# Patient Record
Sex: Female | Born: 1985 | Hispanic: No | Marital: Married | Smoking: Never smoker
Health system: Southern US, Community
[De-identification: ages and names within clinical notes are randomized; demographics above are authoritative.]

## PROBLEM LIST (undated history)

## (undated) ENCOUNTER — Inpatient Hospital Stay (HOSPITAL_COMMUNITY): Payer: Self-pay

## (undated) DIAGNOSIS — O139 Gestational [pregnancy-induced] hypertension without significant proteinuria, unspecified trimester: Secondary | ICD-10-CM

## (undated) DIAGNOSIS — I10 Essential (primary) hypertension: Secondary | ICD-10-CM

## (undated) DIAGNOSIS — G44209 Tension-type headache, unspecified, not intractable: Secondary | ICD-10-CM

## (undated) DIAGNOSIS — G43109 Migraine with aura, not intractable, without status migrainosus: Principal | ICD-10-CM

## (undated) HISTORY — DX: Gestational (pregnancy-induced) hypertension without significant proteinuria, unspecified trimester: O13.9

## (undated) HISTORY — DX: Tension-type headache, unspecified, not intractable: G44.209

## (undated) HISTORY — PX: MULTIPLE TOOTH EXTRACTIONS: SHX2053

## (undated) HISTORY — DX: Migraine with aura, not intractable, without status migrainosus: G43.109

---

## 2012-02-16 ENCOUNTER — Emergency Department (HOSPITAL_COMMUNITY)
Admission: EM | Admit: 2012-02-16 | Discharge: 2012-02-17 | Disposition: A | Discharge status: Home / Self Care | Payer: Medicaid Other | Attending: Emergency Medicine | Admitting: Emergency Medicine

## 2012-02-16 ENCOUNTER — Emergency Department (HOSPITAL_COMMUNITY): Payer: Medicaid Other

## 2012-02-16 ENCOUNTER — Encounter (HOSPITAL_COMMUNITY): Payer: Self-pay | Admitting: Emergency Medicine

## 2012-02-16 DIAGNOSIS — M545 Low back pain, unspecified: Secondary | ICD-10-CM | POA: Insufficient documentation

## 2012-02-16 DIAGNOSIS — M79609 Pain in unspecified limb: Secondary | ICD-10-CM | POA: Insufficient documentation

## 2012-02-16 DIAGNOSIS — R109 Unspecified abdominal pain: Secondary | ICD-10-CM | POA: Insufficient documentation

## 2012-02-16 DIAGNOSIS — M533 Sacrococcygeal disorders, not elsewhere classified: Secondary | ICD-10-CM | POA: Insufficient documentation

## 2012-02-16 DIAGNOSIS — K5900 Constipation, unspecified: Secondary | ICD-10-CM | POA: Insufficient documentation

## 2012-02-16 LAB — CBC
HCT: 38.6 % (ref 36.0–46.0)
MCHC: 35.5 g/dL (ref 30.0–36.0)
MCV: 82.1 fL (ref 78.0–100.0)
Platelets: 273 10*3/uL (ref 150–400)
RDW: 12.2 % (ref 11.5–15.5)
WBC: 8.2 10*3/uL (ref 4.0–10.5)

## 2012-02-16 LAB — URINALYSIS, ROUTINE W REFLEX MICROSCOPIC
Nitrite: NEGATIVE
Protein, ur: NEGATIVE mg/dL
Specific Gravity, Urine: 1.013 (ref 1.005–1.030)
Urobilinogen, UA: 0.2 mg/dL (ref 0.0–1.0)

## 2012-02-16 LAB — BASIC METABOLIC PANEL
CO2: 22 mEq/L (ref 19–32)
Calcium: 9.2 mg/dL (ref 8.4–10.5)
Creatinine, Ser: 0.66 mg/dL (ref 0.50–1.10)
GFR calc Af Amer: >90 mL/min (ref >90)
GFR calc non Af Amer: >90 mL/min (ref >90)
Sodium: 140 mEq/L (ref 135–145)

## 2012-02-16 LAB — DIFFERENTIAL
Basophils Absolute: 0 10*3/uL (ref 0.0–0.1)
Basophils Relative: 0 % (ref 0–1)
Eosinophils Relative: 9 % — ABNORMAL HIGH (ref 0–5)
Lymphocytes Relative: 44 % (ref 12–46)
Monocytes Absolute: 0.5 10*3/uL (ref 0.1–1.0)
Neutro Abs: 3.3 10*3/uL (ref 1.7–7.7)

## 2012-02-16 LAB — PREGNANCY, URINE: Preg Test, Ur: NEGATIVE

## 2012-02-16 MED ORDER — KETOROLAC TROMETHAMINE 60 MG/2ML IM SOLN
60.0000 mg | Freq: Once | INTRAMUSCULAR | Status: AC
  Administered 2012-02-16: 60 mg via INTRAMUSCULAR
  Filled 2012-02-16: qty 2

## 2012-02-16 MED ORDER — METHOCARBAMOL 500 MG PO TABS: 500.0000 mg | ORAL_TABLET | Freq: Two times a day (BID) | ORAL | Status: DC

## 2012-02-16 MED ORDER — TRAMADOL HCL 50 MG PO TABS: 50.0000 mg | ORAL_TABLET | Freq: Four times a day (QID) | ORAL | Status: DC | PRN

## 2012-02-16 MED ORDER — POLYETHYLENE GLYCOL 3350 17 GM/SCOOP PO POWD: 17.0000 g | Freq: Every day | ORAL | Status: AC

## 2012-02-16 MED ORDER — POLYETHYLENE GLYCOL 3350 17 GM/SCOOP PO POWD: 17.0000 g | Freq: Every day | ORAL | Status: DC

## 2012-02-16 MED ORDER — TRAMADOL HCL 50 MG PO TABS: 50.0000 mg | ORAL_TABLET | Freq: Four times a day (QID) | ORAL | Status: AC | PRN

## 2012-02-16 MED ORDER — IOHEXOL 300 MG/ML  SOLN
100.0000 mL | Freq: Once | INTRAMUSCULAR | Status: AC | PRN
  Administered 2012-02-16: 100 mL via INTRAVENOUS

## 2012-02-16 MED ORDER — IBUPROFEN 600 MG PO TABS: 600.0000 mg | ORAL_TABLET | Freq: Four times a day (QID) | ORAL | Status: DC | PRN

## 2012-02-16 MED ORDER — METHOCARBAMOL 500 MG PO TABS
500.0000 mg | ORAL_TABLET | Freq: Once | ORAL | Status: AC
  Administered 2012-02-16: 500 mg via ORAL
  Filled 2012-02-16: qty 1

## 2012-02-16 MED ORDER — OXYCODONE-ACETAMINOPHEN 5-325 MG PO TABS
1.0000 | ORAL_TABLET | Freq: Once | ORAL | Status: AC
  Administered 2012-02-16: 1 via ORAL
  Filled 2012-02-16: qty 1

## 2012-02-16 MED ORDER — IBUPROFEN 600 MG PO TABS: 600.0000 mg | ORAL_TABLET | Freq: Four times a day (QID) | ORAL | Status: AC | PRN

## 2012-02-16 MED ORDER — METHOCARBAMOL 500 MG PO TABS: 500.0000 mg | ORAL_TABLET | Freq: Two times a day (BID) | ORAL | Status: AC

## 2012-02-16 NOTE — ED Notes (Signed)
Called CT, waiting on lab results.

## 2012-02-16 NOTE — ED Provider Notes (Signed)
History     CSN: [id number]  Arrival date & time 02/16/12  1502   First MD Initiated Contact with Patient 02/16/12 1627      Chief Complaint  Patient presents with  . Abdominal Pain  . Back Pain    (Consider location/radiation/quality/duration/timing/severity/associated sxs/prior treatment) HPI Pt c/o lower back pain in band like pattern that radiates down the back of both lower ext to post thigh. Pain started 5 days ago and she states she has been seen once in an ED for same complaint. She is currently not taking any pain meds at home. No fever, chills, weakness, sensory loss, urinary symptoms, incontinence History reviewed. No pertinent past medical history.  History reviewed. No pertinent past surgical history.  History reviewed. No pertinent family history.  History  Substance Use Topics  . Smoking status: Never Smoker   . Smokeless tobacco: Not on file  . Alcohol Use: No    OB History    Grav Para Term Preterm Abortions TAB SAB Ect Mult Living                  Review of Systems  Constitutional: Negative for fever and chills.  Gastrointestinal: Negative for nausea, vomiting and abdominal pain.  Genitourinary: Negative for dysuria, hematuria, flank pain and difficulty urinating.  Musculoskeletal: Positive for back pain.  Skin: Negative for rash and wound.  Neurological: Negative for weakness and numbness.    Allergies  Review of patient's allergies indicates no known allergies.  Home Medications   Current Outpatient Rx  Name Route Sig Dispense Refill  . PRESCRIPTION MEDICATION Intramuscular Inject 1 Syringe into the muscle once. Injection for pain at MD's office    . IBUPROFEN 600 MG PO TABS Oral Take 1 tablet (600 mg total) by mouth every 6 (six) hours as needed for pain. 30 tablet 0  . METHOCARBAMOL 500 MG PO TABS Oral Take 1 tablet (500 mg total) by mouth 2 (two) times daily. 20 tablet 0  . POLYETHYLENE GLYCOL 3350 PO POWD Oral Take 17 g by mouth daily.  255 g 0  . TRAMADOL HCL 50 MG PO TABS Oral Take 1 tablet (50 mg total) by mouth every 6 (six) hours as needed for pain. 15 tablet 0    BP 137/96  Pulse 79  Temp(Src) 98.3 F (36.8 C) (Oral)  Resp 14  SpO2 98%  Physical Exam  Nursing note and vitals reviewed. Constitutional: She is oriented to person, place, and time. She appears well-developed and well-nourished. No distress.  HENT:  Head: Normocephalic and atraumatic.  Mouth/Throat: Oropharynx is clear and moist.  Eyes: EOM are normal. Pupils are equal, round, and reactive to light.  Neck: Normal range of motion. Neck supple.  Cardiovascular: Normal rate and regular rhythm.   Pulmonary/Chest: Effort normal and breath sounds normal. No respiratory distress. She has no wheezes. She has no rales.  Abdominal: Soft. Bowel sounds are normal. There is no tenderness. There is no rebound and no guarding.  Musculoskeletal: Normal range of motion. She exhibits no edema. Tenderness: mild tenderness ttp over lower back in lumbar and sacroilliac region. no focality. Negative straight leg raise.  Neurological: She is alert and oriented to person, place, and time.       5/5 motor, sensation intact, no saddle anesthesia  Skin: Skin is warm and dry. No rash noted. No erythema.  Psychiatric: She has a normal mood and affect. Her behavior is normal.    ED Course  Procedures (including critical care  time)  Labs Reviewed  DIFFERENTIAL - Abnormal; Notable for the following:    Neutrophils Relative 41 (*)    Eosinophils Relative 9 (*)    All other components within normal limits  BASIC METABOLIC PANEL - Abnormal; Notable for the following:    Potassium 3.2 (*)    All other components within normal limits  URINALYSIS, ROUTINE W REFLEX MICROSCOPIC  PREGNANCY, URINE  CBC  LAB REPORT - SCANNED   Ct Abdomen Pelvis W Contrast  02/16/2012  *RADIOLOGY REPORT*  Clinical Data: Lower abdominal pain  CT ABDOMEN AND PELVIS WITH CONTRAST  Technique:   Multidetector CT imaging of the abdomen and pelvis was performed following the standard protocol during bolus administration of intravenous contrast.  Contrast: OMNIPAQUE IOHEXOL 300 MG/ML  SOLN  Comparison: None.  Findings: Limited images through the lung bases demonstrate no significant appreciable abnormality. The heart size is within normal limits. No pleural or pericardial effusion.  Calcified granuloma right middle lobe.  Hypoattenuation adjacent to the falciform ligament is likely focal fat are very perfusion.  Unremarkable biliary system, spleen, pancreas, adrenal glands.  Symmetric renal enhancement.  No hydronephrosis or hydroureter.  No bowel obstruction.  No CT evidence for colitis.  Appendix within normal limits.  Thin-walled bladder.  Nonspecific appearance to the uterus and adnexa with physiologic cyst on the left.  There may be a corpus luteal cyst on the right.  No free intraperitoneal air or fluid.  Normal caliber vasculature.  No acute osseous abnormality.  IMPRESSION: No acute abnormality identified by CT.  Original Report Authenticated By: Waneta Martins, M.D.     1. Low back pain radiating to both legs   2. Constipation       MDM  Pt resting comfortably. Pain improved. Will d/c with symptomatic rx for symptomatic relief. No concerning findings for back pain.   After pt initially d/c'd, pt tells nurse she is having RLQ pain and has history of appendicitis without appendectomy. Though given the chronic nature of the pain and low likelihood of surgical issue, will get basic labs and CT abd. If neg will proceed with d/c      Loren Racer, MD 02/18/12 [phone]

## 2012-02-16 NOTE — ED Notes (Signed)
NP to bedside

## 2012-02-16 NOTE — ED Notes (Addendum)
Pt c/o lower back pain with pain in lower abd and bilateral legs; pt sts seen for same in past; pt sts trouble walking

## 2012-02-16 NOTE — ED Notes (Signed)
Pt. Reports low back pain and lower abdominal pain that radiates down legs. Rebound tenderness in lower right quadrant. Denies N/V/D, pain with urination. States this started last Thursday and went to urgent care Monday where she was treated but reports persisting pain.

## 2012-02-16 NOTE — ED Notes (Signed)
Prescriptions X4 given with discharge instructions. 

## 2012-02-16 NOTE — ED Notes (Signed)
Per visitors, pt. Has a history of appendicitis and was going to have appendix removed before she moved here, but it was never done. States she is complaining of similar symptoms. Will notify doctor.

## 2012-02-16 NOTE — ED Notes (Signed)
Pt. Ambulatory to bathroom with moderate assistance. Urine sample collected and labeled. Pt. Changed into a gown and resting comfortably. Call light within reach.

## 2012-02-16 NOTE — Discharge Instructions (Signed)
Back Pain, Adult  Low back pain is very common. About 1 in 5 people have back pain.The cause of low back pain is rarely dangerous. The pain often gets better over time.About half of people with a sudden onset of back pain feel better in just 2 weeks. About 8 in 10 people feel better by 6 weeks.   CAUSES  Some common causes of back pain include:   Strain of the muscles or ligaments supporting the spine.   Wear and tear (degeneration) of the spinal discs.   Arthritis.   Direct injury to the back.  DIAGNOSIS  Most of the time, the direct cause of low back pain is not known.However, back pain can be treated effectively even when the exact cause of the pain is unknown.Answering your caregiver's questions about your overall health and symptoms is one of the most accurate ways to make sure the cause of your pain is not dangerous. If your caregiver needs more information, he or she may order lab work or imaging tests (X-rays or MRIs).However, even if imaging tests show changes in your back, this usually does not require surgery.  HOME CARE INSTRUCTIONS  For many people, back pain returns.Since low back pain is rarely dangerous, it is often a condition that people can learn to manageon their own.    Remain active. It is stressful on the back to sit or stand in one place. Do not sit, drive, or stand in one place for more than 30 minutes at a time. Take short walks on level surfaces as soon as pain allows.Try to increase the length of time you walk each day.   Do not stay in bed.Resting more than 1 or 2 days can delay your recovery.   Do not avoid exercise or work.Your body is made to move.It is not dangerous to be active, even though your back may hurt.Your back will likely heal faster if you return to being active before your pain is gone.   Pay attention to your body when you bend and lift. Many people have less discomfortwhen lifting if they bend their knees, keep the load close to their  bodies,and avoid twisting. Often, the most comfortable positions are those that put less stress on your recovering back.   Find a comfortable position to sleep. Use a firm mattress and lie on your side with your knees slightly bent. If you lie on your back, put a pillow under your knees.   Only take over-the-counter or prescription medicines as directed by your caregiver. Over-the-counter medicines to reduce pain and inflammation are often the most helpful.Your caregiver may prescribe muscle relaxant drugs.These medicines help dull your pain so you can more quickly return to your normal activities and healthy exercise.   Put ice on the injured area.   Put ice in a plastic bag.   Place a towel between your skin and the bag.   Leave the ice on for 15 to 20 minutes, 3 to 4 times a day for the first 2 to 3 days. After that, ice and heat may be alternated to reduce pain and spasms.   Ask your caregiver about trying back exercises and gentle massage. This may be of some benefit.   Avoid feeling anxious or stressed.Stress increases muscle tension and can worsen back pain.It is important to recognize when you are anxious or stressed and learn ways to manage it.Exercise is a great option.  SEEK MEDICAL CARE IF:   You have pain that is not   relieved with rest or medicine.   You have pain that does not improve in 1 week.   You have new symptoms.   You are generally not feeling well.  SEEK IMMEDIATE MEDICAL CARE IF:    You have pain that radiates from your back into your legs.   You develop new bowel or bladder control problems.   You have unusual weakness or numbness in your arms or legs.   You develop nausea or vomiting.   You develop abdominal pain.   You feel faint.  Document Released: 09/13/2005 Document Revised: 09/02/2011 Document Reviewed: 02/01/2011  Exit[employer] Patient Information 2012 [employer]  Constipation in Adults  Constipation is having fewer than 2 bowel movements per week. Usually,  the stools are hard. As we grow older, constipation is more common. If you try to fix constipation with laxatives, the problem may get worse. This is because laxatives taken over a long period of time make the colon muscles weaker. A low-fiber diet, not taking in enough fluids, and taking some medicines may make these problems worse.  MEDICATIONS THAT MAY CAUSE CONSTIPATION   Water pills (diuretics).   Calcium channel blockers (used to control blood pressure and for the heart).   Certain pain medicines (narcotics).   Anticholinergics.   Anti-inflammatory agents.   Antacids that contain aluminum.  DISEASES THAT CONTRIBUTE TO CONSTIPATION   Diabetes.   Parkinson's disease.   Dementia.   Stroke.   Depression.   Illnesses that cause problems with salt and water metabolism.  HOME CARE INSTRUCTIONS    Constipation is usually best cared for without medicines. Increasing dietary fiber and eating more fruits and vegetables is the best way to manage constipation.   Slowly increase fiber intake to 25 to 38 grams per day. Whole grains, fruits, vegetables, and legumes are good sources of fiber. A dietitian can further help you incorporate high-fiber foods into your diet.   Drink enough water and fluids to keep your urine clear or pale yellow.   A fiber supplement may be added to your diet if you cannot get enough fiber from foods.   Increasing your activities also helps improve regularity.   Suppositories, as suggested by your caregiver, will also help. If you are using antacids, such as aluminum or calcium containing products, it will be helpful to switch to products containing magnesium if your caregiver says it is okay.   If you have been given a liquid injection (enema) today, this is only a temporary measure. It should not be relied on for treatment of longstanding (chronic) constipation.   Stronger measures, such as magnesium sulfate, should be avoided if possible. This may cause uncontrollable  diarrhea. Using magnesium sulfate may not allow you time to make it to the bathroom.  SEEK IMMEDIATE MEDICAL CARE IF:    There is bright red blood in the stool.   The constipation stays for more than 4 days.   There is belly (abdominal) or rectal pain.   You do not seem to be getting better.   You have any questions or concerns.  MAKE SURE YOU:    Understand these instructions.   Will watch your condition.   Will get help right away if you are not doing well or get worse.  Document Released: 06/11/2004 Document Revised: 09/02/2011 Document Reviewed: 08/17/2011  [employer] Patient Information 2012 [employer].

## 2012-08-18 ENCOUNTER — Emergency Department (INDEPENDENT_AMBULATORY_CARE_PROVIDER_SITE_OTHER)
Admission: EM | Admit: 2012-08-18 | Discharge: 2012-08-18 | Disposition: A | Discharge status: Home / Self Care | Payer: Self-pay | Source: Home / Self Care | Attending: Emergency Medicine | Admitting: Emergency Medicine

## 2012-08-18 ENCOUNTER — Encounter (HOSPITAL_COMMUNITY): Payer: Self-pay | Admitting: Emergency Medicine

## 2012-08-18 DIAGNOSIS — M549 Dorsalgia, unspecified: Secondary | ICD-10-CM

## 2012-08-18 DIAGNOSIS — Z3201 Encounter for pregnancy test, result positive: Secondary | ICD-10-CM

## 2012-08-18 DIAGNOSIS — Z3490 Encounter for supervision of normal pregnancy, unspecified, unspecified trimester: Secondary | ICD-10-CM

## 2012-08-18 DIAGNOSIS — R110 Nausea: Secondary | ICD-10-CM

## 2012-08-18 LAB — POCT PREGNANCY, URINE: Preg Test, Ur: POSITIVE — AB

## 2012-08-18 LAB — POCT URINALYSIS DIP (DEVICE)
Glucose, UA: NEGATIVE mg/dL
Leukocytes, UA: NEGATIVE
Nitrite: NEGATIVE
Protein, ur: NEGATIVE mg/dL
Urobilinogen, UA: 0.2 mg/dL (ref 0.0–1.0)

## 2012-08-18 MED ORDER — PRENATAL LOW IRON 27-0.8 MG PO TABS: 1.0000 | ORAL_TABLET | Freq: Every morning | ORAL | Status: DC

## 2012-08-18 NOTE — ED Notes (Signed)
Pt c/o poss preg... LMP was on 07/11/12... Sx include: lower back pain, nauseas, vomiting, headaches... Denies: diarrhea... Pt is alert w/no signs of distress.

## 2012-08-18 NOTE — ED Provider Notes (Signed)
History     CSN: [id number]  Arrival date & time 08/18/12  0915   First MD Initiated Contact with Patient 08/18/12 [phone]      Chief Complaint  Patient presents with  . Possible Pregnancy    (Consider location/radiation/quality/duration/timing/severity/associated sxs/prior treatment) HPI Comments: Patient presents urgent care complaining of ongoing back pains. They're not constant they come and go. She is also concerned as she might be pregnant her last menstrual. Was on October 15. She is somewhat nauseous in the mornings and have vomited a couple times. Denies any fevers, burning with urination, weight loss. Patient describes she has one child and had some blood pressure issues during pregnancy. She has not had high blood pressure since her delivery. Patient denies any other symptomatology such as abdominal pain, visual disturbances, weakness or paresthesias.  The history is provided by the patient.    Past Medical History  Diagnosis Date  . Hypertension     History reviewed. No pertinent past surgical history.  No family history on file.  History  Substance Use Topics  . Smoking status: Never Smoker   . Smokeless tobacco: Not on file  . Alcohol Use: No    OB History    Grav Para Term Preterm Abortions TAB SAB Ect Mult Living                  Review of Systems  Constitutional: Negative for chills, activity change and appetite change.  Respiratory: Negative for shortness of breath.   Cardiovascular: Negative for chest pain.  Gastrointestinal: Positive for nausea. Negative for vomiting, abdominal pain and diarrhea.  Genitourinary: Negative for urgency, frequency, flank pain, vaginal discharge, vaginal pain and pelvic pain.  Musculoskeletal: Positive for back pain.  Skin: Negative for rash.  Neurological: Negative for dizziness and headaches.    Allergies  Review of patient's allergies indicates no known allergies.  Home Medications   Current Outpatient Rx    Name  Route  Sig  Dispense  Refill  . PRENATAL LOW IRON 27-0.8 MG PO TABS   Oral   Take 1 tablet by mouth every morning.   30 each   0   . PRESCRIPTION MEDICATION   Intramuscular   Inject 1 Syringe into the muscle once. Injection for pain at MD's office           BP 122/70  Pulse 70  Temp 98.2 F (36.8 C) (Oral)  Resp 18  SpO2 100%  LMP 07/11/2012  Physical Exam  Nursing note and vitals reviewed. Constitutional: She appears well-developed and well-nourished.  HENT:  Head: Normocephalic.  Eyes: Conjunctivae normal are normal. No scleral icterus.  Pulmonary/Chest: Effort normal and breath sounds normal. She has no decreased breath sounds.  Abdominal: Soft. She exhibits no mass. There is no tenderness. There is no rebound and no guarding.  Genitourinary: No vaginal discharge found.  Musculoskeletal:       Back:  Neurological: She is alert.  Skin: No rash noted. No erythema.    ED Course  Procedures (including critical care time)  Labs Reviewed  POCT PREGNANCY, URINE - Abnormal; Notable for the following:    Preg Test, Ur POSITIVE (*)     All other components within normal limits  POCT URINALYSIS DIP (DEVICE)   No results found.   1. Pregnancy      Informational ( non-diagnostic) transabdominal ultrasound revealing a suspected intrauterine pregnancy- early less than 6 weeks MDM   Patient was started on prenatal vitamins and provider referral  to establish prenatal care at [facility] outpatient clinic. Anticipatory guidance about what symptoms should warrant her to [facility]. Patient understands and will call provider number to establish prenatal care.       Jimmie Molly, MD 08/18/12 1218

## 2012-08-23 ENCOUNTER — Encounter (HOSPITAL_COMMUNITY): Payer: Self-pay | Admitting: *Deleted

## 2012-08-23 ENCOUNTER — Inpatient Hospital Stay (HOSPITAL_COMMUNITY)
Admission: AD | Admit: 2012-08-23 | Discharge: 2012-08-23 | Disposition: A | Discharge status: Home / Self Care | Payer: Medicaid Other | Source: Ambulatory Visit | Attending: Family Medicine | Admitting: Family Medicine

## 2012-08-23 ENCOUNTER — Inpatient Hospital Stay (HOSPITAL_COMMUNITY): Payer: Medicaid Other

## 2012-08-23 DIAGNOSIS — O99891 Other specified diseases and conditions complicating pregnancy: Secondary | ICD-10-CM | POA: Insufficient documentation

## 2012-08-23 DIAGNOSIS — R42 Dizziness and giddiness: Secondary | ICD-10-CM | POA: Insufficient documentation

## 2012-08-23 DIAGNOSIS — Z3490 Encounter for supervision of normal pregnancy, unspecified, unspecified trimester: Secondary | ICD-10-CM

## 2012-08-23 DIAGNOSIS — R109 Unspecified abdominal pain: Secondary | ICD-10-CM | POA: Insufficient documentation

## 2012-08-23 DIAGNOSIS — O26899 Other specified pregnancy related conditions, unspecified trimester: Secondary | ICD-10-CM

## 2012-08-23 LAB — URINALYSIS, ROUTINE W REFLEX MICROSCOPIC
Leukocytes, UA: NEGATIVE
Nitrite: NEGATIVE
Specific Gravity, Urine: <1.005 — ABNORMAL LOW (ref 1.005–1.030)
pH: 6 (ref 5.0–8.0)

## 2012-08-23 LAB — CBC
HCT: 35 % — ABNORMAL LOW (ref 36.0–46.0)
Hemoglobin: 12.2 g/dL (ref 12.0–15.0)
MCH: 28.4 pg (ref 26.0–34.0)
MCHC: 34.9 g/dL (ref 30.0–36.0)
MCV: 81.6 fL (ref 78.0–100.0)

## 2012-08-23 LAB — HCG, QUANTITATIVE, PREGNANCY: hCG, Beta Chain, Quant, S: 45922 m[IU]/mL — ABNORMAL HIGH (ref <5)

## 2012-08-23 LAB — WET PREP, GENITAL: Yeast Wet Prep HPF POC: NONE SEEN

## 2012-08-23 NOTE — MAU Note (Signed)
Abdominal pain since 11/17. Dizzy "for a long time." States she is able to eat and drink. States she has been seen at [facility].

## 2012-08-23 NOTE — MAU Provider Note (Signed)
Chart reviewed and agree with management and plan.  

## 2012-08-23 NOTE — MAU Note (Signed)
Pt states she is pregnant, has had a lot of dizziness, has vomited x2 since beginning of pregnancy. Denies abnormal vaginal discharge or bleeding. Notes lower abd pain at suprapubic area.

## 2012-08-23 NOTE — MAU Provider Note (Signed)
History     CSN: [id number]  Arrival date and time: 08/23/12 4098   First Provider Initiated Contact with Patient 08/23/12 [phone]      Chief Complaint  Patient presents with  . Abdominal Pain   HPI Carrie Duncan 26 y.o. [redacted]w[redacted]d  Client comes to MAU today with abdominal pain and dizziness with this pregnancy.  Was seen at the ER last week and an informal ultrasound was done.  Is taking prenatal vitamins.    OB History    Grav Para Term Preterm Abortions TAB SAB Ect Mult Living   3 1 1  1 1    1       Past Medical History  Diagnosis Date  . Hypertension     Past Surgical History  Procedure Date  . No past surgeries     History reviewed. No pertinent family history.  History  Substance Use Topics  . Smoking status: Never Smoker   . Smokeless tobacco: Never Used  . Alcohol Use: No    Allergies: No Known Allergies  Prescriptions prior to admission  Medication Sig Dispense Refill  . acetaminophen (TYLENOL) 500 MG tablet Take 1,000 mg by mouth every 6 (six) hours as needed. For pain      . Prenatal Vit-Fe Fumarate-FA (PRENATAL MULTIVITAMIN) TABS Take 1 tablet by mouth daily.        Review of Systems  Constitutional: Negative for fever.  Gastrointestinal: Positive for nausea and abdominal pain. Negative for vomiting, diarrhea and constipation.  Genitourinary:       No vaginal discharge. No vaginal bleeding. No dysuria.  Neurological: Positive for dizziness.   Physical Exam   Blood pressure 127/75, pulse 70, temperature 97.8 F (36.6 C), temperature source Oral, resp. rate 18, height 5\' 2"  (1.575 m), weight 60.328 kg (133 lb), last menstrual period 07/11/2012, SpO2 100.00%.  Physical Exam  Nursing note and vitals reviewed. Constitutional: She is oriented to person, place, and time. She appears well-developed and well-nourished.  HENT:  Head: Normocephalic.  Eyes: EOM are normal.  Neck: Neck supple.  GI: Soft. Bowel sounds are normal. There is tenderness.  There is no rebound and no guarding.  Genitourinary:       Speculum exam: Vulva - negative Vagina - minimal creamy discharge, no odor Cervix - No contact bleeding Bimanual exam: Cervix closed Uterus  tender, 6 week size Adnexa mildly tender with palpation, no masses bilaterally GC/Chlam, wet prep done Chaperone present for exam.  Musculoskeletal: Normal range of motion.  Neurological: She is alert and oriented to person, place, and time.  Skin: Skin is warm and dry.  Psychiatric: She has a normal mood and affect.    MAU Course  Procedures Results for orders placed during the hospital encounter of 08/23/12 (from the past 24 hour(s))  URINALYSIS, ROUTINE W REFLEX MICROSCOPIC     Status: Abnormal   Collection Time   08/23/12  8:40 AM      Component Value Range   Color, Urine YELLOW  YELLOW   APPearance CLEAR  CLEAR   Specific Gravity, Urine <1.005 (*) 1.005 - 1.030   pH 6.0  5.0 - 8.0   Glucose, UA NEGATIVE  NEGATIVE mg/dL   Hgb urine dipstick NEGATIVE  NEGATIVE   Bilirubin Urine NEGATIVE  NEGATIVE   Ketones, ur NEGATIVE  NEGATIVE mg/dL   Protein, ur NEGATIVE  NEGATIVE mg/dL   Urobilinogen, UA 0.2  0.0 - 1.0 mg/dL   Nitrite NEGATIVE  NEGATIVE   Leukocytes, UA NEGATIVE  NEGATIVE  WET PREP, GENITAL     Status: Abnormal   Collection Time   08/23/12 10:08 AM      Component Value Range   Yeast Wet Prep HPF POC NONE SEEN  NONE SEEN   Trich, Wet Prep NONE SEEN  NONE SEEN   Clue Cells Wet Prep HPF POC NONE SEEN  NONE SEEN   WBC, Wet Prep HPF POC FEW (*) NONE SEEN  HCG, QUANTITATIVE, PREGNANCY     Status: Abnormal   Collection Time   08/23/12 10:20 AM      Component Value Range   hCG, Beta Chain, Sharene Butters, [place] [address] (*) <5 mIU/mL  CBC     Status: Abnormal   Collection Time   08/23/12 10:20 AM      Component Value Range   WBC 6.3  4.0 - 10.5 K/uL   RBC 4.29  3.87 - 5.11 MIL/uL   Hemoglobin 12.2  12.0 - 15.0 g/dL   HCT 60.4 (*) 54.0 - 98.1 %   MCV 81.6  78.0 - 100.0 fL   MCH  28.4  26.0 - 34.0 pg   MCHC 34.9  30.0 - 36.0 g/dL   RDW 19.1  47.8 - 29.5 %   Platelets 262  150 - 400 K/uL  ABO/RH     Status: Normal (Preliminary result)   Collection Time   08/23/12 10:20 AM      Component Value Range   ABO/RH(D) O POS     MDM Diet review with client - eating high carb diet.  Suspect she needs to eat small amounts every 2-3 hours and eat protein with each meal to decrease dizziness.  Discussed some dizziness is normal in pregnancy especially when moving quickly.  Advised to move slowly with position changes.  Assessment and Plan  abd pain in pregnancy  Plan Eat every 2-3 hours with protein (eggs, meat, cheese, yogurt, milk, beans, peanut butter or nuts) every time you eat. Change positions slowly Drink at least 8 8-oz glasses of water every day. Take Tylenol 325 mg 2 tablets by mouth every 4 hours if needed for pain. Begin prenatal care as soon as possible Continue the prenatal vitamins that you have - one every day.  Rupert Azzara 08/23/2012, 10:23 AM

## 2012-08-25 LAB — GC/CHLAMYDIA PROBE AMP
CT Probe RNA: NEGATIVE
GC Probe RNA: NEGATIVE

## 2012-09-27 NOTE — L&D Delivery Note (Addendum)
Delivery Note At 7:07 PM a viable female was delivered via Vaginal, Spontaneous Delivery (Presentation: Left Occiput Anterior).  APGAR: 3, 9; weight .   Placenta status: Intact, Spontaneous.  Cord: 3 vessels with the following complications: None.  Cord pH: Pending  Anesthesia: None  Episiotomy: None Lacerations: labial right Suture Repair: None Est. Blood Loss (mL): 250  Mom to postpartum.  Baby to nursery-stable.  Tawana Scale 04/13/2013, 7:33 PM

## 2012-09-30 ENCOUNTER — Emergency Department (HOSPITAL_COMMUNITY)
Admission: EM | Admit: 2012-09-30 | Discharge: 2012-09-30 | Disposition: A | Discharge status: Home / Self Care | Payer: Medicaid Other | Source: Home / Self Care

## 2012-09-30 ENCOUNTER — Encounter (HOSPITAL_COMMUNITY): Payer: Self-pay | Admitting: Emergency Medicine

## 2012-09-30 DIAGNOSIS — R55 Syncope and collapse: Secondary | ICD-10-CM

## 2012-09-30 DIAGNOSIS — Z3490 Encounter for supervision of normal pregnancy, unspecified, unspecified trimester: Secondary | ICD-10-CM

## 2012-09-30 DIAGNOSIS — L03011 Cellulitis of right finger: Secondary | ICD-10-CM

## 2012-09-30 DIAGNOSIS — L03019 Cellulitis of unspecified finger: Secondary | ICD-10-CM

## 2012-09-30 MED ORDER — CEPHALEXIN 500 MG PO CAPS: 500.0000 mg | ORAL_CAPSULE | Freq: Three times a day (TID) | ORAL | Status: DC

## 2012-09-30 NOTE — ED Notes (Signed)
Pt c/o right thumb pain around cuticle x 3 weeks. Pt denies injury. Thumb is slightly swollen at cuticle. Pt has not tried any meds/treatment.

## 2012-09-30 NOTE — ED Notes (Signed)
Waiting discharge papers 

## 2012-09-30 NOTE — ED Provider Notes (Signed)
History     CSN: [id number]  Arrival date & time 09/30/12  1343   None     Chief Complaint  Patient presents with  . Finger Injury    right thumb pain  x 3 wks. around cuticle     (Consider location/radiation/quality/duration/timing/severity/associated sxs/prior treatment) HPI Comments: 27 year old female who presents with soreness around the base of the right thumbnail. This began approximately 3 weeks ago. She denies any known trauma. Denies symptoms involving the other digits and denies constitutional/systemic symptoms. She is pregnant.   Past Medical History  Diagnosis Date  . Hypertension     Past Surgical History  Procedure Date  . No past surgeries     History reviewed. No pertinent family history.  History  Substance Use Topics  . Smoking status: Never Smoker   . Smokeless tobacco: Never Used  . Alcohol Use: No    OB History    Grav Para Term Preterm Abortions TAB SAB Ect Mult Living   3 1 1  1 1    1       Review of Systems  All other systems reviewed and are negative.    Allergies  Review of patient's allergies indicates no known allergies.  Home Medications   Current Outpatient Rx  Name  Route  Sig  Dispense  Refill  . PRENATAL MULTIVITAMIN CH   Oral   Take 1 tablet by mouth daily.         . ACETAMINOPHEN 500 MG PO TABS   Oral   Take 1,000 mg by mouth every 6 (six) hours as needed. For pain         . CEPHALEXIN 500 MG PO CAPS   Oral   Take 1 capsule (500 mg total) by mouth 3 (three) times daily.   21 capsule   0     BP 123/89  Pulse 71  Temp 100 F (37.8 C) (Oral)  Resp 15  SpO2 100%  LMP 07/11/2012  Physical Exam  Constitutional: She is oriented to person, place, and time. She appears well-developed and well-nourished. No distress.  Pulmonary/Chest: Effort normal.  Musculoskeletal: Normal range of motion.  Neurological: She is alert and oriented to person, place, and time. She exhibits normal muscle tone.  Skin: Skin  is warm and dry. No rash noted.       Puffiness around the base of the cuticle as well as the ulnar aspect. No subungual hematoma, no swelling or redness to the distal phalanx.  Psychiatric: She has a normal mood and affect.    ED Course  INCISION AND DRAINAGE Date/Time: 09/30/2012 5:13 PM Performed by: Phineas Real, Makailey Hodgkin Authorized by: Phineas Real, Shaden Lacher Consent: Verbal consent obtained. Risks and benefits: risks, benefits and alternatives were discussed Consent given by: patient Patient identity confirmed: verbally with patient Type: abscess Body area: upper extremity Location details: right thumb Anesthesia: local infiltration Local anesthetic: lidocaine 2% without epinephrine Anesthetic total: 0.25 ml Patient sedated: no Scalpel size: 11 Incision type: single straight Complexity: simple Drainage: purulent and bloody Drainage amount: scant Wound treatment: wound left open Comments: See note below the patient tolerated procedure well but just 3-4 minutes afterward she did feel faint.   (including critical care time)  Labs Reviewed - No data to display No results found.   1. Paronychia of right thumb   2. Vasovagal near syncope   3. Pregnancy       MDM  Soak right thumb in warm salt water 2-3 times a day. Do that  tonight. Keflex 500 mg 3 times a day for 7 days Rest tonight. Approximately 3-4 minutes after releasing the paronychia the patient stopped responding and although she continued to have her eyes open and tracking bedside activity I laid her down and she stated she was feeling dizzy. She was having a vasovagal reaction to the procedure. She remained alert and conscious and her vital signs are within normal limits and stable. She will be given some fruit juice and crackers. She has not eaten any food since this morning. She is observed for approximately 15-20 minutes and then will be allowed to go home. The patient was discharged in stable condition. Her vital signs were stable  and within normal limits. She received her instructions for care of the paronychia post I&D.         Hayden Rasmussen, NP 09/30/12 1714  Hayden Rasmussen, NP 09/30/12 [place]  Hayden Rasmussen, NP 10/16/12 [phone]

## 2012-10-03 LAB — CULTURE, ROUTINE-ABSCESS

## 2012-10-10 ENCOUNTER — Other Ambulatory Visit: Payer: Self-pay

## 2012-10-10 ENCOUNTER — Encounter (HOSPITAL_COMMUNITY): Payer: Self-pay | Admitting: Emergency Medicine

## 2012-10-10 ENCOUNTER — Emergency Department (HOSPITAL_COMMUNITY)
Admission: EM | Admit: 2012-10-10 | Discharge: 2012-10-10 | Disposition: A | Discharge status: Home / Self Care | Payer: Medicaid Other | Attending: Emergency Medicine | Admitting: Emergency Medicine

## 2012-10-10 DIAGNOSIS — O139 Gestational [pregnancy-induced] hypertension without significant proteinuria, unspecified trimester: Secondary | ICD-10-CM | POA: Insufficient documentation

## 2012-10-10 DIAGNOSIS — O9989 Other specified diseases and conditions complicating pregnancy, childbirth and the puerperium: Secondary | ICD-10-CM | POA: Insufficient documentation

## 2012-10-10 DIAGNOSIS — R55 Syncope and collapse: Secondary | ICD-10-CM | POA: Insufficient documentation

## 2012-10-10 DIAGNOSIS — R1012 Left upper quadrant pain: Secondary | ICD-10-CM | POA: Insufficient documentation

## 2012-10-10 HISTORY — DX: Gestational (pregnancy-induced) hypertension without significant proteinuria, unspecified trimester: O13.9

## 2012-10-10 LAB — COMPREHENSIVE METABOLIC PANEL
ALT: 10 U/L (ref 0–35)
AST: 21 U/L (ref 0–37)
Albumin: 3.7 g/dL (ref 3.5–5.2)
Alkaline Phosphatase: 42 U/L (ref 39–117)
Chloride: 102 mEq/L (ref 96–112)
Potassium: 3.3 mEq/L — ABNORMAL LOW (ref 3.5–5.1)
Total Bilirubin: 0.2 mg/dL — ABNORMAL LOW (ref 0.3–1.2)

## 2012-10-10 LAB — CBC WITH DIFFERENTIAL/PLATELET
Basophils Absolute: 0 10*3/uL (ref 0.0–0.1)
Basophils Relative: 0 % (ref 0–1)
Hemoglobin: 13.5 g/dL (ref 12.0–15.0)
MCHC: 36.8 g/dL — ABNORMAL HIGH (ref 30.0–36.0)
Monocytes Relative: 6 % (ref 3–12)
Neutro Abs: 5.7 10*3/uL (ref 1.7–7.7)
Neutrophils Relative %: 64 % (ref 43–77)
RDW: 13.8 % (ref 11.5–15.5)

## 2012-10-10 LAB — URINALYSIS, ROUTINE W REFLEX MICROSCOPIC
Bilirubin Urine: NEGATIVE
Glucose, UA: NEGATIVE mg/dL
Ketones, ur: NEGATIVE mg/dL
pH: 6.5 (ref 5.0–8.0)

## 2012-10-10 NOTE — ED Notes (Addendum)
Pt here via EMS with c/o witnessed syncopal episode today at dentist office. Pt reports LUQ abdominal pain 3/10. Pt states she is 3 months pregnant has gestational hypertension. Vitals per EMS BP 121/83, Pulse 74, RR 16, O2 100 on room  Air, CBG 85.

## 2012-10-10 NOTE — ED Notes (Signed)
Family at bedside. 

## 2012-10-10 NOTE — ED Provider Notes (Signed)
Medical screening examination/treatment/procedure(s) were performed by non-physician practitioner and as supervising physician I was immediately available for consultation/collaboration.   Dione Booze, MD 10/10/12 2146

## 2012-10-10 NOTE — ED Notes (Signed)
Rx x *.  Pt voiced understanding [facility] and return for worsening condition.

## 2012-10-10 NOTE — ED Provider Notes (Signed)
History     CSN: [id number]  Arrival date & time 10/10/12  1642   First MD Initiated Contact with Patient 10/10/12 1722      Chief Complaint  Patient presents with  . Loss of Consciousness    (Consider location/radiation/quality/duration/timing/severity/associated sxs/prior treatment) HPI Comments: Carrie Duncan is a 27 year old female patient with a history of gestational hypertension and G3P1010 who presents to the emergency department complaining of a syncopal episode that occurred today while she was at the dentist, where her daughter was being seen.  Medical staff came to talk to her after her daughter bit medical staffs finger and there was blood.  After hearing this information she began feeling dizzy and had a syncopal episode while sitting in a chair.  Medical staff at the dentists office witnessed the episode and assisted patient and called 911. She denies falling out of the chair.  Associated symptoms include left upper quadrant abdominal pain, which started after the syncopal episode, that the patient describes as "piercing".  She denies headaches, neck pain, chest pain, shortness of breath, nausea, vomiting, diarrhea, vaginal bleeding, burning on urination and lower extremity swelling or pain.  She had a syncopal episode three years ago and was told by her doctor that the episode was caused by her pregnancy induced hypertension. Patient states she became very hot and dizzy before passing out.  His a history of vasovagal syncope.  The history is provided by the patient, medical records and the spouse.    Past Medical History  Diagnosis Date  . Hypertension   . Gestational HTN     Past Surgical History  Procedure Date  . No past surgeries     History reviewed. No pertinent family history.  History  Substance Use Topics  . Smoking status: Never Smoker   . Smokeless tobacco: Never Used  . Alcohol Use: No    OB History    Grav Para Term Preterm Abortions TAB SAB  Ect Mult Living   3 1 1  1 1    1       Review of Systems  Constitutional: Negative for fever, diaphoresis, appetite change, fatigue and unexpected weight change.  HENT: Negative for mouth sores and neck stiffness.   Eyes: Negative for visual disturbance.  Respiratory: Negative for cough, chest tightness, shortness of breath and wheezing.   Cardiovascular: Positive for syncope. Negative for chest pain.  Gastrointestinal: Positive for abdominal pain. Negative for nausea, vomiting, diarrhea and constipation.  Genitourinary: Negative for dysuria, urgency, frequency and hematuria.  Skin: Negative for rash.  Neurological: Positive for syncope. Negative for light-headedness and headaches.  Psychiatric/Behavioral: Negative for sleep disturbance. The patient is not nervous/anxious.   All other systems reviewed and are negative.    Allergies  Review of patient's allergies indicates no known allergies.  Home Medications   Current Outpatient Rx  Name  Route  Sig  Dispense  Refill  . PRENATAL MULTIVITAMIN CH   Oral   Take 1 tablet by mouth daily.           BP 123/79  Pulse 85  Temp 98.8 F (37.1 C) (Oral)  Resp 18  SpO2 100%  LMP 07/11/2012  Physical Exam  Nursing note and vitals reviewed. Constitutional: She is oriented to person, place, and time. She appears well-developed and well-nourished. No distress.  HENT:  Head: Normocephalic and atraumatic.  Right Ear: External ear normal.  Left Ear: External ear normal.  Mouth/Throat: Oropharynx is clear and moist. No oropharyngeal  exudate.  Eyes: Conjunctivae normal are normal. Pupils are equal, round, and reactive to light. No scleral icterus.  Neck: Normal range of motion. Neck supple.  Cardiovascular: Normal rate, regular rhythm, normal heart sounds and intact distal pulses.  Exam reveals no gallop and no friction rub.   No murmur heard. Pulmonary/Chest: Effort normal and breath sounds normal. No respiratory distress. She has  no wheezes. She has no rales. She exhibits no tenderness.  Abdominal: Soft. Normal appearance and bowel sounds are normal. She exhibits no mass. There is tenderness in the left upper quadrant. There is no rigidity, no rebound, no guarding, no CVA tenderness, no tenderness at McBurney's point and negative Murphy's sign.  Musculoskeletal: Normal range of motion. She exhibits no edema and no tenderness.  Lymphadenopathy:    She has no cervical adenopathy.  Neurological: She is alert and oriented to person, place, and time. She exhibits normal muscle tone. Coordination normal.       Speech is clear and goal oriented Moves extremities without ataxia  Skin: Skin is warm and dry. No rash noted. She is not diaphoretic. No erythema.  Psychiatric: She has a normal mood and affect.    ED Course  Procedures (including critical care time)  Labs Reviewed  CBC WITH DIFFERENTIAL - Abnormal; Notable for the following:    MCHC 36.8 (*)     Eosinophils Relative 6 (*)     All other components within normal limits  COMPREHENSIVE METABOLIC PANEL - Abnormal; Notable for the following:    Potassium 3.3 (*)     Total Bilirubin 0.2 (*)     All other components within normal limits  LIPASE, BLOOD  URINALYSIS, ROUTINE W REFLEX MICROSCOPIC   No results found.   1. Vasovagal syncope       MDM  Karan Inclan presents with syncopal episode after being at the dentist office today.  Patient did not fall, she did not hit her head. She has a history of vasovagal syncope particularly with pregnancy.   Patient vitals have remained stable throughout time in the department. CMP with mild hypokalemia, lipase, urinalysis, CBC all unremarkable at this time.  Reevaluation patient is no longer tender in her abdomen at all.  Fetal heart tones found at 151.  Patient is without vaginal bleeding and there is no abdominal trauma.  Patient is not orthostatic.  Patient is not tachycardic, NAD, nontoxic, nonseptic appearing.   She's not a second syncopal episode. We'll discharge home with [facility].  I have also discussed reasons to return immediately to the ER.  Patient expresses understanding and agrees with plan.   1. Medications: Prenatal Vitamins, usual home medications 2. Treatment: rest, drink plenty of fluids,  3. Follow Up: Please followup with your primary doctor for discussion of your diagnoses and further evaluation after today's visit; if you do not have a primary care doctor use the resource guide provided to find one;  [facility]          [facility] PA-C 10/10/12 2143

## 2012-10-10 NOTE — ED Provider Notes (Signed)
ECG shows normal sinus rhythm with a rate of 76, no ectopy. Normal axis. Normal P wave. Normal QRS. Normal intervals. Normal ST and T waves. Impression: normal ECG. No prior ECG available for comparison.  Medical screening examination/treatment/procedure(s) were performed by non-physician practitioner and as supervising physician I was immediately available for consultation/collaboration.   Dione Booze, MD 10/10/12 1728

## 2012-10-12 ENCOUNTER — Ambulatory Visit (INDEPENDENT_AMBULATORY_CARE_PROVIDER_SITE_OTHER): Payer: Medicaid Other | Admitting: Family

## 2012-10-12 ENCOUNTER — Encounter: Payer: Self-pay | Admitting: Family

## 2012-10-12 ENCOUNTER — Other Ambulatory Visit (HOSPITAL_COMMUNITY)
Admission: RE | Admit: 2012-10-12 | Discharge: 2012-10-12 | Disposition: A | Discharge status: Home / Self Care | Payer: Medicaid Other | Source: Ambulatory Visit | Attending: Family | Admitting: Family

## 2012-10-12 VITALS — BP 127/84 | Temp 97.8°F | Wt 139.1 lb

## 2012-10-12 DIAGNOSIS — O0990 Supervision of high risk pregnancy, unspecified, unspecified trimester: Secondary | ICD-10-CM | POA: Insufficient documentation

## 2012-10-12 DIAGNOSIS — O09299 Supervision of pregnancy with other poor reproductive or obstetric history, unspecified trimester: Secondary | ICD-10-CM | POA: Insufficient documentation

## 2012-10-12 DIAGNOSIS — Z01419 Encounter for gynecological examination (general) (routine) without abnormal findings: Secondary | ICD-10-CM | POA: Insufficient documentation

## 2012-10-12 DIAGNOSIS — Z1151 Encounter for screening for human papillomavirus (HPV): Secondary | ICD-10-CM | POA: Insufficient documentation

## 2012-10-12 LAB — COMPREHENSIVE METABOLIC PANEL
AST: 14 U/L (ref 0–37)
Alkaline Phosphatase: 35 U/L — ABNORMAL LOW (ref 39–117)
Glucose, Bld: 70 mg/dL (ref 70–99)
Sodium: 138 mEq/L (ref 135–145)
Total Bilirubin: 0.3 mg/dL (ref 0.3–1.2)
Total Protein: 6.3 g/dL (ref 6.0–8.3)

## 2012-10-12 LAB — POCT URINALYSIS DIP (DEVICE)
Ketones, ur: NEGATIVE mg/dL
Protein, ur: NEGATIVE mg/dL
Specific Gravity, Urine: 1.015 (ref 1.005–1.030)
pH: 6 (ref 5.0–8.0)

## 2012-10-12 LAB — HIV ANTIBODY (ROUTINE TESTING W REFLEX): HIV: NONREACTIVE

## 2012-10-12 NOTE — Progress Notes (Signed)
P=89, Used Interpreter Redgie Grayer, c/o pelvic pain lower right and left quadrants, States no vaginal bleeding now, but had some dark red vaginal bleeding once around December 18-19.Discussed BMI and appropriate weight gain. Given new patient information.

## 2012-10-12 NOTE — Progress Notes (Signed)
New OB:  Pt prior deliveries in [place], 1st delivery approx 6 months (?); reports did not see baby after delivery; induced for high blood pressure; reports blood pressure normal between pregnancies; Dated by certain LMP confirmed by 6 wk [place] in [facility]U for abdominal pain.  Desires Quad screen; Will obtain PIH baseline labs.  OB panel drawn, pap smear collected.  Exam    Uterine Size: size equals dates  Pelvic Exam:    Perineum: No Hemorrhoids, Normal Perineum   Vulva: normal   Vagina:  normal mucosa, normal discharge, no palpable nodules   pH: Not done   Cervix: no bleeding following Pap, no cervical motion tenderness and no lesions   Adnexa: normal adnexa and no mass, fullness, tenderness   Bony Pelvis: Adequate  System: Breast:  No nipple retraction or dimpling, No nipple discharge or bleeding, No axillary or supraclavicular adenopathy, Normal to palpation without dominant masses   Skin: normal coloration and turgor, no rashes    Neurologic: negative   Extremities: normal strength, tone, and muscle mass   HEENT neck supple with midline trachea and thyroid without masses   Mouth/Teeth mucous membranes moist, pharynx normal without lesions   Neck supple and no masses   Cardiovascular: regular rate and rhythm, no murmurs or gallops   Respiratory:  appears well, vitals normal, no respiratory distress, acyanotic, normal RR, neck free of mass or lymphadenopathy, chest clear, no wheezing, crepitations, rhonchi, normal symmetric air entry   Abdomen: soft, non-tender; bowel sounds normal; no masses,  no organomegaly   Urinary: urethral meatus normal

## 2012-10-12 NOTE — Addendum Note (Signed)
Addended by: Franchot Mimes on: 10/12/2012 10:15 AM   Modules accepted: Orders

## 2012-10-13 LAB — OBSTETRIC PANEL
Antibody Screen: NEGATIVE
Basophils Absolute: 0 10*3/uL (ref 0.0–0.1)
Eosinophils Relative: 8 % — ABNORMAL HIGH (ref 0–5)
HCT: 35.7 % — ABNORMAL LOW (ref 36.0–46.0)
Hemoglobin: 12.5 g/dL (ref 12.0–15.0)
Lymphocytes Relative: 29 % (ref 12–46)
MCHC: 35 g/dL (ref 30.0–36.0)
MCV: 84.8 fL (ref 78.0–100.0)
Monocytes Absolute: 0.5 10*3/uL (ref 0.1–1.0)
Monocytes Relative: 8 % (ref 3–12)
RDW: 15.5 % (ref 11.5–15.5)
Rh Type: POSITIVE
Rubella: 17.8 Index — ABNORMAL HIGH (ref <0.90)
WBC: 6.7 10*3/uL (ref 4.0–10.5)

## 2012-10-13 LAB — DIFFERENTIAL

## 2012-10-13 LAB — CREATININE, URINE, RANDOM: Creatinine, Urine: 61.7 mg/dL

## 2012-10-16 LAB — COMPREHENSIVE METABOLIC PANEL WITH GFR
ALT: 9 U/L (ref 0–35)
AST: 13 U/L (ref 0–37)
Albumin: 4.1 g/dL (ref 3.5–5.2)
Alkaline Phosphatase: 36 U/L — ABNORMAL LOW (ref 39–117)
BUN: 8 mg/dL (ref 6–23)
CO2: 22 meq/L (ref 19–32)
Calcium: 9.2 mg/dL (ref 8.4–10.5)
Chloride: 106 meq/L (ref 96–112)
Creat: 0.6 mg/dL (ref 0.50–1.10)
Glucose, Bld: 74 mg/dL (ref 70–99)
Potassium: 3.9 meq/L (ref 3.5–5.3)
Sodium: 136 meq/L (ref 135–145)
Total Bilirubin: 0.3 mg/dL (ref 0.3–1.2)
Total Protein: 6.8 g/dL (ref 6.0–8.3)

## 2012-10-16 LAB — CULTURE, OB URINE

## 2012-10-16 LAB — HEMOGLOBINOPATHY EVALUATION
Hemoglobin Other: 0 %
Hgb F Quant: 0.4 % (ref 0.0–2.0)
Hgb S Quant: 39.6 % — ABNORMAL HIGH

## 2012-10-16 NOTE — Addendum Note (Signed)
Addended by: Franchot Mimes on: 10/16/2012 10:52 AM   Modules accepted: Orders

## 2012-10-17 LAB — PROTEIN, URINE, 24 HOUR: Protein, 24H Urine: 140 mg/d — ABNORMAL HIGH (ref 50–100)

## 2012-10-17 LAB — CREATININE CLEARANCE, URINE, 24 HOUR
Creatinine Clearance: 180 mL/min — ABNORMAL HIGH (ref 75–115)
Creatinine, 24H Ur: 1557 mg/d (ref 700–1800)

## 2012-10-17 NOTE — ED Provider Notes (Signed)
Medical screening examination/treatment/procedure(s) were performed by resident physician or non-physician practitioner and as supervising physician I was immediately available for consultation/collaboration.   KINDL,JAMES DOUGLAS MD.    James D Kindl, MD 10/17/12 1613 

## 2012-10-20 ENCOUNTER — Other Ambulatory Visit: Payer: Self-pay | Admitting: Family

## 2012-10-20 MED ORDER — CEPHALEXIN 500 MG PO CAPS: 500.0000 mg | ORAL_CAPSULE | Freq: Three times a day (TID) | ORAL | Status: DC

## 2012-10-20 NOTE — Progress Notes (Signed)
Note sent to clinic to notify pt regarding positive urine culture and medication at pharmacy.

## 2012-10-26 ENCOUNTER — Ambulatory Visit (INDEPENDENT_AMBULATORY_CARE_PROVIDER_SITE_OTHER): Payer: Medicaid Other | Admitting: Family

## 2012-10-26 VITALS — BP 122/75 | Temp 97.1°F | Wt 139.4 lb

## 2012-10-26 DIAGNOSIS — O0990 Supervision of high risk pregnancy, unspecified, unspecified trimester: Secondary | ICD-10-CM

## 2012-10-26 DIAGNOSIS — O09299 Supervision of pregnancy with other poor reproductive or obstetric history, unspecified trimester: Secondary | ICD-10-CM

## 2012-10-26 LAB — POCT URINALYSIS DIP (DEVICE)
Ketones, ur: NEGATIVE mg/dL
Protein, ur: NEGATIVE mg/dL
Specific Gravity, Urine: 1.015 (ref 1.005–1.030)
pH: 5.5 (ref 5.0–8.0)

## 2012-10-26 NOTE — Progress Notes (Signed)
U/S scheduled 11/16/12 at 930 am, [facility] # [phone] used(Swahili)

## 2012-10-26 NOTE — Progress Notes (Signed)
No questions or concerns; doing well; schedule anatomy US in 3 weeks. Reviewed lab results.

## 2012-10-31 LAB — CYTOLOGY - PAP: Pap: NEGATIVE

## 2012-11-01 LAB — SICKLE CELL SCREEN

## 2012-11-01 LAB — OB RESULTS CONSOLE GC/CHLAMYDIA: Gonorrhea: NEGATIVE

## 2012-11-06 DIAGNOSIS — O0990 Supervision of high risk pregnancy, unspecified, unspecified trimester: Secondary | ICD-10-CM

## 2012-11-06 DIAGNOSIS — O09299 Supervision of pregnancy with other poor reproductive or obstetric history, unspecified trimester: Secondary | ICD-10-CM

## 2012-11-09 ENCOUNTER — Encounter: Payer: Medicaid Other | Admitting: Family

## 2012-11-14 ENCOUNTER — Encounter: Payer: Self-pay | Admitting: *Deleted

## 2012-11-16 ENCOUNTER — Ambulatory Visit (HOSPITAL_COMMUNITY)
Admission: RE | Admit: 2012-11-16 | Discharge: 2012-11-16 | Disposition: A | Discharge status: Home / Self Care | Payer: Medicaid Other | Source: Ambulatory Visit | Attending: Family | Admitting: Family

## 2012-11-16 ENCOUNTER — Ambulatory Visit (INDEPENDENT_AMBULATORY_CARE_PROVIDER_SITE_OTHER): Payer: Medicaid Other | Admitting: Family

## 2012-11-16 ENCOUNTER — Other Ambulatory Visit: Payer: Self-pay | Admitting: Family

## 2012-11-16 VITALS — BP 135/83 | Temp 97.1°F | Wt 144.3 lb

## 2012-11-16 DIAGNOSIS — O09299 Supervision of pregnancy with other poor reproductive or obstetric history, unspecified trimester: Secondary | ICD-10-CM | POA: Insufficient documentation

## 2012-11-16 DIAGNOSIS — Z363 Encounter for antenatal screening for malformations: Secondary | ICD-10-CM | POA: Insufficient documentation

## 2012-11-16 DIAGNOSIS — Z1389 Encounter for screening for other disorder: Secondary | ICD-10-CM | POA: Insufficient documentation

## 2012-11-16 DIAGNOSIS — Z8751 Personal history of pre-term labor: Secondary | ICD-10-CM | POA: Insufficient documentation

## 2012-11-16 DIAGNOSIS — O358XX0 Maternal care for other (suspected) fetal abnormality and damage, not applicable or unspecified: Secondary | ICD-10-CM | POA: Insufficient documentation

## 2012-11-16 LAB — POCT URINALYSIS DIP (DEVICE)
Glucose, UA: NEGATIVE mg/dL
Hgb urine dipstick: NEGATIVE
Ketones, ur: NEGATIVE mg/dL
Specific Gravity, Urine: 1.01 (ref 1.005–1.030)

## 2012-11-16 NOTE — Progress Notes (Signed)
Pulse- 88 [place] interpreter # [id number]

## 2012-11-16 NOTE — Progress Notes (Signed)
No questions or concerns; reviewed ultrasound results with pt > reschedule in 2wks for visualization of spine.

## 2012-11-16 NOTE — Progress Notes (Signed)
U/S scheduled 11/23/12 at 9 15 am.

## 2012-11-23 ENCOUNTER — Other Ambulatory Visit: Payer: Self-pay | Admitting: Family

## 2012-11-23 ENCOUNTER — Ambulatory Visit (HOSPITAL_COMMUNITY)
Admission: RE | Admit: 2012-11-23 | Discharge: 2012-11-23 | Disposition: A | Discharge status: Home / Self Care | Payer: Medicaid Other | Source: Ambulatory Visit | Attending: Family | Admitting: Family

## 2012-11-23 DIAGNOSIS — Z3689 Encounter for other specified antenatal screening: Secondary | ICD-10-CM | POA: Insufficient documentation

## 2012-11-23 DIAGNOSIS — O09299 Supervision of pregnancy with other poor reproductive or obstetric history, unspecified trimester: Secondary | ICD-10-CM | POA: Insufficient documentation

## 2012-11-24 ENCOUNTER — Encounter: Payer: Self-pay | Admitting: Family

## 2012-11-30 ENCOUNTER — Other Ambulatory Visit: Payer: Self-pay | Admitting: Advanced Practice Midwife

## 2012-11-30 ENCOUNTER — Ambulatory Visit (INDEPENDENT_AMBULATORY_CARE_PROVIDER_SITE_OTHER): Payer: Medicaid Other | Admitting: Advanced Practice Midwife

## 2012-11-30 VITALS — BP 128/81 | Temp 97.9°F | Wt 146.1 lb

## 2012-11-30 DIAGNOSIS — D573 Sickle-cell trait: Secondary | ICD-10-CM

## 2012-11-30 DIAGNOSIS — O09299 Supervision of pregnancy with other poor reproductive or obstetric history, unspecified trimester: Secondary | ICD-10-CM

## 2012-11-30 LAB — POCT URINALYSIS DIP (DEVICE)
Bilirubin Urine: NEGATIVE
Glucose, UA: NEGATIVE mg/dL
Ketones, ur: NEGATIVE mg/dL
Leukocytes, UA: NEGATIVE
Nitrite: NEGATIVE

## 2012-11-30 NOTE — Progress Notes (Signed)
Pulse- 90  Pain-"pain in [place] when walk a lot" Interpreter # [phone]

## 2012-11-30 NOTE — Progress Notes (Signed)
C/o bilat low abd pain w/ walking. Cervix long and closed. Dx RLP. PIH precautions.

## 2012-11-30 NOTE — Patient Instructions (Signed)
Pregnancy - Second Trimester The second trimester is the period between 13 to 27 weeks of your pregnancy. It is important to follow your doctor's instructions. HOME CARE   Do not smoke.  Do not drink alcohol or use drugs.  Only take medicine as told by your doctor.  Take prenatal vitamins as told. The vitamin should contain 1 milligram of folic acid.  Exercise.  Eat healthy foods. Eat regular, well-balanced meals.  You can have sex (intercourse) if there are no other problems with the pregnancy.  Do not use hot tubs, steam rooms, or saunas.  Wear a seat belt while driving.  Avoid raw meat, uncooked cheese, and litter boxes and soil used by cats.  Visit your dentist. Shirlee [place] are okay. GET HELP RIGHT AWAY IF:   You have a temperature by mouth above 102 F (38.9 C), not controlled by medicine.  Fluid is coming from your vagina.  Blood is coming from your vagina. Light spotting is common, especially after sex (intercourse).  You have a bad smelling fluid (discharge) coming from the vagina. The fluid changes from clear to white.  You still feel sick to your stomach (nauseous).  You throw up (vomit) blood.  You lose or gain more than 2 pounds (0.9 kilograms) of weight in a week, or as suggested by your doctor.  Your face, hands, feet, or legs get puffy (swell).  You get exposed to [place] measles and have never had them.  You get exposed to fifth disease or chickenpox.  You have belly (abdominal) pain.  You have a bad headache that will not go away.  You have watery poop (diarrhea), pain when you pee (urinate), or have shortness of breath.  You start to have problems seeing (blurry or double vision).  You fall, are in a car accident, or have any kind of trauma.  There is mental or physical violence at home.  You have any concerns or worries during your pregnancy. MAKE SURE YOU:   Understand these instructions.  Will watch your condition.  Will get help  right away if you are not doing well or get worse. Document Released: 12/08/2009 Document Revised: 12/06/2011 Document Reviewed: 12/08/2009 [facility] Patient Information 2013 [place], [place].   Hypertension During Pregnancy Hypertension is also called high blood pressure. Blood pressure moves blood in your body. Sometimes, the force that moves the blood becomes too strong. When you are pregnant, this condition should be watched carefully. It can cause problems for you and your baby. HOME CARE   Make and keep all of your doctor visits.  Take medicine as told by your doctor. Tell your doctor about all medicines you take.  Eat very little salt.  Exercise regularly.  Do not drink alcohol.  Do not smoke.  Do not have drinks with caffeine.  Lie on your left side when resting. GET HELP RIGHT AWAY IF:  You have bad belly (abdominal) pain.  You have sudden puffiness (swelling) in the hands, ankles, or face.  You gain 4 pounds (1.8 kilograms) or more in 1 week.  You throw up (vomit) repeatedly.  You have bleeding from the vagina.  You do not feel the baby moving as much.  You have a headache.  You have blurred or double vision.  You have muscle twitching or spasms.  You have shortness of breath.  You have blue fingernails and lips.  You have blood in your pee (urine). MAKE SURE YOU:  Understand these instructions.  Will watch your condition.  Will get help right away if you are not doing well. Document Released: 10/16/2010 Document Revised: 12/06/2011 Document Reviewed: 04/30/2011 [facility] Patient Information 2013 [place], [place].

## 2012-12-06 ENCOUNTER — Encounter: Payer: Self-pay | Admitting: *Deleted

## 2012-12-28 ENCOUNTER — Ambulatory Visit (INDEPENDENT_AMBULATORY_CARE_PROVIDER_SITE_OTHER): Payer: Medicaid Other | Admitting: Advanced Practice Midwife

## 2012-12-28 ENCOUNTER — Other Ambulatory Visit: Payer: Self-pay | Admitting: Family Medicine

## 2012-12-28 VITALS — BP 123/82 | Temp 97.7°F | Wt 150.7 lb

## 2012-12-28 DIAGNOSIS — O09299 Supervision of pregnancy with other poor reproductive or obstetric history, unspecified trimester: Secondary | ICD-10-CM

## 2012-12-28 DIAGNOSIS — L03019 Cellulitis of unspecified finger: Secondary | ICD-10-CM

## 2012-12-28 DIAGNOSIS — R55 Syncope and collapse: Secondary | ICD-10-CM

## 2012-12-28 LAB — POCT URINALYSIS DIP (DEVICE)
Bilirubin Urine: NEGATIVE
Glucose, UA: NEGATIVE mg/dL
Ketones, ur: NEGATIVE mg/dL
Leukocytes, UA: NEGATIVE
Nitrite: NEGATIVE

## 2012-12-28 MED ORDER — PRENATAL MULTIVITAMIN CH: 1.0000 | ORAL_TABLET | Freq: Every day | ORAL | Status: DC

## 2012-12-28 NOTE — Progress Notes (Signed)
Doing well.  Good fetal movement, denies vaginal bleeding, LOF, regular contractions.  Cramping when working too much but reports it improves when resting and off her feet.

## 2012-12-28 NOTE — Progress Notes (Signed)
Patient reports lower abdominal pain when walking.

## 2013-01-18 ENCOUNTER — Ambulatory Visit (INDEPENDENT_AMBULATORY_CARE_PROVIDER_SITE_OTHER): Payer: Medicaid Other | Admitting: Family Medicine

## 2013-01-18 VITALS — BP 123/80 | Temp 97.6°F | Wt 151.9 lb

## 2013-01-18 DIAGNOSIS — O09299 Supervision of pregnancy with other poor reproductive or obstetric history, unspecified trimester: Secondary | ICD-10-CM

## 2013-01-18 DIAGNOSIS — O09293 Supervision of pregnancy with other poor reproductive or obstetric history, third trimester: Secondary | ICD-10-CM

## 2013-01-18 DIAGNOSIS — O0992 Supervision of high risk pregnancy, unspecified, second trimester: Secondary | ICD-10-CM

## 2013-01-18 LAB — POCT URINALYSIS DIP (DEVICE)
Bilirubin Urine: NEGATIVE
Glucose, UA: NEGATIVE mg/dL
Ketones, ur: NEGATIVE mg/dL
Leukocytes, UA: NEGATIVE
Protein, ur: NEGATIVE mg/dL
Specific Gravity, Urine: 1.02 (ref 1.005–1.030)

## 2013-01-18 LAB — CBC
MCH: 30.8 pg (ref 26.0–34.0)
Platelets: 271 10*3/uL (ref 150–400)
RBC: 4.28 MIL/uL (ref 3.87–5.11)

## 2013-01-18 MED ORDER — WRIST SPLINT MISC: 2.0000 | Freq: Every day | Status: DC

## 2013-01-18 MED ORDER — FAMOTIDINE 20 MG PO TABS: 20.0000 mg | ORAL_TABLET | Freq: Two times a day (BID) | ORAL | Status: DC

## 2013-01-18 NOTE — Patient Instructions (Signed)
Carpal Tunnel Syndrome Fact Sheet You are working at your desk, trying to ignore the tingling or numbness you have had for months in your hand and wrist. Suddenly, a sharp, piercing pain shoots through the wrist and up your arm. Just a passing cramp? More likely you have carpal tunnel syndrome. This is a painful progressive condition caused by compression of a key nerve in the wrist. WHAT IS CARPAL TUNNEL SYNDROME? Carpal tunnel syndrome occurs when the nerve that runs from the forearm into the hand (median nerve), becomes pressed or squeezed at the wrist. The median nerve controls sensations to the palm side of the thumb and fingers (although not the little finger). That nerve also controls impulses to some small muscles in the hand, that allow the fingers and thumb to move. The narrow, rigid passageway of ligament and bones at the base of the hand (carpal tunnel) houses the median nerve and tendons. Sometimes, thickening from irritated tendons or other swelling narrows the tunnel. The narrowing causes the median nerve to be compressed. The result may be pain, weakness, or numbness in the hand and wrist that moves (radiates) up the arm. Although painful sensations may indicate other conditions, carpal tunnel syndrome is the most common and widely known of the entrapment neuropathies. These are conditions in which the body's peripheral nerves are compressed or traumatized. SYMPTOMS   Symptoms usually start gradually, with:   Frequent burning.   Tingling.   Itching numbness in the palm of the hand and the fingers.  This is especially true in the thumb and the index and middle fingers.  Some carpal tunnel sufferers say their fingers feel useless and swollen. They may feel this way even though little or no swelling is apparent.   The symptoms often first appear in one or both hands during the night, since many people sleep with flexed wrists. A person with carpal tunnel syndrome may wake up feeling the  need to shake out the hand or wrist.   As symptoms worsen, people might feel tingling during the day.   Decreased grip strength may make it difficult to:   Form a fist.   Grasp small objects.   Perform other manual tasks.  In chronic and/or untreated cases, the muscles at the base of the thumb may waste away.  Some people are unable to tell between hot and cold by touch.  CAUSES   Carpal tunnel syndrome is often the result of a combination of factors that increase pressure on the median nerve and tendons in the carpal tunnel. It is usually not a problem with the nerve itself.   Most likely the disorder is due to a congenital predisposition. This means that the carpal tunnel is simply smaller in some people than in others.   Other factors include:   Trauma or injury to the wrist that cause swelling, such as sprain or fracture.   Overactivity of the pituitary gland.   Hypothyroidism.   Rheumatoid arthritis.   Mechanical problems in the wrist joint.   Work stress.   Repeated use of vibrating hand tools.   Fluid retention during pregnancy or menopause.   The development of a cyst or tumor in the canal.   In some cases, no cause can be identified.   There is little clinical data to prove whether repetitive and forceful movements of the hand and wrist during work or leisure activities can cause carpal tunnel syndrome. Repeated motions performed in the course of normal work or other daily  activities can result in repetitive motion disorders such as bursitis and tendonitis. Writer's cramp, a condition in which a lack of fine motor skill coordination and ache and pressure in the fingers, wrist, or forearm is brought on by repetitive activity, is not a symptom of carpal tunnel syndrome.  WHO IS AT RISK OF DEVELOPING CARPAL TUNNEL SYNDROME?  Women are three times more likely than men to develop carpal tunnel syndrome. This may be the result of the carpal tunnel being smaller in  women.   The dominant hand is usually affected first and produces the most severe pain.   Diabetes or other metabolic disorders that directly affect the body's nerves make people more susceptible to compression.   Carpal tunnel syndrome usually occurs only in adults.   The risk of developing carpal tunnel syndrome is not confined to people in a single industry or job. However, it is especially common in those performing assembly line work, such as:   Set designer.   Sewing.   Finishing.   Cleaning.   Meat packing.  Carpal tunnel syndrome is three times more common among assemblers than among data-entry personnel. A 2001 study by the [facility] found heavy computer use (up to 7 hours a day) did not increase a person's risk of developing carpal tunnel syndrome.  During 1998, an estimated three of every 10,000 workers lost time from work because of carpal tunnel syndrome. Half of these workers missed more than 10 days of work. The average lifetime cost of carpal tunnel syndrome is estimated to be about $30,000 for each injured worker. This includes medical bills and lost time from work  DIAGNOSIS   Early diagnosis and treatment are important to avoid permanent damage to the median nerve.   A physical examination of the hands, arms, shoulders, and neck can help determine if the patient's complaints are related to daily activities or to an underlying disorder. An exam can rule out other painful conditions that mimic carpal tunnel syndrome.   The wrist is examined for:   Tenderness.   Swelling.   Warmth.   Discoloration.   Each finger should be tested for sensation. The muscles at the base of the hand should be examined for strength and signs of shrinking (atrophy).   Routine laboratory tests and X-rays can reveal:   Diabetes.   Arthritis.   Fractures.   Physicians can use specific tests to try to produce the symptoms of carpal tunnel syndrome.   In the Tinel test, the  caregiver taps or presses on the median nerve in the patient's wrist. The test is positive when tingling in the fingers or a shock-like sensation occurs.   The Phalen or wrist-flexion test involves having the patient hold their forearms upright by pointing the fingers down and pressing the backs of the hands together. The presence of carpal tunnel syndrome is suggested if one or more symptoms is felt in the fingers within 1 minute.   Caregivers may also ask patients to try to make a movement that brings on symptoms.   Often it is necessary to confirm the diagnosis by use of electrodiagnostic tests.   In a nerve conduction study, electrodes are placed on the hand and wrist. Small electric shocks are applied and the speed with which nerves transmit impulses is measured.   In electromyography, a fine needle is inserted into a muscle. The electrical activity is viewed on a screen. The activity can determine the severity of damage to the median nerve.  Ultrasound imaging can show impaired movement of the median nerve.  TREATMENT  Treatments for carpal tunnel syndrome should begin as early as possible. It should be done under a caregiver's direction. Underlying causes such as diabetes or arthritis should be treated first. Initial treatment generally involves:  Resting the affected hand and wrist for at least 2 weeks.   Avoiding activities that may worsen symptoms.   Immobilizing (keeping from moving) the wrist in a splint.  These things are all done to avoid further damage from twisting or bending. If there is inflammation, applying cool packs can help reduce swelling. NON-SURGICAL  Drugs - In special circumstances, different drugs can ease the pain and swelling.   Nonsteroidal anti-inflammatory drugs, such as aspirin, and other non-prescription pain relievers, may ease symptoms.   Orally administered diuretics (water pills) can decrease swelling.   Corticosteroids such as prednisone or  lidocaine can be injected directly into the wrist or taken by mouth. This can relieve pressure on the median nerve. It may provide immediate, but temporary relief to persons with mild or intermittent symptoms. (Caution: persons with diabetes and those who may be predisposed to diabetes should note that long term use of corticosteroids can make it difficult to regulate insulin levels. Corticosterioids should not be taken without a prescription.)   Additionally, some studies show that vitamin B6 (pyridoxine) supplements may ease the symptoms of carpal tunnel syndrome.   Exercise - Stretching and strengthening exercises can be helpful in people whose symptoms have decreased. These exercises may be supervised by a physical therapist that is trained to use exercises to treat physical impairments. The exercises can also be supervised by an occupational therapist. This is someone who is trained in evaluating people with physical impairments. They will help them build skills to improve their health and well-being.   Alternative therapies - Acupuncture and chiropractic care have helped some patients. Their effectiveness remains unproved. An exception is yoga, which has been shown to reduce pain and improve grip strength among patients with carpal tunnel syndrome.  SURGERY Carpal tunnel release is a surgical procedure commonly used. It is generally recommended if symptoms last for 6 months or more. This surgery involves cutting the band of tissue around the wrist to reduce pressure on the median nerve. Surgery is done under local anesthesia. It does not require an overnight hospital stay. Many patients require surgery on both hands. The following are types of carpal tunnel release surgery:  Open release surgery. This is the traditional procedure used to correct carpal tunnel syndrome. It consists of making a cut (incision) up to 2 inches in the wrist. Then, the surgeon cuts the carpal ligament to enlarge the carpal  tunnel. The procedure is generally done under local anesthesia on an outpatient basis. The only exception is if there are unusual medical considerations.   Endoscopic surgery may allow faster recovery and less postoperative discomfort than traditional open release surgery. The surgeon makes two incisions (about 1/2" each) in the wrist and palm. The surgeon will:   Insert a camera attached to a tube.   Observe the tissue on a screen.   Cut the carpal ligament (the tissue that holds joints together).  This two-portal endoscopic surgery, generally performed under local anesthesia, is effective and minimizes scarring and scar tenderness. One-portal endoscopic surgery for carpal tunnel syndrome is also available. Although symptoms may be relieved immediately after surgery, full recovery from carpal tunnel surgery can take months. Some patients may have:  Infection.   Nerve  damage.   Stiffness.   Pain at the scar.  Occasionally, the wrist loses strength because the carpal ligament is cut. Patients should undergo physical therapy after surgery to restore wrist strength. Some patients may need to adjust job duties or even change jobs after recovery from surgery. Recurrence of carpal tunnel syndrome following treatment is rare. The majority of patients recover completely. PREVENTION  At the workplace, workers can:   Do on-the-job conditioning, perform stretching exercises.   Take frequent rest breaks.   Wear splints to keep wrists straight.   Use correct posture and wrist position.   Wearing finger-less gloves can help keep hands warm and flexible.   Design workstations, tools and tool handles, and tasks to enable the worker's wrist to maintain a natural position.   Rotate jobs among workers.   Employers can develop programs in ergonomics. This is the process of adapting workplace conditions and job demands to the capabilities of workers. However, research has not conclusively shown that  these workplace changes prevent the occurrence of carpal tunnel syndrome.  WHAT RESEARCH IS BEING DONE? The [employer] of Neu[employer] Disorders and Stroke (NINDS), a part of the [employer], is the federal government's leading supporter of research on carpal tunnel syndrome. Scientists are studying the order of events that occur with carpal tunnel syndrome. They do this to better understand, treat, and prevent this ailment.  WHAT IS PERCUTANEOUS BALLOON CARPAL TUNNEL-PLASTY? Percutaneous balloon carpal tunnel-plasty is an experimental technique that can ease carpal tunnel pain without cutting the carpal ligament. In this procedure, a 1/4 -inch cut is made at the base of the palm. The caregiver then inserts a balloon through a catheter under the carpal ligament and inflates the balloon to stretch the ligament and free the nerve. Patients in one small study of this procedure reported relief of symptoms with no complications after the surgery. Most of them were back to work within 2 weeks. This experimental technique is not yet widely available. FOR MORE INFORMATION American Academy of Orthopaedic Surgeons: www.aaos.org  Centers for Disease Control and Prevention (CDCP): FootballExhibition.com.br [employer] of Arthritis and Musculoskeletal and Skin Diseases (NIAMS): www.niams.http://www.myers.net/ American Chronic Pain Association (ACPA): www.theacpa.org National Chronic Pain Outreach Association (NCPOA): www.chronicpain.org Occupational Safety & Health Administration: ArmyDictionary.fi Document Released: 04/27/2004 Document Revised: 05/26/2011 Document Reviewed: 05/01/2008 [facility] Patient Information 2012 [place], [place].  Heartburn During Pregnancy  Heartburn is a burning sensation in the chest caused by stomach acid backing up into the esophagus. Heartburn (also known as "reflux") is common in pregnancy because a certain hormone (progesterone) changes. The progesterone hormone may relax the valve  that separates the esophagus from the stomach. This allows acid to go up into the esophagus, causing heartburn. Heartburn may also happen in pregnancy because the enlarging uterus pushes up on the stomach, which pushes more acid into the esophagus. This is especially true in the later stages of pregnancy. Heartburn problems usually go away after giving birth. CAUSES   The progesterone hormone.  Changing hormone levels.  The growing uterus that pushes stomach acid upward.  Large meals.  Certain foods and drinks.  Exercise.  Increased acid production. SYMPTOMS   Burning pain in the chest or lower throat.  Bitter taste in the mouth.  Coughing. DIAGNOSIS  Heartburn is typically diagnosed by your caregiver when taking a careful history of your concern. Your caregiver may order a blood test to check for a certain type of bacteria that is associated with heartburn. Sometimes, heartburn is  diagnosed by prescribing a heartburn medicine to see if the symptoms improve. It is rare in pregnancy to have a procedure called an endoscopy. This is when a tube with a light and a camera on the end is used to examine the esophagus and the stomach. TREATMENT   Your caregiver may tell you to use certain over-the-counter medicines (antacids, acid reducers) for mild heartburn.  Your caregiver may prescribe medicines to decrease stomach acid or to protect your stomach lining.  Your caregiver may recommend certain diet changes.  For severe cases, your caregiver may recommend that the head of the bed be elevated on blocks. (Sleeping with more pillows is not an effective treatment as it only changes the position of your head and does not improve the main problem of stomach acid refluxing into the esophagus.) HOME CARE INSTRUCTIONS   Take all medicines as directed by your caregiver.  Raise the head of your bed by putting blocks under the legs if instructed to by your caregiver.  Do not exercise right after  eating.  Avoid eating 2 or 3 hours before bed. Do not lie down right after eating.  Eat small meals throughout the day instead of 3 large meals.  Identify foods and beverages that make your symptoms worse and avoid them. Foods you may want to avoid include:  Peppers.  Chocolate.  High-fat foods, including fried foods.  Spicy foods.  Garlic and onions.  Citrus fruits, including oranges, grapefruit, lemons, and limes.  Food containing tomatoes or tomato products.  Mint.  Carbonated and caffeinated drinks.  Vinegar. SEEK IMMEDIATE MEDICAL CARE IF:   You have severe chest pain that goes down your arm or into your jaw or neck.  You feel sweaty, dizzy, or lightheaded.  You become short of breath.  You vomit blood.  You have difficulty or pain with swallowing.  You have bloody or black, tarry stools.  You have episodes of heartburn more than 3 times a week, for more than 2 weeks. MAKE SURE YOU:  Understand these instructions.  Will watch your condition.  Will get help right away if you are not doing well or get worse. Document Released: 09/10/2000 Document Revised: 12/06/2011 Document Reviewed: 03/04/2011 [facility] Patient Information 2013 [place], [place].  Pregnancy - Third Trimester The third trimester of pregnancy (the last 3 months) is a period of the most rapid growth for you and your baby. The baby approaches a length of 20 inches and a weight of 6 to 10 pounds. The baby is adding on fat and getting ready for life outside your body. While inside, babies have periods of sleeping and waking, suck their thumbs, and hiccups. You can often feel small contractions of the uterus. This is false labor. It is also called Braxton-Hicks contractions. This is like a practice for labor. The usual problems in this stage of pregnancy include more difficulty breathing, swelling of the hands and feet from water retention, and having to urinate more often because of the uterus and  baby pressing on your bladder.  PRENATAL EXAMS  Blood work may continue to be done during prenatal exams. These tests are done to check on your health and the probable health of your baby. Blood work is used to follow your blood levels (hemoglobin). Anemia (low hemoglobin) is common during pregnancy. Iron and vitamins are given to help prevent this. You may also continue to be checked for diabetes. Some of the past blood tests may be done again.  The size of the  uterus is measured during each visit. This makes sure your baby is growing properly according to your pregnancy dates.  Your blood pressure is checked every prenatal visit. This is to make sure you are not getting toxemia.  Your urine is checked every prenatal visit for infection, diabetes and protein.  Your weight is checked at each visit. This is done to make sure gains are happening at the suggested rate and that you and your baby are growing normally.  Sometimes, an ultrasound is performed to confirm the position and the proper growth and development of the baby. This is a test done that bounces harmless sound waves off the baby so your caregiver can more accurately determine due dates.  Discuss the type of pain medication and anesthesia you will have during your labor and delivery.  Discuss the possibility and anesthesia if a Cesarean Section might be necessary.  Inform your caregiver if there is any mental or physical violence at home. Sometimes, a specialized non-stress test, contraction stress test and biophysical profile are done to make sure the baby is not having a problem. Checking the amniotic fluid surrounding the baby is called an amniocentesis. The amniotic fluid is removed by sticking a needle into the belly (abdomen). This is sometimes done near the end of pregnancy if an early delivery is required. In this case, it is done to help make sure the baby's lungs are mature enough for the baby to live outside of the womb. If  the lungs are not mature and it is unsafe to deliver the baby, an injection of cortisone medication is given to the mother 1 to 2 days before the delivery. This helps the baby's lungs mature and makes it safer to deliver the baby. CHANGES OCCURING IN THE THIRD TRIMESTER OF PREGNANCY Your body goes through many changes during pregnancy. They vary from person to person. Talk to your caregiver about changes you notice and are concerned about.  During the last trimester, you have probably had an increase in your appetite. It is normal to have cravings for certain foods. This varies from person to person and pregnancy to pregnancy.  You may begin to get stretch marks on your hips, abdomen, and breasts. These are normal changes in the body during pregnancy. There are no exercises or medications to take which prevent this change.  Constipation may be treated with a stool softener or adding bulk to your diet. Drinking lots of fluids, fiber in vegetables, fruits, and whole grains are helpful.  Exercising is also helpful. If you have been very active up until your pregnancy, most of these activities can be continued during your pregnancy. If you have been less active, it is helpful to start an exercise program such as walking. Consult your caregiver before starting exercise programs.  Avoid all smoking, alcohol, un-prescribed drugs, herbs and "street drugs" during your pregnancy. These chemicals affect the formation and growth of the baby. Avoid chemicals throughout the pregnancy to ensure the delivery of a healthy infant.  Backache, varicose veins and hemorrhoids may develop or get worse.  You will tire more easily in the third trimester, which is normal.  The baby's movements may be stronger and more often.  You may become short of breath easily.  Your belly button may stick out.  A yellow discharge may leak from your breasts called colostrum.  You may have a bloody mucus discharge. This usually  occurs a few days to a week before labor begins. HOME CARE INSTRUCTIONS  Keep your caregiver's appointments. Follow your caregiver's instructions regarding medication use, exercise, and diet.  During pregnancy, you are providing food for you and your baby. Continue to eat regular, well-balanced meals. Choose foods such as meat, fish, milk and other low fat dairy products, vegetables, fruits, and whole-grain breads and cereals. Your caregiver will tell you of the ideal weight gain.  A physical sexual relationship may be continued throughout pregnancy if there are no other problems such as early (premature) leaking of amniotic fluid from the membranes, vaginal bleeding, or belly (abdominal) pain.  Exercise regularly if there are no restrictions. Check with your caregiver if you are unsure of the safety of your exercises. Greater weight gain will occur in the last 2 trimesters of pregnancy. Exercising helps:  Control your weight.  Get you in shape for labor and delivery.  You lose weight after you deliver.  Rest a lot with legs elevated, or as needed for leg cramps or low back pain.  Wear a good support or jogging bra for breast tenderness during pregnancy. This may help if worn during sleep. Pads or tissues may be used in the bra if you are leaking colostrum.  Do not use hot tubs, steam rooms, or saunas.  Wear your seat belt when driving. This protects you and your baby if you are in an accident.  Avoid raw meat, cat litter boxes and soil used by cats. These carry germs that can cause birth defects in the baby.  It is easier to loose urine during pregnancy. Tightening up and strengthening the pelvic muscles will help with this problem. You can practice stopping your urination while you are going to the bathroom. These are the same muscles you need to strengthen. It is also the muscles you would use if you were trying to stop from passing gas. You can practice tightening these muscles up 10  times a set and repeating this about 3 times per day. Once you know what muscles to tighten up, do not perform these exercises during urination. It is more likely to cause an infection by backing up the urine.  Ask for help if you have financial, counseling or nutritional needs during pregnancy. Your caregiver will be able to offer counseling for these needs as well as refer you for other special needs.  Make a list of emergency phone numbers and have them available.  Plan on getting help from family or friends when you go home from the hospital.  Make a trial run to the hospital.  Take prenatal classes with the father to understand, practice and ask questions about the labor and delivery.  Prepare the baby's room/nursery.  Do not travel out of the city unless it is absolutely necessary and with the advice of your caregiver.  Wear only low or no heal shoes to have better balance and prevent falling. MEDICATIONS AND DRUG USE IN PREGNANCY  Take prenatal vitamins as directed. The vitamin should contain 1 milligram of folic acid. Keep all vitamins out of reach of children. Only a couple vitamins or tablets containing iron may be fatal to a baby or young child when ingested.  Avoid use of all medications, including herbs, over-the-counter medications, not prescribed or suggested by your caregiver. Only take over-the-counter or prescription medicines for pain, discomfort, or fever as directed by your caregiver. Do not use aspirin, ibuprofen (Motrin, Advil, Nuprin) or naproxen (Aleve) unless OK'd by your caregiver.  Let your caregiver also know about herbs you may be using.  Alcohol is related to a number of birth defects. This includes fetal alcohol syndrome. All alcohol, in any form, should be avoided completely. Smoking will cause low birth rate and premature babies.  Street/illegal drugs are very harmful to the baby. They are absolutely forbidden. A baby born to an addicted mother will be  addicted at birth. The baby will go through the same withdrawal an adult does. SEEK MEDICAL CARE IF: You have any concerns or worries during your pregnancy. It is better to call with your questions if you feel they cannot wait, rather than worry about them. DECISIONS ABOUT CIRCUMCISION You may or may not know the sex of your baby. If you know your baby is a boy, it may be time to think about circumcision. Circumcision is the removal of the foreskin of the penis. This is the skin that covers the sensitive end of the penis. There is no proven medical need for this. Often this decision is made on what is popular at the time or based upon religious beliefs and social issues. You can discuss these issues with your caregiver or pediatrician. SEEK IMMEDIATE MEDICAL CARE IF:   An unexplained oral temperature above 102 F (38.9 C) develops, or as your caregiver suggests.  You have leaking of fluid from the vagina (birth canal). If leaking membranes are suspected, take your temperature and tell your caregiver of this when you call.  There is vaginal spotting, bleeding or passing clots. Tell your caregiver of the amount and how many pads are used.  You develop a bad smelling vaginal discharge with a change in the color from clear to white.  You develop vomiting that lasts more than 24 hours.  You develop chills or fever.  You develop shortness of breath.  You develop burning on urination.  You loose more than 2 pounds of weight or gain more than 2 pounds of weight or as suggested by your caregiver.  You notice sudden swelling of your face, hands, and feet or legs.  You develop belly (abdominal) pain. Round ligament discomfort is a common non-cancerous (benign) cause of abdominal pain in pregnancy. Your caregiver still must evaluate you.  You develop a severe headache that does not go away.  You develop visual problems, blurred or double vision.  If you have not felt your baby move for more  than 1 hour. If you think the baby is not moving as much as usual, eat something with sugar in it and lie down on your left side for an hour. The baby should move at least 4 to 5 times per hour. Call right away if your baby moves less than that.  You fall, are in a car accident or any kind of trauma.  There is mental or physical violence at home. Document Released: 09/07/2001 Document Revised: 12/06/2011 Document Reviewed: 03/12/2009 [facility] Patient Information 2013 [place], [place].

## 2013-01-18 NOTE — Progress Notes (Signed)
Heartburn - start pepcid. Wrist pain, esp at night - wrist splints. 1 hour GTT today.

## 2013-01-18 NOTE — Progress Notes (Signed)
Pulse- 83 Pain- in hands, stomach

## 2013-01-19 LAB — RPR

## 2013-01-19 LAB — HIV ANTIBODY (ROUTINE TESTING W REFLEX): HIV: NONREACTIVE

## 2013-01-19 LAB — GLUCOSE TOLERANCE, 1 HOUR (50G) W/O FASTING: Glucose, 1 Hour GTT: 136 mg/dL (ref 70–140)

## 2013-01-20 ENCOUNTER — Encounter: Payer: Self-pay | Admitting: Family Medicine

## 2013-01-24 ENCOUNTER — Telehealth: Payer: Self-pay | Admitting: *Deleted

## 2013-01-24 ENCOUNTER — Encounter: Payer: Self-pay | Admitting: General Practice

## 2013-01-24 NOTE — Telephone Encounter (Signed)
Called patient at listed number with [employer]- wrong number. Will send patient letter

## 2013-01-24 NOTE — Telephone Encounter (Signed)
Message copied by Kathee Delton on Wed Jan 24, 2013  4:50 PM ------      Message from: FERRY, [place]      Created: Sat Jan 20, 2013  9:25 AM       Needs 3 hour GTT. Thanks. ------

## 2013-01-30 ENCOUNTER — Inpatient Hospital Stay (HOSPITAL_COMMUNITY)
Admission: AD | Admit: 2013-01-30 | Discharge: 2013-01-30 | Disposition: A | Discharge status: Home / Self Care | Payer: Medicaid Other | Source: Ambulatory Visit | Attending: Obstetrics & Gynecology | Admitting: Obstetrics & Gynecology

## 2013-01-30 DIAGNOSIS — O26899 Other specified pregnancy related conditions, unspecified trimester: Secondary | ICD-10-CM

## 2013-01-30 DIAGNOSIS — M545 Low back pain, unspecified: Secondary | ICD-10-CM | POA: Insufficient documentation

## 2013-01-30 DIAGNOSIS — O99891 Other specified diseases and conditions complicating pregnancy: Secondary | ICD-10-CM | POA: Insufficient documentation

## 2013-01-30 DIAGNOSIS — M549 Dorsalgia, unspecified: Secondary | ICD-10-CM

## 2013-01-30 LAB — URINALYSIS, ROUTINE W REFLEX MICROSCOPIC
Bilirubin Urine: NEGATIVE
Ketones, ur: NEGATIVE mg/dL
Leukocytes, UA: NEGATIVE
Nitrite: NEGATIVE
Specific Gravity, Urine: <1.005 — ABNORMAL LOW (ref 1.005–1.030)
Urobilinogen, UA: 0.2 mg/dL (ref 0.0–1.0)

## 2013-01-30 MED ORDER — TRAMADOL HCL 50 MG PO TABS: 100.0000 mg | ORAL_TABLET | Freq: Four times a day (QID) | ORAL | Status: DC | PRN

## 2013-01-30 MED ORDER — OXYCODONE-ACETAMINOPHEN 5-325 MG PO TABS
2.0000 | ORAL_TABLET | Freq: Once | ORAL | Status: AC
  Administered 2013-01-30: 2 via ORAL
  Filled 2013-01-30: qty 2

## 2013-01-30 NOTE — MAU Provider Note (Signed)
  History     CSN: [id number]  Arrival date and time: 01/30/13 1925   First Provider Initiated Contact with Patient 01/30/13 2046      Chief Complaint  Patient presents with  . Hip Pain  . Back Pain   HPI Carrie Duncan is a 27yo G3P1101 at 29.0wks who presents for eval of onset left upper buttock/lower back pain that started today. Denies injury, heavy lift, falling, or anything that may have triggered the pain. Denies leak or bldg. Reports +FM. No dysuria.  She is a pt of the [facility] for possible HTN, but is normotensive at this visit.  OB History   Grav Para Term Preterm Abortions TAB SAB Ect Mult Living   3 2 1 1  0 0    1      Past Medical History  Diagnosis Date  . Gestational HTN   . Pregnancy induced hypertension     Past Surgical History  Procedure Laterality Date  . No past surgeries      Family History  Problem Relation Age of Onset  . Hypertension Father     History  Substance Use Topics  . Smoking status: Never Smoker   . Smokeless tobacco: Never Used  . Alcohol Use: No    Allergies: No Known Allergies  Prescriptions prior to admission  Medication Sig Dispense Refill  . famotidine (PEPCID) 20 MG tablet Take 1 tablet (20 mg total) by mouth 2 (two) times daily.  60 tablet  6  . Prenatal Vit-Fe Fumarate-FA (PRENATAL MULTIVITAMIN) TABS Take 1 tablet by mouth daily.  30 tablet  5  . Elastic Bandages & Supports (WRIST SPLINT) MISC 2 each by Does not apply route at bedtime.  2 each  0    ROS Physical Exam   Blood pressure 137/92, pulse 101, temperature 97.8 F (36.6 C), temperature source Oral, resp. rate 20, height 5\' 1"  (1.549 m), weight 153 lb 8 oz (69.627 kg), last menstrual period 07/11/2012, SpO2 100.00%. Repeat BP 112/69  Physical Exam  Constitutional: She is oriented to Duncan, place, and time. She appears well-developed.  HENT:  Head: Normocephalic.  Neck: Normal range of motion.  Cardiovascular: Normal rate.   Respiratory: Effort normal.  GI:  Soft.  EFM 145, +accels, no decels No ctx on toco  Genitourinary: Vagina normal.  Cx C/L/high  Musculoskeletal:  Moves slowly due to discomfort, but has full range of motion  Neurological: She is alert and oriented to Duncan, place, and time.  Skin: Skin is warm and dry.  Psychiatric: She has a normal mood and affect. Her behavior is normal. Thought content normal.   Urinalysis    Component Value Date/Time   COLORURINE YELLOW 01/30/2013 1929   APPEARANCEUR CLEAR 01/30/2013 1929   LABSPEC <1.005* 01/30/2013 1929   PHURINE 6.0 01/30/2013 1929   GLUCOSEU NEGATIVE 01/30/2013 1929   HGBUR NEGATIVE 01/30/2013 1929   BILIRUBINUR NEGATIVE 01/30/2013 1929   KETONESUR NEGATIVE 01/30/2013 1929   PROTEINUR NEGATIVE 01/30/2013 1929   UROBILINOGEN 0.2 01/30/2013 1929   NITRITE NEGATIVE 01/30/2013 1929   LEUKOCYTESUR NEGATIVE 01/30/2013 1929      MAU Course  Procedures  MDM Given Percocet x 2 for pain- min relief after 40 mins  Assessment and Plan  IUP at 27.0wks Back pain- appears to be musculoskeletal  D/C home Comfort tips rev'd including heating pad Rx Tramadol F/U at next visit as scheduled or sooner prn    Cam Hai 01/30/2013, 8:54 PM

## 2013-01-30 NOTE — MAU Note (Signed)
Patient is in with c/o left hip to lower back sharp pain that started this evening. She denies vaginal bleeding or lof. She reports good fetal movement.

## 2013-02-01 ENCOUNTER — Ambulatory Visit (INDEPENDENT_AMBULATORY_CARE_PROVIDER_SITE_OTHER): Payer: Medicaid Other | Admitting: Family Medicine

## 2013-02-01 VITALS — BP 119/74 | Temp 97.0°F | Wt 154.6 lb

## 2013-02-01 DIAGNOSIS — O0992 Supervision of high risk pregnancy, unspecified, second trimester: Secondary | ICD-10-CM

## 2013-02-01 DIAGNOSIS — O09299 Supervision of pregnancy with other poor reproductive or obstetric history, unspecified trimester: Secondary | ICD-10-CM | POA: Insufficient documentation

## 2013-02-01 DIAGNOSIS — O09293 Supervision of pregnancy with other poor reproductive or obstetric history, third trimester: Secondary | ICD-10-CM

## 2013-02-01 LAB — POCT URINALYSIS DIP (DEVICE)
Bilirubin Urine: NEGATIVE
Leukocytes, UA: NEGATIVE
Nitrite: NEGATIVE
Protein, ur: NEGATIVE mg/dL
pH: 7 (ref 5.0–8.0)

## 2013-02-01 NOTE — Assessment & Plan Note (Signed)
BP ok today

## 2013-02-01 NOTE — Progress Notes (Signed)
P=83, Used [employer] [phone], c/o lower back pain at times, Discussed need for 3hr GTT with patient

## 2013-02-01 NOTE — Patient Instructions (Signed)
Pregnancy - Third Trimester The third trimester of pregnancy (the last 3 months) is a period of the most rapid growth for you and your baby. The baby approaches a length of 20 inches and a weight of 6 to 10 pounds. The baby is adding on fat and getting ready for life outside your body. While inside, babies have periods of sleeping and waking, suck their thumbs, and hiccups. You can often feel small contractions of the uterus. This is false labor. It is also called Braxton-Hicks contractions. This is like a practice for labor. The usual problems in this stage of pregnancy include more difficulty breathing, swelling of the hands and feet from water retention, and having to urinate more often because of the uterus and baby pressing on your bladder.  PRENATAL EXAMS  Blood work may continue to be done during prenatal exams. These tests are done to check on your health and the probable health of your baby. Blood work is used to follow your blood levels (hemoglobin). Anemia (low hemoglobin) is common during pregnancy. Iron and vitamins are given to help prevent this. You may also continue to be checked for diabetes. Some of the past blood tests may be done again.  The size of the uterus is measured during each visit. This makes sure your baby is growing properly according to your pregnancy dates.  Your blood pressure is checked every prenatal visit. This is to make sure you are not getting toxemia.  Your urine is checked every prenatal visit for infection, diabetes and protein.  Your weight is checked at each visit. This is done to make sure gains are happening at the suggested rate and that you and your baby are growing normally.  Sometimes, an ultrasound is performed to confirm the position and the proper growth and development of the baby. This is a test done that bounces harmless sound waves off the baby so your caregiver can more accurately determine due dates.  Discuss the type of pain medication  and anesthesia you will have during your labor and delivery.  Discuss the possibility and anesthesia if a Cesarean Section might be necessary.  Inform your caregiver if there is any mental or physical violence at home. Sometimes, a specialized non-stress test, contraction stress test and biophysical profile are done to make sure the baby is not having a problem. Checking the amniotic fluid surrounding the baby is called an amniocentesis. The amniotic fluid is removed by sticking a needle into the belly (abdomen). This is sometimes done near the end of pregnancy if an early delivery is required. In this case, it is done to help make sure the baby's lungs are mature enough for the baby to live outside of the womb. If the lungs are not mature and it is unsafe to deliver the baby, an injection of cortisone medication is given to the mother 1 to 2 days before the delivery. This helps the baby's lungs mature and makes it safer to deliver the baby. CHANGES OCCURING IN THE THIRD TRIMESTER OF PREGNANCY Your body goes through many changes during pregnancy. They vary from person to person. Talk to your caregiver about changes you notice and are concerned about.  During the last trimester, you have probably had an increase in your appetite. It is normal to have cravings for certain foods. This varies from person to person and pregnancy to pregnancy.  You may begin to get stretch marks on your hips, abdomen, and breasts. These are normal changes in the body   during pregnancy. There are no exercises or medications to take which prevent this change.  Constipation may be treated with a stool softener or adding bulk to your diet. Drinking lots of fluids, fiber in vegetables, fruits, and whole grains are helpful.  Exercising is also helpful. If you have been very active up until your pregnancy, most of these activities can be continued during your pregnancy. If you have been less active, it is helpful to start an  exercise program such as walking. Consult your caregiver before starting exercise programs.  Avoid all smoking, alcohol, un-prescribed drugs, herbs and "street drugs" during your pregnancy. These chemicals affect the formation and growth of the baby. Avoid chemicals throughout the pregnancy to ensure the delivery of a healthy infant.  Backache, varicose veins and hemorrhoids may develop or get worse.  You will tire more easily in the third trimester, which is normal.  The baby's movements may be stronger and more often.  You may become short of breath easily.  Your belly button may stick out.  A yellow discharge may leak from your breasts called colostrum.  You may have a bloody mucus discharge. This usually occurs a few days to a week before labor begins. HOME CARE INSTRUCTIONS   Keep your caregiver's appointments. Follow your caregiver's instructions regarding medication use, exercise, and diet.  During pregnancy, you are providing food for you and your baby. Continue to eat regular, well-balanced meals. Choose foods such as meat, fish, milk and other low fat dairy products, vegetables, fruits, and whole-grain breads and cereals. Your caregiver will tell you of the ideal weight gain.  A physical sexual relationship may be continued throughout pregnancy if there are no other problems such as early (premature) leaking of amniotic fluid from the membranes, vaginal bleeding, or belly (abdominal) pain.  Exercise regularly if there are no restrictions. Check with your caregiver if you are unsure of the safety of your exercises. Greater weight gain will occur in the last 2 trimesters of pregnancy. Exercising helps:  Control your weight.  Get you in shape for labor and delivery.  You lose weight after you deliver.  Rest a lot with legs elevated, or as needed for leg cramps or low back pain.  Wear a good support or jogging bra for breast tenderness during pregnancy. This may help if worn  during sleep. Pads or tissues may be used in the bra if you are leaking colostrum.  Do not use hot tubs, steam rooms, or saunas.  Wear your seat belt when driving. This protects you and your baby if you are in an accident.  Avoid raw meat, cat litter boxes and soil used by cats. These carry germs that can cause birth defects in the baby.  It is easier to loose urine during pregnancy. Tightening up and strengthening the pelvic muscles will help with this problem. You can practice stopping your urination while you are going to the bathroom. These are the same muscles you need to strengthen. It is also the muscles you would use if you were trying to stop from passing gas. You can practice tightening these muscles up 10 times a set and repeating this about 3 times per day. Once you know what muscles to tighten up, do not perform these exercises during urination. It is more likely to cause an infection by backing up the urine.  Ask for help if you have financial, counseling or nutritional needs during pregnancy. Your caregiver will be able to offer counseling for these   needs as well as refer you for other special needs.  Make a list of emergency phone numbers and have them available.  Plan on getting help from family or friends when you go home from the hospital.  Make a trial run to the hospital.  Take prenatal classes with the father to understand, practice and ask questions about the labor and delivery.  Prepare the baby's room/nursery.  Do not travel out of the city unless it is absolutely necessary and with the advice of your caregiver.  Wear only low or no heal shoes to have better balance and prevent falling. MEDICATIONS AND DRUG USE IN PREGNANCY  Take prenatal vitamins as directed. The vitamin should contain 1 milligram of folic acid. Keep all vitamins out of reach of children. Only a couple vitamins or tablets containing iron may be fatal to a baby or young child when  ingested.  Avoid use of all medications, including herbs, over-the-counter medications, not prescribed or suggested by your caregiver. Only take over-the-counter or prescription medicines for pain, discomfort, or fever as directed by your caregiver. Do not use aspirin, ibuprofen (Motrin, Advil, Nuprin) or naproxen (Aleve) unless OK'd by your caregiver.  Let your caregiver also know about herbs you may be using.  Alcohol is related to a number of birth defects. This includes fetal alcohol syndrome. All alcohol, in any form, should be avoided completely. Smoking will cause low birth rate and premature babies.  Street/illegal drugs are very harmful to the baby. They are absolutely forbidden. A baby born to an addicted mother will be addicted at birth. The baby will go through the same withdrawal an adult does. SEEK MEDICAL CARE IF: You have any concerns or worries during your pregnancy. It is better to call with your questions if you feel they cannot wait, rather than worry about them. DECISIONS ABOUT CIRCUMCISION You may or may not know the sex of your baby. If you know your baby is a boy, it may be time to think about circumcision. Circumcision is the removal of the foreskin of the penis. This is the skin that covers the sensitive end of the penis. There is no proven medical need for this. Often this decision is made on what is popular at the time or based upon religious beliefs and social issues. You can discuss these issues with your caregiver or pediatrician. SEEK IMMEDIATE MEDICAL CARE IF:   An unexplained oral temperature above 102 F (38.9 C) develops, or as your caregiver suggests.  You have leaking of fluid from the vagina (birth canal). If leaking membranes are suspected, take your temperature and tell your caregiver of this when you call.  There is vaginal spotting, bleeding or passing clots. Tell your caregiver of the amount and how many pads are used.  You develop a bad smelling  vaginal discharge with a change in the color from clear to white.  You develop vomiting that lasts more than 24 hours.  You develop chills or fever.  You develop shortness of breath.  You develop burning on urination.  You loose more than 2 pounds of weight or gain more than 2 pounds of weight or as suggested by your caregiver.  You notice sudden swelling of your face, hands, and feet or legs.  You develop belly (abdominal) pain. Round ligament discomfort is a common non-cancerous (benign) cause of abdominal pain in pregnancy. Your caregiver still must evaluate you.  You develop a severe headache that does not go away.  You develop visual   problems, blurred or double vision.  If you have not felt your baby move for more than 1 hour. If you think the baby is not moving as much as usual, eat something with sugar in it and lie down on your left side for an hour. The baby should move at least 4 to 5 times per hour. Call right away if your baby moves less than that.  You fall, are in a car accident or any kind of trauma.  There is mental or physical violence at home. Document Released: 09/07/2001 Document Revised: 12/06/2011 Document Reviewed: 03/12/2009 [employer] Patient Information 2013 [employer].  Breastfeeding Deciding to breastfeed is one of the best choices you can make for you and your baby. The information that follows gives a brief overview of the benefits of breastfeeding as well as common topics surrounding breastfeeding. BENEFITS OF BREASTFEEDING For the baby  The first milk (colostrum) helps the baby's digestive system function better.   There are antibodies in the mother's milk that help the baby fight off infections.   The baby has a lower incidence of asthma, allergies, and sudden infant death syndrome (SIDS).   The nutrients in breast milk are better for the baby than infant formulas, and breast milk helps the baby's brain grow better.   Babies who  breastfeed have less gas, colic, and constipation.  For the mother  Breastfeeding helps develop a very special bond between the mother and her baby.   Breastfeeding is convenient, always available at the correct temperature, and costs nothing.   Breastfeeding burns calories in the mother and helps her lose weight that was gained during pregnancy.   Breastfeeding makes the uterus contract back down to normal size faster and slows bleeding following delivery.   Breastfeeding mothers have a lower risk of developing breast cancer.  BREASTFEEDING FREQUENCY  A healthy, full-term baby may breastfeed as often as every hour or space his or her feedings to every 3 hours.   Watch your baby for signs of hunger. Nurse your baby if he or she shows signs of hunger. How often you nurse will vary from baby to baby.   Nurse as often as the baby requests, or when you feel the need to reduce the fullness of your breasts.   Awaken the baby if it has been 3 4 hours since the last feeding.   Frequent feeding will help the mother make more milk and will help prevent problems, such as sore nipples and engorgement of the breasts.  BABY'S POSITION AT THE BREAST  Whether lying down or sitting, be sure that the baby's tummy is facing your tummy.   Support the breast with 4 fingers underneath the breast and the thumb above. Make sure your fingers are well away from the nipple and baby's mouth.   Stroke the baby's lips gently with your finger or nipple.   When the baby's mouth is open wide enough, place all of your nipple and as much of the areola as possible into your baby's mouth.   Pull the baby in close so the tip of the nose and the baby's cheeks touch the breast during the feeding.  FEEDINGS AND SUCTION  The length of each feeding varies from baby to baby and from feeding to feeding.   The baby must suck about 2 3 minutes for your milk to get to him or her. This is called a "let down."  For this reason, allow the baby to feed on each breast as   long as he or she wants. Your baby will end the feeding when he or she has received the right balance of nutrients.   To break the suction, put your finger into the corner of the baby's mouth and slide it between his or her gums before removing your breast from his or her mouth. This will help prevent sore nipples.  HOW TO TELL WHETHER YOUR BABY IS GETTING ENOUGH BREAST MILK. Wondering whether or not your baby is getting enough milk is a common concern among mothers. You can be assured that your baby is getting enough milk if:   Your baby is actively sucking and you hear swallowing.   Your baby seems relaxed and satisfied after a feeding.   Your baby nurses at least 8 12 times in a 24 hour time period. Nurse your baby until he or she unlatches or falls asleep at the first breast (at least 10 20 minutes), then offer the second side.   Your baby is wetting 5 6 disposable diapers (6 8 cloth diapers) in a 24 hour period by 5 6 days of age.   Your baby is having at least 3 4 stools every 24 hours for the first 6 weeks. The stool should be soft and yellow.   Your baby should gain 4 7 ounces per week after he or she is 4 days old.   Your breasts feel softer after nursing.  REDUCING BREAST ENGORGEMENT  In the first week after your baby is born, you may experience signs of breast engorgement. When breasts are engorged, they feel heavy, warm, full, and may be tender to the touch. You can reduce engorgement if you:   Nurse frequently, every 2 3 hours. Mothers who breastfeed early and often have fewer problems with engorgement.   Place light ice packs on your breasts for 10 20 minutes between feedings. This reduces swelling. Wrap the ice packs in a lightweight towel to protect your skin. Bags of frozen vegetables work well for this purpose.   Take a warm shower or apply warm, moist heat to your breast for 5 10 minutes just before  each feeding. This increases circulation and helps the milk flow.   Gently massage your breast before and during the feeding. Using your finger tips, massage from the chest wall towards your nipple in a circular motion.   Make sure that the baby empties at least one breast at every feeding before switching sides.   Use a breast pump to empty the breasts if your baby is sleepy or not nursing well. You may also want to pump if you are returning to work oryou feel you are getting engorged.   Avoid bottle feeds, pacifiers, or supplemental feedings of water or juice in place of breastfeeding. Breast milk is all the food your baby needs. It is not necessary for your baby to have water or formula. In fact, to help your breasts make more milk, it is best not to give your baby supplemental feedings during the early weeks.   Be sure the baby is latched on and positioned properly while breastfeeding.   Wear a supportive bra, avoiding underwire styles.   Eat a balanced diet with enough fluids.   Rest often, relax, and take your prenatal vitamins to prevent fatigue, stress, and anemia.  If you follow these suggestions, your engorgement should improve in 24 48 hours. If you are still experiencing difficulty, call your lactation consultant or caregiver.  CARING FOR YOURSELF Take care of your   breasts  Bathe or shower daily.   Avoid using soap on your nipples.   Start feedings on your left breast at one feeding and on your right breast at the next feeding.   You will notice an increase in your milk supply 2 5 days after delivery. You may feel some discomfort from engorgement, which makes your breasts very firm and often tender. Engorgement "peaks" out within 24 48 hours. In the meantime, apply warm moist towels to your breasts for 5 10 minutes before feeding. Gentle massage and expression of some milk before feeding will soften your breasts, making it easier for your baby to latch on.    Wear a well-fitting nursing bra, and air dry your nipples for a 3 4minutes after each feeding.   Only use cotton bra pads.   Only use pure lanolin on your nipples after nursing. You do not need to wash it off before feeding the baby again. Another option is to express a few drops of breast milk and gently massage it into your nipples.  Take care of yourself  Eat well-balanced meals and nutritious snacks.   Drinking milk, fruit juice, and water to satisfy your thirst (about 8 glasses a day).   Get plenty of rest.  Avoid foods that you notice affect the baby in a bad way.  SEEK MEDICAL CARE IF:   You have difficulty with breastfeeding and need help.   You have a hard, red, sore area on your breast that is accompanied by a fever.   Your baby is too sleepy to eat well or is having trouble sleeping.   Your baby is wetting less than 6 diapers a day, by 5 days of age.   Your baby's skin or white part of his or her eyes is more yellow than it was in the hospital.   You feel depressed.  Document Released: 09/13/2005 Document Revised: 03/14/2012 Document Reviewed: 12/12/2011 [employer] Patient Information 2013 [employer].  

## 2013-02-01 NOTE — Progress Notes (Signed)
Doing well today--Has h/o SGA with HTN last pregnancy.  BP is good for now.

## 2013-02-02 NOTE — MAU Provider Note (Signed)
Attestation of Attending Supervision of Advanced Practitioner (PA/CNM/NP): Evaluation and management procedures were performed by the Advanced Practitioner under my supervision and collaboration.  I have reviewed the Advanced Practitioner's note and chart, and I agree with the management and plan.  Othell Diluzio, MD, FACOG Attending Obstetrician & Gynecologist Faculty Practice, [facility]  

## 2013-02-05 ENCOUNTER — Other Ambulatory Visit: Payer: Medicaid Other

## 2013-02-05 DIAGNOSIS — R7309 Other abnormal glucose: Secondary | ICD-10-CM

## 2013-02-06 ENCOUNTER — Encounter: Payer: Self-pay | Admitting: Obstetrics and Gynecology

## 2013-02-15 ENCOUNTER — Encounter: Payer: Medicaid Other | Admitting: Obstetrics & Gynecology

## 2013-02-22 ENCOUNTER — Ambulatory Visit (INDEPENDENT_AMBULATORY_CARE_PROVIDER_SITE_OTHER): Payer: Medicaid Other | Admitting: Obstetrics & Gynecology

## 2013-02-22 VITALS — BP 126/82 | Temp 97.6°F | Wt 154.5 lb

## 2013-02-22 DIAGNOSIS — D573 Sickle-cell trait: Secondary | ICD-10-CM

## 2013-02-22 DIAGNOSIS — O09299 Supervision of pregnancy with other poor reproductive or obstetric history, unspecified trimester: Secondary | ICD-10-CM

## 2013-02-22 DIAGNOSIS — O0990 Supervision of high risk pregnancy, unspecified, unspecified trimester: Secondary | ICD-10-CM

## 2013-02-22 LAB — POCT URINALYSIS DIP (DEVICE)
Ketones, ur: NEGATIVE mg/dL
Protein, ur: NEGATIVE mg/dL
Specific Gravity, Urine: 1.02 (ref 1.005–1.030)
pH: 6 (ref 5.0–8.0)

## 2013-02-22 NOTE — Progress Notes (Signed)
No complaint normal FH  [place] scheduled

## 2013-02-22 NOTE — Patient Instructions (Signed)

## 2013-02-22 NOTE — Progress Notes (Signed)
Pulse- 101  Edema- legs   Pain-"when baby moves" [place] interpreter # [phone]

## 2013-02-22 NOTE — Progress Notes (Signed)
U/S scheduled 02/28/13 at 245 pm. °

## 2013-02-28 ENCOUNTER — Ambulatory Visit (HOSPITAL_COMMUNITY)
Admission: RE | Admit: 2013-02-28 | Discharge: 2013-02-28 | Disposition: A | Discharge status: Home / Self Care | Payer: Medicaid Other | Source: Ambulatory Visit | Attending: Obstetrics & Gynecology | Admitting: Obstetrics & Gynecology

## 2013-02-28 DIAGNOSIS — Z8751 Personal history of pre-term labor: Secondary | ICD-10-CM | POA: Insufficient documentation

## 2013-02-28 DIAGNOSIS — Z3689 Encounter for other specified antenatal screening: Secondary | ICD-10-CM | POA: Insufficient documentation

## 2013-02-28 DIAGNOSIS — O09299 Supervision of pregnancy with other poor reproductive or obstetric history, unspecified trimester: Secondary | ICD-10-CM | POA: Insufficient documentation

## 2013-02-28 DIAGNOSIS — O0990 Supervision of high risk pregnancy, unspecified, unspecified trimester: Secondary | ICD-10-CM

## 2013-03-08 ENCOUNTER — Ambulatory Visit (INDEPENDENT_AMBULATORY_CARE_PROVIDER_SITE_OTHER): Payer: Medicaid Other | Admitting: Family

## 2013-03-08 VITALS — BP 129/89 | Temp 97.9°F | Wt 157.8 lb

## 2013-03-08 DIAGNOSIS — Z3493 Encounter for supervision of normal pregnancy, unspecified, third trimester: Secondary | ICD-10-CM

## 2013-03-08 DIAGNOSIS — O09299 Supervision of pregnancy with other poor reproductive or obstetric history, unspecified trimester: Secondary | ICD-10-CM

## 2013-03-08 LAB — POCT URINALYSIS DIP (DEVICE)
Protein, ur: NEGATIVE mg/dL
Specific Gravity, Urine: 1.02 (ref 1.005–1.030)
Urobilinogen, UA: 0.2 mg/dL (ref 0.0–1.0)
pH: 6 (ref 5.0–8.0)

## 2013-03-08 NOTE — Progress Notes (Signed)
Reviewed ultrasound results EFW 82nd percentile at 33 wks.  No questions or concerns.

## 2013-03-08 NOTE — Progress Notes (Signed)
Pulse- 93 Interpreter [phone]

## 2013-03-12 LAB — OB RESULTS CONSOLE GBS: GBS: POSITIVE

## 2013-03-22 ENCOUNTER — Encounter: Payer: Self-pay | Admitting: Obstetrics and Gynecology

## 2013-03-22 ENCOUNTER — Ambulatory Visit (INDEPENDENT_AMBULATORY_CARE_PROVIDER_SITE_OTHER): Payer: Medicaid Other | Admitting: Obstetrics and Gynecology

## 2013-03-22 VITALS — BP 134/90 | Wt 160.1 lb

## 2013-03-22 DIAGNOSIS — D573 Sickle-cell trait: Secondary | ICD-10-CM

## 2013-03-22 DIAGNOSIS — O09293 Supervision of pregnancy with other poor reproductive or obstetric history, third trimester: Secondary | ICD-10-CM

## 2013-03-22 DIAGNOSIS — O0993 Supervision of high risk pregnancy, unspecified, third trimester: Secondary | ICD-10-CM

## 2013-03-22 DIAGNOSIS — O09299 Supervision of pregnancy with other poor reproductive or obstetric history, unspecified trimester: Secondary | ICD-10-CM

## 2013-03-22 LAB — POCT URINALYSIS DIP (DEVICE)
Hgb urine dipstick: NEGATIVE
Nitrite: NEGATIVE
Urobilinogen, UA: 0.2 mg/dL (ref 0.0–1.0)
pH: 6.5 (ref 5.0–8.0)

## 2013-03-22 NOTE — Progress Notes (Signed)
Patient doing well without complaints. FM/PTL precautions reviewed. Cultures today

## 2013-03-22 NOTE — Progress Notes (Signed)
Pulse: 85

## 2013-03-23 LAB — GC/CHLAMYDIA PROBE AMP: CT Probe RNA: NEGATIVE

## 2013-03-26 ENCOUNTER — Encounter: Payer: Self-pay | Admitting: Obstetrics and Gynecology

## 2013-03-29 ENCOUNTER — Ambulatory Visit (INDEPENDENT_AMBULATORY_CARE_PROVIDER_SITE_OTHER): Payer: Medicaid Other | Admitting: Advanced Practice Midwife

## 2013-03-29 VITALS — BP 132/93 | Temp 98.6°F | Wt 158.8 lb

## 2013-03-29 DIAGNOSIS — O10019 Pre-existing essential hypertension complicating pregnancy, unspecified trimester: Secondary | ICD-10-CM

## 2013-03-29 DIAGNOSIS — O10913 Unspecified pre-existing hypertension complicating pregnancy, third trimester: Secondary | ICD-10-CM

## 2013-03-29 DIAGNOSIS — O09299 Supervision of pregnancy with other poor reproductive or obstetric history, unspecified trimester: Secondary | ICD-10-CM | POA: Insufficient documentation

## 2013-03-29 LAB — POCT URINALYSIS DIP (DEVICE)
Bilirubin Urine: NEGATIVE
Hgb urine dipstick: NEGATIVE
Ketones, ur: NEGATIVE mg/dL
pH: 6.5 (ref 5.0–8.0)

## 2013-03-29 NOTE — Progress Notes (Signed)
[place] interpreter [phone] Pulse- 87

## 2013-03-29 NOTE — Progress Notes (Signed)
Reviewed pos GBS. No PIH Sx.

## 2013-03-29 NOTE — Progress Notes (Signed)
Per consult w/ Dr. Macon Large start antenatal testing.

## 2013-03-29 NOTE — Patient Instructions (Signed)
Hypertension During Pregnancy Hypertension is also called high blood pressure. Blood pressure moves blood in your body. Sometimes, the force that moves the blood becomes too strong. When you are pregnant, this condition should be watched carefully. It can cause problems for you and your baby. HOME CARE   Make and keep all of your doctor visits.  Take medicine as told by your doctor. Tell your doctor about all medicines you take.  Eat very little salt.  Exercise regularly.  Do not drink alcohol.  Do not smoke.  Do not have drinks with caffeine.  Lie on your left side when resting. GET HELP RIGHT AWAY IF:  You have bad belly (abdominal) pain.  You have sudden puffiness (swelling) in the hands, ankles, or face.  You gain 4 pounds (1.8 kilograms) or more in 1 week.  You throw up (vomit) repeatedly.  You have bleeding from the vagina.  You do not feel the baby moving as much.  You have a headache.  You have blurred or double vision.  You have muscle twitching or spasms.  You have shortness of breath.  You have blue fingernails and lips.  You have blood in your pee (urine). MAKE SURE YOU:  Understand these instructions.  Will watch your condition.  Will get help right away if you are not doing well. Document Released: 10/16/2010 Document Revised: 12/06/2011 Document Reviewed: 04/30/2011 [facility] Patient Information 2014 [place], [place].  Fetal Movement Counts Patient Name: __________________________________________________ Patient Due Date: ____________________ Performing a fetal movement count is highly recommended in high-risk pregnancies, but it is good for every pregnant woman to do. Your caregiver may ask you to start counting fetal movements at 28 weeks of the pregnancy. Fetal movements often increase:  After eating a full meal.  After physical activity.  After eating or drinking something sweet or cold.  At rest. Pay attention to when you feel the  baby is most active. This will help you notice a pattern of your baby's sleep and wake cycles and what factors contribute to an increase in fetal movement. It is important to perform a fetal movement count at the same time each day when your baby is normally most active.  HOW TO COUNT FETAL MOVEMENTS 1. Find a quiet and comfortable area to sit or lie down on your left side. Lying on your left side provides the best blood and oxygen circulation to your baby. 2. Write down the day and time on a sheet of paper or in a journal. 3. Start counting kicks, flutters, swishes, rolls, or jabs in a 2 hour period. You should feel at least 10 movements within 2 hours. 4. If you do not feel 10 movements in 2 hours, wait 2 3 hours and count again. Look for a change in the pattern or not enough counts in 2 hours. SEEK MEDICAL CARE IF:  You feel less than 10 counts in 2 hours, tried twice.  There is no movement in over an hour.  The pattern is changing or taking longer each day to reach 10 counts in 2 hours.  You feel the baby is not moving as he or she usually does. Date: ____________ Movements: ____________ Start time: ____________ Doreatha Martin time: ____________  Date: ____________ Movements: ____________ Start time: ____________ Doreatha Martin time: ____________ Date: ____________ Movements: ____________ Start time: ____________ Doreatha Martin time: ____________ Date: ____________ Movements: ____________ Start time: ____________ Doreatha Martin time: ____________ Date: ____________ Movements: ____________ Start time: ____________ Doreatha Martin time: ____________ Date: ____________ Movements: ____________ Start time: ____________ Doreatha Martin  time: ____________ Date: ____________ Movements: ____________ Start time: ____________ Doreatha Martin time: ____________ Date: ____________ Movements: ____________ Start time: ____________ Doreatha Martin time: ____________  Date: ____________ Movements: ____________ Start time: ____________ Doreatha Martin time: ____________ Date:  ____________ Movements: ____________ Start time: ____________ Doreatha Martin time: ____________ Date: ____________ Movements: ____________ Start time: ____________ Doreatha Martin time: ____________ Date: ____________ Movements: ____________ Start time: ____________ Doreatha Martin time: ____________ Date: ____________ Movements: ____________ Start time: ____________ Doreatha Martin time: ____________ Date: ____________ Movements: ____________ Start time: ____________ Doreatha Martin time: ____________ Date: ____________ Movements: ____________ Start time: ____________ Doreatha Martin time: ____________  Date: ____________ Movements: ____________ Start time: ____________ Doreatha Martin time: ____________ Date: ____________ Movements: ____________ Start time: ____________ Doreatha Martin time: ____________ Date: ____________ Movements: ____________ Start time: ____________ Doreatha Martin time: ____________ Date: ____________ Movements: ____________ Start time: ____________ Doreatha Martin time: ____________ Date: ____________ Movements: ____________ Start time: ____________ Doreatha Martin time: ____________ Date: ____________ Movements: ____________ Start time: ____________ Doreatha Martin time: ____________ Date: ____________ Movements: ____________ Start time: ____________ Doreatha Martin time: ____________  Date: ____________ Movements: ____________ Start time: ____________ Doreatha Martin time: ____________ Date: ____________ Movements: ____________ Start time: ____________ Doreatha Martin time: ____________ Date: ____________ Movements: ____________ Start time: ____________ Doreatha Martin time: ____________ Date: ____________ Movements: ____________ Start time: ____________ Doreatha Martin time: ____________ Date: ____________ Movements: ____________ Start time: ____________ Doreatha Martin time: ____________ Date: ____________ Movements: ____________ Start time: ____________ Doreatha Martin time: ____________ Date: ____________ Movements: ____________ Start time: ____________ Doreatha Martin time: ____________  Date: ____________ Movements: ____________ Start  time: ____________ Doreatha Martin time: ____________ Date: ____________ Movements: ____________ Start time: ____________ Doreatha Martin time: ____________ Date: ____________ Movements: ____________ Start time: ____________ Doreatha Martin time: ____________ Date: ____________ Movements: ____________ Start time: ____________ Doreatha Martin time: ____________ Date: ____________ Movements: ____________ Start time: ____________ Doreatha Martin time: ____________ Date: ____________ Movements: ____________ Start time: ____________ Doreatha Martin time: ____________ Date: ____________ Movements: ____________ Start time: ____________ Doreatha Martin time: ____________  Date: ____________ Movements: ____________ Start time: ____________ Doreatha Martin time: ____________ Date: ____________ Movements: ____________ Start time: ____________ Doreatha Martin time: ____________ Date: ____________ Movements: ____________ Start time: ____________ Doreatha Martin time: ____________ Date: ____________ Movements: ____________ Start time: ____________ Doreatha Martin time: ____________ Date: ____________ Movements: ____________ Start time: ____________ Doreatha Martin time: ____________ Date: ____________ Movements: ____________ Start time: ____________ Doreatha Martin time: ____________ Date: ____________ Movements: ____________ Start time: ____________ Doreatha Martin time: ____________  Date: ____________ Movements: ____________ Start time: ____________ Doreatha Martin time: ____________ Date: ____________ Movements: ____________ Start time: ____________ Doreatha Martin time: ____________ Date: ____________ Movements: ____________ Start time: ____________ Doreatha Martin time: ____________ Date: ____________ Movements: ____________ Start time: ____________ Doreatha Martin time: ____________ Date: ____________ Movements: ____________ Start time: ____________ Doreatha Martin time: ____________ Date: ____________ Movements: ____________ Start time: ____________ Doreatha Martin time: ____________ Date: ____________ Movements: ____________ Start time: ____________ Doreatha Martin time: ____________    Date: ____________ Movements: ____________ Start time: ____________ Doreatha Martin time: ____________ Date: ____________ Movements: ____________ Start time: ____________ Doreatha Martin time: ____________ Date: ____________ Movements: ____________ Start time: ____________ Doreatha Martin time: ____________ Date: ____________ Movements: ____________ Start time: ____________ Doreatha Martin time: ____________ Date: ____________ Movements: ____________ Start time: ____________ Doreatha Martin time: ____________ Date: ____________ Movements: ____________ Start time: ____________ Doreatha Martin time: ____________ Document Released: 10/13/2006 Document Revised: 08/30/2012 Document Reviewed: 07/10/2012 [employer] Patient Information 2014 [place].

## 2013-04-05 ENCOUNTER — Encounter: Payer: Self-pay | Admitting: Obstetrics & Gynecology

## 2013-04-05 ENCOUNTER — Ambulatory Visit (INDEPENDENT_AMBULATORY_CARE_PROVIDER_SITE_OTHER): Payer: Medicaid Other | Admitting: Obstetrics & Gynecology

## 2013-04-05 VITALS — BP 136/91 | Wt 159.4 lb

## 2013-04-05 DIAGNOSIS — O10913 Unspecified pre-existing hypertension complicating pregnancy, third trimester: Secondary | ICD-10-CM

## 2013-04-05 DIAGNOSIS — O10019 Pre-existing essential hypertension complicating pregnancy, unspecified trimester: Secondary | ICD-10-CM

## 2013-04-05 LAB — POCT URINALYSIS DIP (DEVICE)
Ketones, ur: NEGATIVE mg/dL
Protein, ur: NEGATIVE mg/dL
Specific Gravity, Urine: 1.015 (ref 1.005–1.030)

## 2013-04-05 NOTE — Progress Notes (Signed)
No complaints, BP stable. Start 2/week NST today

## 2013-04-05 NOTE — Patient Instructions (Signed)

## 2013-04-05 NOTE — Progress Notes (Signed)
Ob US for growth/AFI scheduled on 7/15.

## 2013-04-05 NOTE — Progress Notes (Signed)
P=71,  

## 2013-04-10 ENCOUNTER — Ambulatory Visit (INDEPENDENT_AMBULATORY_CARE_PROVIDER_SITE_OTHER): Payer: Medicaid Other | Admitting: *Deleted

## 2013-04-10 ENCOUNTER — Ambulatory Visit (HOSPITAL_COMMUNITY)
Admission: RE | Admit: 2013-04-10 | Discharge: 2013-04-10 | Disposition: A | Discharge status: Home / Self Care | Payer: Medicaid Other | Source: Ambulatory Visit | Attending: Advanced Practice Midwife | Admitting: Advanced Practice Midwife

## 2013-04-10 VITALS — BP 132/88

## 2013-04-10 DIAGNOSIS — O10019 Pre-existing essential hypertension complicating pregnancy, unspecified trimester: Secondary | ICD-10-CM | POA: Insufficient documentation

## 2013-04-10 DIAGNOSIS — O10013 Pre-existing essential hypertension complicating pregnancy, third trimester: Secondary | ICD-10-CM

## 2013-04-10 DIAGNOSIS — Z8751 Personal history of pre-term labor: Secondary | ICD-10-CM | POA: Insufficient documentation

## 2013-04-10 DIAGNOSIS — O09293 Supervision of pregnancy with other poor reproductive or obstetric history, third trimester: Secondary | ICD-10-CM

## 2013-04-10 DIAGNOSIS — O0992 Supervision of high risk pregnancy, unspecified, second trimester: Secondary | ICD-10-CM

## 2013-04-10 DIAGNOSIS — O09299 Supervision of pregnancy with other poor reproductive or obstetric history, unspecified trimester: Secondary | ICD-10-CM | POA: Insufficient documentation

## 2013-04-10 DIAGNOSIS — O10913 Unspecified pre-existing hypertension complicating pregnancy, third trimester: Secondary | ICD-10-CM

## 2013-04-10 NOTE — Progress Notes (Signed)
NST reviewed and reactive.  

## 2013-04-10 NOTE — Progress Notes (Signed)
P = 72      Pt denies H/A, blurred vision and dizziness.  Pt had [place] for growth today;  EFW = 68%,  AFI = 12.44 cm.  Language Resources interpreter present for visit.  Labor sx reviewed- pt denied pain with current UC's.

## 2013-04-11 ENCOUNTER — Encounter (HOSPITAL_COMMUNITY): Payer: Self-pay

## 2013-04-11 ENCOUNTER — Encounter (HOSPITAL_COMMUNITY): Payer: Self-pay | Admitting: *Deleted

## 2013-04-11 ENCOUNTER — Inpatient Hospital Stay (HOSPITAL_COMMUNITY)
Admission: AD | Admit: 2013-04-11 | Discharge: 2013-04-11 | Disposition: A | Discharge status: Home / Self Care | Payer: Medicaid Other | Source: Ambulatory Visit | Attending: Family Medicine | Admitting: Family Medicine

## 2013-04-11 ENCOUNTER — Telehealth (HOSPITAL_COMMUNITY): Payer: Self-pay | Admitting: *Deleted

## 2013-04-11 DIAGNOSIS — O471 False labor at or after 37 completed weeks of gestation: Secondary | ICD-10-CM

## 2013-04-11 DIAGNOSIS — O133 Gestational [pregnancy-induced] hypertension without significant proteinuria, third trimester: Secondary | ICD-10-CM

## 2013-04-11 DIAGNOSIS — O0992 Supervision of high risk pregnancy, unspecified, second trimester: Secondary | ICD-10-CM

## 2013-04-11 DIAGNOSIS — O09293 Supervision of pregnancy with other poor reproductive or obstetric history, third trimester: Secondary | ICD-10-CM

## 2013-04-11 LAB — COMPREHENSIVE METABOLIC PANEL
ALT: 14 U/L (ref 0–35)
Alkaline Phosphatase: 179 U/L — ABNORMAL HIGH (ref 39–117)
CO2: 21 mEq/L (ref 19–32)
Calcium: 9.1 mg/dL (ref 8.4–10.5)
Chloride: 103 mEq/L (ref 96–112)
GFR calc Af Amer: >90 mL/min (ref >90)
GFR calc non Af Amer: >90 mL/min (ref >90)
Glucose, Bld: 88 mg/dL (ref 70–99)
Potassium: 3.9 mEq/L (ref 3.5–5.1)
Sodium: 136 mEq/L (ref 135–145)
Total Bilirubin: 0.3 mg/dL (ref 0.3–1.2)

## 2013-04-11 LAB — CBC WITH DIFFERENTIAL/PLATELET
Basophils Absolute: 0 10*3/uL (ref 0.0–0.1)
Basophils Relative: 0 % (ref 0–1)
Lymphocytes Relative: 32 % (ref 12–46)
MCHC: 36 g/dL (ref 30.0–36.0)
Neutro Abs: 4.1 10*3/uL (ref 1.7–7.7)
Neutrophils Relative %: 51 % (ref 43–77)
RDW: 13 % (ref 11.5–15.5)
WBC: 8.1 10*3/uL (ref 4.0–10.5)

## 2013-04-11 MED ORDER — ZOLPIDEM TARTRATE ER 6.25 MG PO TBCR: 6.2500 mg | EXTENDED_RELEASE_TABLET | Freq: Every evening | ORAL | Status: DC | PRN

## 2013-04-11 NOTE — MAU Note (Signed)
Dr Waynetta Sandy verified pt strip. Pt okay for d/c.

## 2013-04-11 NOTE — MAU Provider Note (Signed)
History     CSN: [id number]  Arrival date and time: 04/11/13 0043   None     Chief Complaint  Patient presents with  . Labor Eval   HPI 27 y.o. [id number] [redacted]w[redacted]d presents for labor eval. Contractions started around 5 hours ago at 2100, started as q15 min but now more like q5. No gush of fluid, no bleeding, no vaginal discharge. Denies headache, but does say her vision has changed and looks like "fog over entire vision", felt dizzy earlier today but is not right now even when standing up. Denies RUQ pain.  Prenatal course: Care at [facility] - Normal quad - Normal anatomy - Some increased BPs in last few visits   OB History   Grav Para Term Preterm Abortions TAB SAB Ect Mult Living   3 2 1 1  0 0    1    Previous pregnancies complicated by high blood pressure and premature IOL at [redacted]w[redacted]d for HTN   Past Medical History  Diagnosis Date  . Gestational HTN   . Pregnancy induced hypertension   . Chronic hypertension in pregnancy 03/29/2013    Start antenatal testing at 37 weeks.      Past Surgical History  Procedure Laterality Date  . No past surgeries      Family History  Problem Relation Age of Onset  . Hypertension Father     History  Substance Use Topics  . Smoking status: Never Smoker   . Smokeless tobacco: Never Used  . Alcohol Use: No    Allergies: No Known Allergies  Prescriptions prior to admission  Medication Sig Dispense Refill  . Elastic Bandages & Supports (WRIST SPLINT) MISC 2 each by Does not apply route at bedtime.  2 each  0  . famotidine (PEPCID) 20 MG tablet Take 1 tablet (20 mg total) by mouth 2 (two) times daily.  60 tablet  6  . Prenatal Vit-Fe Fumarate-FA (PRENATAL MULTIVITAMIN) TABS Take 1 tablet by mouth daily.  30 tablet  5  . traMADol (ULTRAM) 50 MG tablet Take 2 tablets (100 mg total) by mouth every 6 (six) hours as needed for pain.  40 tablet  0    ROS negative except as above Physical Exam   Blood pressure 138/95, pulse 71, temperature 97.6  F (36.4 C), temperature source Oral, resp. rate 18, height 5' (1.524 m), weight 73.029 kg (161 lb), last menstrual period 07/11/2012, SpO2 100.00%.  Physical Exam General appearance: alert, cooperative and no distress Head: Normocephalic, without obvious abnormality, atraumatic Lungs: clear to auscultation bilaterally Heart: regular rate and rhythm, S1, S2 normal, no murmur, click, rub or gallop Abdomen: gravid, some tenderness to palpation Extremities: extremities normal, atraumatic, no cyanosis or edema Pulses: 2+ and symmetric Skin: warm and dry Reflexes: 1+ patellar bilaterally, 1+ achilles bilaterally, 1+ biceps bilaterally  Dilation: 1 Effacement (%): 50 Cervical Position: Posterior Station: Ballotable Exam by:: D Simpson RN  FHT: 150bpm, mod var, accels present, no decels Toco: irregular ctx  MAU Course  Procedures Results for orders placed during the hospital encounter of 04/11/13 (from the past 24 hour(s))  COMPREHENSIVE METABOLIC PANEL     Status: Abnormal   Collection Time    04/11/13  1:55 AM      Result Value Range   Sodium 136  135 - 145 mEq/L   Potassium 3.9  3.5 - 5.1 mEq/L   Chloride 103  96 - 112 mEq/L   CO2 21  19 - 32 mEq/L   Glucose, Bld  88  70 - 99 mg/dL   BUN 4 (*) 6 - 23 mg/dL   Creatinine, Ser 1.61  0.50 - 1.10 mg/dL   Calcium 9.1  8.4 - 09.6 mg/dL   Total Protein 6.1  6.0 - 8.3 g/dL   Albumin 2.9 (*) 3.5 - 5.2 g/dL   AST 17  0 - 37 U/L   ALT 14  0 - 35 U/L   Alkaline Phosphatase 179 (*) 39 - 117 U/L   Total Bilirubin 0.3  0.3 - 1.2 mg/dL   GFR calc non Af Amer >90  >90 mL/min   GFR calc Af Amer >90  >90 mL/min  CBC WITH DIFFERENTIAL     Status: Abnormal   Collection Time    04/11/13  1:55 AM      Result Value Range   WBC 8.1  4.0 - 10.5 K/uL   RBC 4.44  3.87 - 5.11 MIL/uL   Hemoglobin 13.4  12.0 - 15.0 g/dL   HCT 04.5  40.9 - 81.1 %   MCV 83.8  78.0 - 100.0 fL   MCH 30.2  26.0 - 34.0 pg   MCHC 36.0  30.0 - 36.0 g/dL   RDW 91.4  78.2  - 95.6 %   Platelets 266  150 - 400 K/uL   Neutrophils Relative % 51  43 - 77 %   Neutro Abs 4.1  1.7 - 7.7 K/uL   Lymphocytes Relative 32  12 - 46 %   Lymphs Abs 2.6  0.7 - 4.0 K/uL   Monocytes Relative 9  3 - 12 %   Monocytes Absolute 0.7  0.1 - 1.0 K/uL   Eosinophils Relative 7 (*) 0 - 5 %   Eosinophils Absolute 0.6  0.0 - 0.7 K/uL   Basophils Relative 0  0 - 1 %   Basophils Absolute 0.0  0.0 - 0.1 K/uL  PROTEIN / CREATININE RATIO, URINE     Status: None   Collection Time    04/11/13  2:20 AM      Result Value Range   Creatinine, Urine 40.20     Total Protein, Urine 4.4     PROTEIN CREATININE RATIO 0.11  0.00 - 0.15    MDM   Assessment and Plan   # Gestational Hypertension - PIH labs normal, pending Urine Pr:Cr - FHT reassuring - Scheduled for IOL on 7/17 at 1930 - Ambien rx given for sleep  Stable for discharge  Tawni Carnes 04/11/2013, 1:52 AM   I have participated in the care of this patient and I agree with the above. Dr Shawnie Pons consulted re plan of care: given nl preeclampsia labs, stable BPs, and reactive tracing pt is to be scheduled for IOL in 1-2 days and to return sooner with s/s preeclampsia. Cam Hai 8:48 AM 04/11/2013

## 2013-04-11 NOTE — MAU Note (Signed)
Dr Waynetta Sandy notified of pt strip before d/c. Watch pt for 5-10 minutes and d/c pt home

## 2013-04-11 NOTE — Telephone Encounter (Signed)
Preadmission screen Interpreter number [phone]

## 2013-04-11 NOTE — MAU Note (Signed)
Contractions, denies bleeding or ROM 

## 2013-04-12 ENCOUNTER — Inpatient Hospital Stay (HOSPITAL_COMMUNITY)
Admission: RE | Admit: 2013-04-12 | Discharge: 2013-04-15 | DRG: 775 | Disposition: A | Discharge status: Home / Self Care | Payer: Medicaid Other | Source: Ambulatory Visit | Attending: Obstetrics & Gynecology | Admitting: Obstetrics & Gynecology

## 2013-04-12 ENCOUNTER — Ambulatory Visit (INDEPENDENT_AMBULATORY_CARE_PROVIDER_SITE_OTHER): Payer: Medicaid Other | Admitting: Obstetrics & Gynecology

## 2013-04-12 ENCOUNTER — Encounter (HOSPITAL_COMMUNITY): Payer: Self-pay

## 2013-04-12 VITALS — BP 123/76 | HR 93 | Temp 98.8°F | Resp 18 | Ht 60.0 in | Wt 159.0 lb

## 2013-04-12 VITALS — BP 140/89 | Wt 159.1 lb

## 2013-04-12 DIAGNOSIS — O09293 Supervision of pregnancy with other poor reproductive or obstetric history, third trimester: Secondary | ICD-10-CM

## 2013-04-12 DIAGNOSIS — O99892 Other specified diseases and conditions complicating childbirth: Secondary | ICD-10-CM | POA: Diagnosis present

## 2013-04-12 DIAGNOSIS — O9903 Anemia complicating the puerperium: Secondary | ICD-10-CM | POA: Diagnosis not present

## 2013-04-12 DIAGNOSIS — O139 Gestational [pregnancy-induced] hypertension without significant proteinuria, unspecified trimester: Principal | ICD-10-CM | POA: Diagnosis present

## 2013-04-12 DIAGNOSIS — Z22330 Carrier of Group B streptococcus: Secondary | ICD-10-CM

## 2013-04-12 DIAGNOSIS — D649 Anemia, unspecified: Secondary | ICD-10-CM | POA: Diagnosis not present

## 2013-04-12 DIAGNOSIS — O10019 Pre-existing essential hypertension complicating pregnancy, unspecified trimester: Secondary | ICD-10-CM

## 2013-04-12 DIAGNOSIS — O10013 Pre-existing essential hypertension complicating pregnancy, third trimester: Secondary | ICD-10-CM

## 2013-04-12 LAB — POCT URINALYSIS DIP (DEVICE)
Bilirubin Urine: NEGATIVE
Glucose, UA: NEGATIVE mg/dL
Ketones, ur: NEGATIVE mg/dL
Nitrite: NEGATIVE

## 2013-04-12 LAB — CBC
Hemoglobin: 13.3 g/dL (ref 12.0–15.0)
MCHC: 35.3 g/dL (ref 30.0–36.0)
RBC: 4.46 MIL/uL (ref 3.87–5.11)
WBC: 8.5 10*3/uL (ref 4.0–10.5)

## 2013-04-12 MED ORDER — ONDANSETRON HCL 4 MG/2ML IJ SOLN: 4.0000 mg | Freq: Four times a day (QID) | INTRAMUSCULAR | Status: DC | PRN

## 2013-04-12 MED ORDER — ACETAMINOPHEN 325 MG PO TABS: 650.0000 mg | ORAL_TABLET | ORAL | Status: DC | PRN

## 2013-04-12 MED ORDER — OXYTOCIN BOLUS FROM INFUSION: 500.0000 mL | INTRAVENOUS | Status: DC

## 2013-04-12 MED ORDER — PENICILLIN G POTASSIUM 5000000 UNITS IJ SOLR
2.5000 10*6.[IU] | INTRAVENOUS | Status: DC
  Administered 2013-04-13 (×5): 2.5 10*6.[IU] via INTRAVENOUS
  Filled 2013-04-12 (×8): qty 2.5

## 2013-04-12 MED ORDER — OXYCODONE-ACETAMINOPHEN 5-325 MG PO TABS: 1.0000 | ORAL_TABLET | ORAL | Status: DC | PRN

## 2013-04-12 MED ORDER — LIDOCAINE HCL (PF) 1 % IJ SOLN
30.0000 mL | INTRAMUSCULAR | Status: DC | PRN
  Filled 2013-04-12: qty 30

## 2013-04-12 MED ORDER — OXYTOCIN 40 UNITS IN LACTATED RINGERS INFUSION - SIMPLE MED
1.0000 m[IU]/min | INTRAVENOUS | Status: DC
  Administered 2013-04-13: 2 m[IU]/min via INTRAVENOUS
  Filled 2013-04-12: qty 1000

## 2013-04-12 MED ORDER — IBUPROFEN 600 MG PO TABS
600.0000 mg | ORAL_TABLET | Freq: Four times a day (QID) | ORAL | Status: DC | PRN
  Administered 2013-04-13: 600 mg via ORAL
  Filled 2013-04-12: qty 1

## 2013-04-12 MED ORDER — FENTANYL CITRATE 0.05 MG/ML IJ SOLN
100.0000 ug | INTRAMUSCULAR | Status: DC | PRN
  Administered 2013-04-12 – 2013-04-13 (×3): 100 ug via INTRAVENOUS
  Filled 2013-04-12 (×3): qty 2

## 2013-04-12 MED ORDER — TERBUTALINE SULFATE 1 MG/ML IJ SOLN: 0.2500 mg | Freq: Once | INTRAMUSCULAR | Status: AC | PRN

## 2013-04-12 MED ORDER — OXYTOCIN 40 UNITS IN LACTATED RINGERS INFUSION - SIMPLE MED: 62.5000 mL/h | INTRAVENOUS | Status: DC

## 2013-04-12 MED ORDER — LACTATED RINGERS IV SOLN
INTRAVENOUS | Status: DC
  Administered 2013-04-12 – 2013-04-13 (×2): via INTRAVENOUS

## 2013-04-12 MED ORDER — LACTATED RINGERS IV SOLN
500.0000 mL | INTRAVENOUS | Status: DC | PRN
  Administered 2013-04-13: 500 mL via INTRAVENOUS

## 2013-04-12 MED ORDER — CITRIC ACID-SODIUM CITRATE 334-500 MG/5ML PO SOLN
30.0000 mL | ORAL | Status: DC | PRN
  Filled 2013-04-12: qty 15

## 2013-04-12 MED ORDER — PENICILLIN G POTASSIUM 5000000 UNITS IJ SOLR
5.0000 10*6.[IU] | Freq: Once | INTRAVENOUS | Status: AC
  Administered 2013-04-12: 5 10*6.[IU] via INTRAVENOUS
  Filled 2013-04-12: qty 5

## 2013-04-12 NOTE — Progress Notes (Signed)
Bleeding greatly diminished now.  Foley had been pulled back as much as possible when bleeding first noticed.  Placenta located on the right lateral posterior wall.  If bleeding picks up again, will deflate balloon by 30 cc, or remove it if necessary. FHR remains stable with + accels, no decels.

## 2013-04-12 NOTE — Progress Notes (Signed)
Pulse: 75 Baby is moving less than usual. Having some spotting.

## 2013-04-12 NOTE — Patient Instructions (Signed)
Labor Induction  Most women go into labor on their own between 37 and 42 weeks of the pregnancy. When this does not happen or when there is a medical need, medicine or other methods may be used to induce labor. Labor induction causes a pregnant woman's uterus to contract. It also causes the cervix to soften (ripen), open (dilate), and thin out (efface). Usually, labor is not induced before 39 weeks of the pregnancy unless there is a problem with the baby or mother. Whether your labor will be induced depends on a number of factors, including the following:  The medical condition of you and the baby.  How many weeks along you are.  The status of baby's lung maturity.  The condition of the cervix.  The position of the baby. REASONS FOR LABOR INDUCTION  The health of the baby or mother is at risk.  The pregnancy is overdue by 1 week or more.  The water breaks but labor does not start on its own.  The mother has a health condition or serious illness such as high blood pressure, infection, placental abruption, or diabetes.  The amniotic fluid amounts are low around the baby.  The baby is distressed. REASONS TO NOT INDUCE LABOR Labor induction may not be a good idea if:  It is shown that your baby does not tolerate labor.  An induction is just more convenient.  You want the baby to be born on a certain date, like a holiday.  You have had previous surgeries on your uterus, such as a myomectomy or the removal of fibroids.  Your placenta lies very low in the uterus and blocks the opening of the cervix (placenta previa).  Your baby is not in a head down position.  The umbilical cord drops down into the birth canal in front of the baby. This could cut off the baby's blood and oxygen supply.  You have had a previous cesarean delivery.  There areunusual circumstances, such as the baby being extremely premature. RISKS AND COMPLICATIONS Problems may occur in the process of induction  and plans may need to be modified as a situation unfolds. Some of the risks of induction include:  Change in fetal heart rate, such as too high, too low, or erratic.  Risk of fetal distress.  Risk of infection to mother and baby.  Increased chance of having a cesarean delivery.  The rare, but increased chance that the placenta will separate from the uterus (abruption).  Uterine rupture (very rare). When induction is needed for medical reasons, the benefits of induction may outweigh the risks. BEFORE THE PROCEDURE Your caregiver will check your cervix and the baby's position. This will help your caregiver decide if you are far enough along for an induction to work. PROCEDURE Several methods of labor induction may be used, such as:   Taking prostaglandin medicine to dilate and ripen the cervix. The medicine will also start contractions. It can be taken by mouth or by inserting a suppository into the vagina.  A thin tube (catheter) with a balloon on the end may be inserted into your vagina to dilate the cervix. Once inserted, the balloon expands with water, which causes the cervix to open.  Striping the membranes. Your caregiver inserts a finger between the cervix and membranes, which causes the cervix to be stretched and may cause the uterus to contract. This is often done during an office visit. You will be sent home to wait for the contractions to begin. You will   then come in for an induction.  Breaking the water. Your caregiver will make a hole in the amniotic sac using a small instrument. Once the amniotic sac breaks, contractions should begin. This may still take hours to see an effect.  Taking medicine to trigger or strengthen contractions. This medicine is given intravenously through a tube in your arm. All of the methods of induction, besides stripping the membranes, will be done in the hospital. Induction is done in the hospital so that you and the baby can be carefully  monitored. AFTER THE PROCEDURE Some inductions can take up to 2 or 3 days. Depending on the cervix, it usually takes less time. It takes longer when you are induced early in the pregnancy or if this is your first pregnancy. If a mother is still pregnant and the induction has been going on for 2 to 3 days, either the mother will be sent home or a cesarean delivery will be needed. Document Released: 02/02/2007 Document Revised: 12/06/2011 Document Reviewed: 07/19/2011 [employer] Patient Information 2014 [employer].  

## 2013-04-12 NOTE — Progress Notes (Signed)
IOL scheduled today 1930 per Dr. Shawnie Pons and K. Shaw NST reactive

## 2013-04-12 NOTE — H&P (Signed)
Carrie Duncan is a 26 y.o. female G50P1101 with IUP at [redacted]w[redacted]d presenting for IOL for GHTN. [facility] at [facility] since 12 wks  Prenatal History/Complications: Hx of IOL for "HTN" @ 26 weeks, baby died  Past Medical History: Past Medical History  Diagnosis Date  . Gestational HTN   . Pregnancy induced hypertension   . Chronic hypertension in pregnancy 03/29/2013    Start antenatal testing at 37 weeks.      Past Surgical History: Past Surgical History  Procedure Laterality Date  . No past surgeries      Obstetrical History: OB History   Grav Para Term Preterm Abortions TAB SAB Ect Mult Living   3 2 1 1  0 0    1       Social History: History   Social History  . Marital Status: Married    Spouse Name: N/A    Number of Children: N/A  . Years of Education: N/A   Social History Main Topics  . Smoking status: Never Smoker   . Smokeless tobacco: Never Used  . Alcohol Use: No  . Drug Use: No  . Sexually Active: Yes    Birth Control/ Protection: None   Other Topics Concern  . None   Social History Narrative  . None    Family History: Family History  Problem Relation Age of Onset  . Hypertension Father   . Alcohol abuse Neg Hx   . Arthritis Neg Hx   . Asthma Neg Hx   . Birth defects Neg Hx   . Cancer Neg Hx   . COPD Neg Hx   . Depression Neg Hx   . Diabetes Neg Hx   . Drug abuse Neg Hx   . Early death Neg Hx   . Hearing loss Neg Hx   . Heart disease Neg Hx   . Hyperlipidemia Neg Hx   . Kidney disease Neg Hx   . Learning disabilities Neg Hx   . Mental illness Neg Hx   . Mental retardation Neg Hx   . Miscarriages / Stillbirths Neg Hx   . Stroke Neg Hx   . Vision loss Neg Hx     Allergies: No Known Allergies  Prescriptions prior to admission  Medication Sig Dispense Refill  . Prenatal Vit-Fe Fumarate-FA (PRENATAL MULTIVITAMIN) TABS Take 1 tablet by mouth daily.  30 tablet  5  . [DISCONTINUED] Elastic Bandages & Supports (WRIST SPLINT) MISC 2 each by Does not  apply route at bedtime.  2 each  0  . [DISCONTINUED] famotidine (PEPCID) 20 MG tablet Take 1 tablet (20 mg total) by mouth 2 (two) times daily.  60 tablet  6  . [DISCONTINUED] traMADol (ULTRAM) 50 MG tablet Take 2 tablets (100 mg total) by mouth every 6 (six) hours as needed for pain.  40 tablet  0  . [DISCONTINUED] zolpidem (AMBIEN CR) 6.25 MG CR tablet Take 1 tablet (6.25 mg total) by mouth at bedtime as needed for sleep.  5 tablet  0     Review of Systems   Constitutional: Negative for fever and chills Eyes: Negative for visual disturbances Respiratory: Negative for shortness of breath, dyspnea Cardiovascular: Negative for chest pain or palpitations  Gastrointestinal: Negative for vomiting, diarrhea and constipation Genitourinary: Negative for dysuria and urgency Musculoskeletal: Negative for back pain, joint pain, myalgias  Neurological: Negative for dizziness and headaches .       Blood pressure 142/91, pulse 71, temperature 99.2 F (37.3 C), temperature source Oral, resp. rate  18, height 5' (1.524 m), weight 72.122 kg (159 lb), last menstrual period 07/11/2012. General appearance: alert, cooperative and no distress Lungs: clear to auscultation bilaterally Heart: regular rate and rhythm Abdomen: soft, non-tender; bowel sounds normal Extremities: Homans sign is negative, no sign of DVT DTR's 2+ Presentation: cephalic Fetal monitoringBaseline: 150 bpm, avg LTV, + accels, no decels Uterine activity:  Irregular, q 4-7 minutes  Dilation: 1 Effacement (%): 40 Station: -2 Exam by:: Drenda Freeze   Prenatal labs: ABO, Rh: O/POS/-- (01/16 0920) Antibody: NEG (01/16 0920) Rubella:  immune RPR: NON REAC (04/24 1034)  HBsAg: NEGATIVE (01/16 0920)  HIV: NON REACTIVE (04/24 1034)  GBS: Positive (06/16 0000)  1 hr Glucola 136-normal 3 hour Genetic screening  Normal quad screen Anatomy US normal  Results for orders placed during the hospital encounter of 04/12/13 (from the past 24  hour(s))  CBC     Status: None   Collection Time    04/12/13  8:00 PM      Result Value Range   WBC 8.5  4.0 - 10.5 K/uL   RBC 4.46  3.87 - 5.11 MIL/uL   Hemoglobin 13.3  12.0 - 15.0 g/dL   HCT 29.5  28.4 - 13.2 %   MCV 84.5  78.0 - 100.0 fL   MCH 29.8  26.0 - 34.0 pg   MCHC 35.3  30.0 - 36.0 g/dL   RDW 44.0  10.2 - 72.5 %   Platelets 284  150 - 400 K/uL   Pr/cr ratio:  0.11 on 04/11/13  Assessment: Carrie Duncan is a 26 y.o. G3P1101 with an IUP at [redacted]w[redacted]d presenting for IOL for GHTN  Plan: Cook Foley inserted and inflated with 80cc H20 in uterine balloon only.  Will start Pitocin when balloon falls out GBS prophylaxis   CRESENZO-DISHMAN,[place] Kyler 04/12/2013, 9:28 PM

## 2013-04-12 NOTE — Progress Notes (Signed)
Pt was seen @ [facility] yesterday for r/o labor.  Pt felt good FM during NST but states this is less than normal.  Pt advised of normal pattern of FM during late 3rd trimester.  Pt is scheduled for IOL today @ 1930.   Interpreter present during visit.

## 2013-04-12 NOTE — MAU Provider Note (Signed)
Chart reviewed and agree with management and plan.  

## 2013-04-12 NOTE — Progress Notes (Addendum)
Pt began bleeding after foley bulb insertion into uterus for induction.  Drenda Freeze CNM called to assess the situation.  No new orders given.  Continue monitoring pt and fetal heart tones.  New pad and pt clean to able to assess the amount of blood that is new.  Will continue to monitor pt's bleeding.    I agree with above assessment

## 2013-04-13 ENCOUNTER — Encounter (HOSPITAL_COMMUNITY): Payer: Self-pay

## 2013-04-13 DIAGNOSIS — O139 Gestational [pregnancy-induced] hypertension without significant proteinuria, unspecified trimester: Secondary | ICD-10-CM

## 2013-04-13 DIAGNOSIS — D649 Anemia, unspecified: Secondary | ICD-10-CM

## 2013-04-13 MED ORDER — ZOLPIDEM TARTRATE 5 MG PO TABS: 5.0000 mg | ORAL_TABLET | Freq: Every evening | ORAL | Status: DC | PRN

## 2013-04-13 MED ORDER — ONDANSETRON HCL 4 MG PO TABS: 4.0000 mg | ORAL_TABLET | ORAL | Status: DC | PRN

## 2013-04-13 MED ORDER — LANOLIN HYDROUS EX OINT: TOPICAL_OINTMENT | CUTANEOUS | Status: DC | PRN

## 2013-04-13 MED ORDER — LABETALOL HCL 5 MG/ML IV SOLN
10.0000 mg | INTRAVENOUS | Status: DC | PRN
  Filled 2013-04-13: qty 4

## 2013-04-13 MED ORDER — MISOPROSTOL 200 MCG PO TABS
800.0000 ug | ORAL_TABLET | Freq: Once | ORAL | Status: AC
  Administered 2013-04-13: 800 ug via RECTAL
  Filled 2013-04-13: qty 4

## 2013-04-13 MED ORDER — SIMETHICONE 80 MG PO CHEW: 80.0000 mg | CHEWABLE_TABLET | ORAL | Status: DC | PRN

## 2013-04-13 MED ORDER — SENNOSIDES-DOCUSATE SODIUM 8.6-50 MG PO TABS
2.0000 | ORAL_TABLET | Freq: Every day | ORAL | Status: DC
  Administered 2013-04-14: 2 via ORAL

## 2013-04-13 MED ORDER — ONDANSETRON HCL 4 MG/2ML IJ SOLN: 4.0000 mg | INTRAMUSCULAR | Status: DC | PRN

## 2013-04-13 MED ORDER — BENZOCAINE-MENTHOL 20-0.5 % EX AERO: 1.0000 "application " | INHALATION_SPRAY | CUTANEOUS | Status: DC | PRN

## 2013-04-13 MED ORDER — PRENATAL MULTIVITAMIN CH
1.0000 | ORAL_TABLET | Freq: Every day | ORAL | Status: DC
  Administered 2013-04-14 – 2013-04-15 (×2): 1 via ORAL
  Filled 2013-04-13 (×2): qty 1

## 2013-04-13 MED ORDER — DIBUCAINE 1 % RE OINT: 1.0000 "application " | TOPICAL_OINTMENT | RECTAL | Status: DC | PRN

## 2013-04-13 MED ORDER — WITCH HAZEL-GLYCERIN EX PADS: 1.0000 "application " | MEDICATED_PAD | CUTANEOUS | Status: DC | PRN

## 2013-04-13 MED ORDER — OXYCODONE-ACETAMINOPHEN 5-325 MG PO TABS
1.0000 | ORAL_TABLET | ORAL | Status: DC | PRN
  Administered 2013-04-13: 2 via ORAL
  Administered 2013-04-14: 1 via ORAL
  Administered 2013-04-14: 2 via ORAL
  Filled 2013-04-13: qty 1
  Filled 2013-04-13 (×2): qty 2

## 2013-04-13 MED ORDER — IBUPROFEN 600 MG PO TABS
600.0000 mg | ORAL_TABLET | Freq: Four times a day (QID) | ORAL | Status: DC
  Administered 2013-04-14 – 2013-04-15 (×6): 600 mg via ORAL
  Filled 2013-04-13 (×6): qty 1

## 2013-04-13 MED ORDER — TETANUS-DIPHTH-ACELL PERTUSSIS 5-2.5-18.5 LF-MCG/0.5 IM SUSP: 0.5000 mL | Freq: Once | INTRAMUSCULAR | Status: DC

## 2013-04-13 MED ORDER — DIPHENHYDRAMINE HCL 25 MG PO CAPS: 25.0000 mg | ORAL_CAPSULE | Freq: Four times a day (QID) | ORAL | Status: DC | PRN

## 2013-04-13 NOTE — Progress Notes (Signed)
   Carrie Duncan is a 27 y.o. G3P1101 at [redacted]w[redacted]d  admitted for induction of labor due to [facility]. Foley Out  Subjective: Pt laboring on hands and knees. Moderate pain control with fentanyl. Defer to pt preference.  Objective: BP 149/97  Pulse 68  Temp(Src) 99.2 F (37.3 C) (Oral)  Resp 18  Ht 5' (1.524 m)  Wt 72.122 kg (159 lb)  BMI 31.05 kg/m2  LMP 07/11/2012    FHT:  140s, mod var, +accels 15x15, no decels UC:   irregular, every 2-5 minutes SVE:   Dilation: 5 Effacement (%): 40 Station: -2 Exam by:: fran, cnm  Labs: Lab Results  Component Value Date   WBC 8.5 04/12/2013   HGB 13.3 04/12/2013   HCT 37.7 04/12/2013   MCV 84.5 04/12/2013   PLT 284 04/12/2013    Assessment / Plan: Foley out, s/p foley bulb Will start pitocin  Labor: Progressing normally Fetal Wellbeing:  Category I, reactive and reassuring Pain Control:  Fentanyl PRN Anticipated MOD:  NSVD  Dontea Corlew, RYAN 04/13/2013, 7:44 AM

## 2013-04-13 NOTE — Progress Notes (Signed)
   Carrie Duncan is a 27 y.o. G3P1101 at [redacted]w[redacted]d  admitted for induction of labor due to [facility].  Subjective: Resting comfortably with IV fentanyl  Objective: BP 149/97  Pulse 68  Temp(Src) 99.2 F (37.3 C) (Oral)  Resp 18  Ht 5' (1.524 m)  Wt 72.122 kg (159 lb)  BMI 31.05 kg/m2  LMP 07/11/2012    FHT:  FHR: 130 bpm, variability: moderate,  accelerations:  Present,  decelerations:  Absent UC:   irregular, every 2-5 minutes SVE:   Dilation: 1 Effacement (%): 40 Station: -2 Exam by:: AL Rinehart RN Foley still in, palpable through cx per RN Labs: Lab Results  Component Value Date   WBC 8.5 04/12/2013   HGB 13.3 04/12/2013   HCT 37.7 04/12/2013   MCV 84.5 04/12/2013   PLT 284 04/12/2013    Assessment / Plan: IOL for GHTN, ripening phase  Will start pitocin when foley falls out  Labor: Progressing normally Fetal Wellbeing:  Category I Pain Control:  Fentanyl Anticipated MOD:  NSVD  CRESENZO-DISHMAN,[name] 04/13/2013, 4:22 AM

## 2013-04-13 NOTE — Progress Notes (Signed)
   Carrie Duncan is a 27 y.o. G3P1101 at [redacted]w[redacted]d  admitted for induction of labor due to [facility]. Foley Out  Subjective: Pt laboring on hands and knees.no longer using pain meds. Defer to pt preference.  Objective: BP 142/94  Pulse 87  Temp(Src) 98.6 F (37 C) (Oral)  Resp 20  Ht 5' (1.524 m)  Wt 72.122 kg (159 lb)  BMI 31.05 kg/m2  LMP 07/11/2012    FHT:  135, mod var, +accels 15x15, occassional early and variable decels UC:   regular, every 2-5 minutes SVE:   Dilation: 7 Effacement (%): 50 Station: -3 Exam by:: Champayne Kocian  Labs: Lab Results  Component Value Date   WBC 8.5 04/12/2013   HGB 13.3 04/12/2013   HCT 37.7 04/12/2013   MCV 84.5 04/12/2013   PLT 284 04/12/2013    Assessment / Plan: Foley out, s/p foley bulb. Pit @ 14U  Labor: Progressing normally, if no change will perform amniotomy Fetal Wellbeing:  Category II, reactive and reassuring Pain Control:  Fentanyl PRN Anticipated MOD:  NSVD ID:  GBS+ rec'd PCN x2 adequate continue as Rx  Aleja Yearwood, RYAN 04/13/2013, 2:54 PM

## 2013-04-13 NOTE — Progress Notes (Signed)
   Carrie Duncan is a 27 y.o. G3P1101 at [redacted]w[redacted]d  admitted for induction of labor due to [facility]. Foley Out  Subjective: Pt laboring in bed. SROM clear. Defer pain control to pt preference.  Objective: BP 145/90  Pulse 85  Temp(Src) 98.5 F (36.9 C) (Oral)  Resp 18  Ht 5' (1.524 m)  Wt 72.122 kg (159 lb)  BMI 31.05 kg/m2  LMP 07/11/2012    FHT:  120, mod var, +accels 15x15, occassional early decels UC:   regular, every 2-5 minutes SVE:   Dilation: 9 Effacement (%): 100 Station: 0 Exam by:: Whittney Steenson  Labs: Lab Results  Component Value Date   WBC 8.5 04/12/2013   HGB 13.3 04/12/2013   HCT 37.7 04/12/2013   MCV 84.5 04/12/2013   PLT 284 04/12/2013    Assessment / Plan: Foley out, s/p foley bulb. Pit @ 14U  Labor: Progressing normally , making change,s/p SROM ~1730 Fetal Well being:  Category II, reactive and reassuring Pain Control:  Fentanyl PRN Anticipated MOD:  NSVD ID:  GBS+ rec'd PCN x2 adequate continue as Rx  Carrie Duncan, RYAN 04/13/2013, 5:42 PM

## 2013-04-13 NOTE — Progress Notes (Signed)
Patient ID: Carrie Duncan, female   DOB: 05-20-1986, 27 y.o.   MRN: [id number]  I was called for patient continuing to bleed, passing clots, feeling dizzy  Subjective: Patient feels lightheaded, lower abdominal pain, bleeding.  Objective: Filed Vitals:   04/13/13 2200  BP: 136/90  Pulse: 134  Temp: 99.1 F (37.3 C)  Resp: 18   General appearance: cooperative, fatigued and mild distress HEENT: NCAT, diaphoretic Lungs: clear to auscultation bilaterally Heart: Tachycardic, S1, S2 normal Abdomen: tender to palpation over uterus, uterus feels firm Extremities: no edema, redness or tenderness in the calves or thighs Skin: warm, diaphoretic  A/P: 27 y.o. [id number] delivered SVD at [redacted]w[redacted]d around 1900 tonight  Blood pressures have been running in 140s/90s and tachycardic in 110s-130s since delivery Continuing to bleed, give 800mg  cytotec rectally and reassess  Tawni Carnes MD 04/13/13 22:30

## 2013-04-13 NOTE — Consult Note (Signed)
Neonatology Note:  Attendance at Code Apgar:  Our team responded to a Code Apgar call to room # 165 following NSVD, due to infant with apnea. The requesting physician was Dr. Eure. The mother is a G3P2L1 O pos, GBS positive with gestational HTN, being induced. ROM occurred 1 1/2 hours PTD and the fluid was clear. The mother had received Pen G for > 4 hours prior to delivery and was afebrile during labor. She had gotten a dose of Fentanyl about 1 hour prior to delivery. There was a prolonged FHR bradycardia for the 10-12 minutes prior to delivery. At delivery, the baby floppy and apneic. The OB nursing staff in attendance gave vigorous stimulation and a Code Apgar was called. Our team arrived at 1 minute of life, at which time the baby was being given PPV by Dr. Eure. HR was > 100 and the baby was starting to have movement and grimace. PPV was stopped and vigorous stimulation was given, as well as bulb suctioning. The baby started crying at about 1.5 minutes and had normal tone by 2 minutes. He had O2 saturations of 96% in room air at 3-4 minutes of life. Ap 3/9. I spoke with the parents in the DR, then transferred the baby to the Pediatrician's care.  Laith Antonelli C. Marria Mathison, MD  

## 2013-04-14 LAB — CBC
Hemoglobin: 7.8 g/dL — ABNORMAL LOW (ref 12.0–15.0)
MCH: 30 pg (ref 26.0–34.0)
MCV: 84.2 fL (ref 78.0–100.0)
Platelets: 206 10*3/uL (ref 150–400)
RBC: 2.6 MIL/uL — ABNORMAL LOW (ref 3.87–5.11)

## 2013-04-14 MED ORDER — LACTATED RINGERS IV BOLUS (SEPSIS)
500.0000 mL | Freq: Once | INTRAVENOUS | Status: AC
  Administered 2013-04-14: 500 mL via INTRAVENOUS

## 2013-04-14 NOTE — Progress Notes (Signed)
Patient ID: Carrie Duncan, female   DOB: April 17, 1986, 27 y.o.   MRN: [id number]  Subjective: Patient feeling a little better. Still has some dizziness. Passed large clot recently while going to the bathroom.  Objective: Filed Vitals:   04/14/13 0111  BP: 138/90  Pulse: 109  Temp: 98.8 F (37.1 C)  Resp: 18   General appearance: cooperative, fatigued and no distress  HEENT: NCAT Lungs: clear to auscultation bilaterally  Heart: Tachycardic, S1, S2 normal  Abdomen: tender to palpation over uterus, uterus feels firm  Extremities: no edema, redness or tenderness in the calves or thighs  Skin: warm, dry  A/P: Large clot likely the source of continued bleeding. Uterus firm. Will give LR bolus now Will order CBC for the morning  Asked nurse to call in an hour if bleeding and passing clots continues  Tawni Carnes MD 04/14/2013 01:30

## 2013-04-14 NOTE — Progress Notes (Signed)
Post Partum Day #1 Subjective: no complaints, up ad lib and tolerating PO; denies dizziness or s/s anemia; had some increased bldg during the night that improved with cytotec; during the first two hours PP had some elevated BPs with DBP in the 90s  Objective: Blood pressure 128/70, pulse 96, temperature 98.4 F (36.9 C), temperature source Axillary, resp. rate 20, height 5' (1.524 m), weight 72.122 kg (159 lb), last menstrual period 07/11/2012, unknown if currently breastfeeding.  Physical Exam:  General: alert, cooperative and no distress Lungs: nl effort Heart: RRR Lochia: appropriate Uterine Fundus: firm DVT Evaluation: No evidence of DVT seen on physical exam.   Recent Labs  04/12/13 2000 04/14/13 0625  HGB 13.3 7.8*  HCT 37.7 21.9*    Assessment/Plan:  PP anemia- without symptoms currently Plan for discharge tomorrow Follow BPs    LOS: 2 days   Cam Hai 04/14/2013, 7:33 AM

## 2013-04-15 MED ORDER — IBUPROFEN 600 MG PO TABS: 600.0000 mg | ORAL_TABLET | Freq: Four times a day (QID) | ORAL | Status: DC | PRN

## 2013-04-15 MED ORDER — DOCUSATE SODIUM 100 MG PO CAPS: 100.0000 mg | ORAL_CAPSULE | Freq: Two times a day (BID) | ORAL | Status: DC | PRN

## 2013-04-15 MED ORDER — FERROUS SULFATE 325 (65 FE) MG PO TABS: 325.0000 mg | ORAL_TABLET | Freq: Two times a day (BID) | ORAL | Status: DC

## 2013-04-15 NOTE — Discharge Summary (Signed)
Obstetric Discharge Summary Reason for Admission: IUP at [redacted]w[redacted]d presenting for IOL for [name] Prenatal Procedures: NST Intrapartum Procedures: spontaneous vaginal delivery Postpartum Procedures: none Complications-Operative and Postpartum: none Hemoglobin  Date Value Range Status  04/14/2013 7.8* 12.0 - 15.0 g/dL Final     REPEATED TO VERIFY     DELTA CHECK NOTED     HCT  Date Value Range Status  04/14/2013 21.9* 36.0 - 46.0 % Final   Normal vaginal delivery. No progression to preeclampsia.  BP stable after delivery. Breastfeeeding, desires Depo Provera for contraception.  Physical Exam:  General: alert and no distress Lochia: appropriate Uterine Fundus: firm DVT Evaluation: No evidence of DVT seen on physical exam. Negative Homan's sign.  Discharge Diagnoses: Term Pregnancy-delivered  Discharge Information: Date: 04/15/2013 Activity: pelvic rest x 6 weeks Diet: routine Medications: Ibuprofen, Colace and Iron Condition: stable Instructions: refer to discharge instructions Discharge to: home Follow-up Information   Follow up with [facility] In 6 weeks. (You will be called with an appointment date and time)    Contact information:   [address] [place] [address] [phone]       Newborn Data: Live born female  Birth Weight: 6 lb 9.1 oz (2980 g) APGAR: 3, 9  Home with mother.  Carrie Duncan A 04/15/2013, 9:33 AM

## 2013-04-16 NOTE — Progress Notes (Signed)
Post discharge chart review completed.  

## 2013-04-16 NOTE — H&P (Signed)
Attestation of Attending Supervision of Advanced Practitioner (CNM/NP): Evaluation and management procedures were performed by the Advanced Practitioner under my supervision and collaboration. I have reviewed the Advanced Practitioner's note and chart, and I agree with the management and plan.  Carrie Carmon H. 3:38 PM   

## 2013-04-17 ENCOUNTER — Encounter: Payer: Self-pay | Admitting: Obstetrics & Gynecology

## 2013-05-05 ENCOUNTER — Emergency Department (HOSPITAL_COMMUNITY)
Admission: EM | Admit: 2013-05-05 | Discharge: 2013-05-05 | Disposition: A | Discharge status: Home / Self Care | Payer: Medicaid Other | Source: Home / Self Care | Attending: Family Medicine | Admitting: Family Medicine

## 2013-05-05 ENCOUNTER — Encounter (HOSPITAL_COMMUNITY): Payer: Self-pay | Admitting: *Deleted

## 2013-05-05 DIAGNOSIS — R5383 Other fatigue: Secondary | ICD-10-CM

## 2013-05-05 DIAGNOSIS — R5381 Other malaise: Secondary | ICD-10-CM

## 2013-05-05 DIAGNOSIS — K219 Gastro-esophageal reflux disease without esophagitis: Secondary | ICD-10-CM

## 2013-05-05 DIAGNOSIS — O139 Gestational [pregnancy-induced] hypertension without significant proteinuria, unspecified trimester: Secondary | ICD-10-CM

## 2013-05-05 MED ORDER — FAMOTIDINE 20 MG PO TABS: 20.0000 mg | ORAL_TABLET | Freq: Two times a day (BID) | ORAL | Status: DC

## 2013-05-05 NOTE — ED Notes (Signed)
Pt  Reports   Symptoms  Of  Epigastric  Pain  Radiating to  Back     Since  Last       Pm           3  Weeks  Post  Partem   Breast  Feeding       Denys  Any  Vaginal bleeding            Also   Reports  dizzyness  As  Well

## 2013-05-05 NOTE — ED Provider Notes (Signed)
CSN: [id number]     Arrival date & time 05/05/13  1305 History     First MD Initiated Contact with Patient 05/05/13 1333     Chief Complaint  Patient presents with  . Abdominal Pain   (Consider location/radiation/quality/duration/timing/severity/associated sxs/prior Treatment) Patient is a 27 y.o. female presenting with abdominal pain.  Abdominal Pain Associated symptoms include abdominal pain. Pertinent negatives include no chest pain, no headaches and no shortness of breath.    Epigastric pain: started last night around 10pm. Difficult to sleep because of pain. Radiation to back. Denies n/v/d/c, fevers. Tolerating PO. Breast feeding. BM last night. Pt is 3 wks postpartum SVD. Took famotidine last night w/ relief. No longer takin iron or motrin prescribed at time of delivery.   Past Medical History  Diagnosis Date  . Gestational HTN   . Pregnancy induced hypertension   . Chronic hypertension in pregnancy 03/29/2013    Start antenatal testing at 37 weeks.     Past Surgical History  Procedure Laterality Date  . No past surgeries     Family History  Problem Relation Age of Onset  . Hypertension Father   . Alcohol abuse Neg Hx   . Arthritis Neg Hx   . Asthma Neg Hx   . Birth defects Neg Hx   . Cancer Neg Hx   . COPD Neg Hx   . Depression Neg Hx   . Diabetes Neg Hx   . Drug abuse Neg Hx   . Early death Neg Hx   . Hearing loss Neg Hx   . Heart disease Neg Hx   . Hyperlipidemia Neg Hx   . Kidney disease Neg Hx   . Learning disabilities Neg Hx   . Mental illness Neg Hx   . Mental retardation Neg Hx   . Miscarriages / Stillbirths Neg Hx   . Stroke Neg Hx   . Vision loss Neg Hx    History  Substance Use Topics  . Smoking status: Never Smoker   . Smokeless tobacco: Never Used  . Alcohol Use: No   OB History   Grav Para Term Preterm Abortions TAB SAB Ect Mult Living   3 3 2 1  0 0    2     Review of Systems  Constitutional: Negative for fever and activity change.   Respiratory: Negative for shortness of breath.   Cardiovascular: Negative for chest pain.  Gastrointestinal: Positive for abdominal pain.  Neurological: Negative for headaches.    Allergies  Review of patient's allergies indicates no known allergies.  Home Medications   Current Outpatient Rx  Name  Route  Sig  Dispense  Refill  . famotidine (PEPCID) 20 MG tablet   Oral   Take 20 mg by mouth 2 (two) times daily.         Marland Kitchen docusate sodium (COLACE) 100 MG capsule   Oral   Take 1 capsule (100 mg total) by mouth 2 (two) times daily as needed for constipation.   30 capsule   2   . Prenatal Vit-Fe Fumarate-FA (PRENATAL MULTIVITAMIN) TABS   Oral   Take 1 tablet by mouth daily.   30 tablet   5    BP 129/85  Pulse 90  Temp(Src) 99.5 F (37.5 C) (Oral)  Resp 20  SpO2 100% Physical Exam  Constitutional: She is oriented to person, place, and time. She appears well-developed and well-nourished. No distress.  Tired appearing  HENT:  Head: Normocephalic and atraumatic.  Eyes: EOM are  normal. Pupils are equal, round, and reactive to light.  Neck: Normal range of motion. Neck supple.  Cardiovascular: Normal rate, normal heart sounds and intact distal pulses.   Pulmonary/Chest: Effort normal and breath sounds normal. No respiratory distress.  Abdominal: Soft. She exhibits no distension. There is tenderness (mild epigastric pain on palpation).  Musculoskeletal: Normal range of motion. She exhibits no edema.  Neurological: She is alert and oriented to person, place, and time.  Skin: Skin is warm and dry. No rash noted. She is not diaphoretic. No erythema.  Psychiatric: She has a normal mood and affect. Her behavior is normal. Thought content normal.    ED Course   Procedures (including critical care time)  Labs Reviewed - No data to display No results found. No diagnosis found.  MDM  26yo AAF 3 wks postpartum w/ epigastric pain consistent w/ GERD. Only taking famotidine  PRN. BP appropriate at this time. Families concerns about BP addressed.  - take Famotidine as prescribed for at least 2 wks (20mg  BID) - food journal to track triggers - [facility]. Pt to check BP at home - precautions given and all questions answered  Shelly Flatten, MD Family Medicine PGY-3 05/05/2013, 1:50 PM  Reviewed previous labs, vs and notes from pts chart  Ozella Rocks, MD 05/05/13 1354

## 2013-05-06 NOTE — ED Provider Notes (Signed)
Medical screening examination/treatment/procedure(s) were performed by 3rd year family medicine resident and as supervising physician I was immediately available for consultation/collaboration.   [facility]; MD  Sharin Grave, MD 05/06/13 1006

## 2013-05-10 ENCOUNTER — Ambulatory Visit (INDEPENDENT_AMBULATORY_CARE_PROVIDER_SITE_OTHER): Payer: Medicaid Other | Admitting: Obstetrics & Gynecology

## 2013-05-10 DIAGNOSIS — Z30013 Encounter for initial prescription of injectable contraceptive: Secondary | ICD-10-CM

## 2013-05-10 MED ORDER — BUTALBITAL-APAP-CAFFEINE 50-500-40 MG PO TABS: 1.0000 | ORAL_TABLET | ORAL | Status: DC | PRN

## 2013-05-10 MED ORDER — MEDROXYPROGESTERONE ACETATE 150 MG/ML IM SUSP: 150.0000 mg | INTRAMUSCULAR | Status: DC

## 2013-05-10 NOTE — Patient Instructions (Signed)

## 2013-05-10 NOTE — Progress Notes (Unsigned)
Patient ID: Carrie Duncan, female   DOB: [birth year], 27 y.o.   MRN: [id number] Subjective:     Carrie Duncan is a 27 y.o. female who presents for a postpartum visit. She is 4 weeks postpartum following a spontaneous vaginal delivery. I have fully reviewed the prenatal and intrapartum course. The delivery was at 39  gestational weeks. Outcome: spontaneous vaginal delivery. Anesthesia: epidural. Postpartum course has been good. Baby's course has been good. Baby is feeding by breast. Bleeding no bleeding. Bowel function is normal. Bladder function is normal. Patient is not sexually active. Contraception method is Depo-Provera injections. Postpartum depression screening: negative.  The following portions of the patient's history were reviewed and updated as appropriate: allergies, current medications, past family history, past medical history, past social history, past surgical history and problem list.  Review of Systems Pertinent items are noted in HPI.  Frequent headache Objective:    BP 128/86  Pulse 68  Temp(Src) 98.6 F (37 C) (Oral)  Ht 5' 0.25" (1.53 m)  Wt 144 lb (65.318 kg)  BMI 27.9 kg/m2  Breastfeeding? Yes  General:  alert, cooperative and no distress   Breasts:     Lungs:    Heart:     Abdomen: soft, non-tender; bowel sounds normal; no masses,  no organomegaly   Vulva:  not evaluated  Vagina: not evaluated     Corpus: not examined  Adnexa:  not evaluated  Rectal Exam: Not performed.        Assessment:     normal postpartum exam. Pap smear not done at today's visit.   Plan:    1. Contraception: Depo-Provera injections 2. Esgic plus for TT headache 3. Follow up in: 8 weeks or as needed.   Adam Phenix, MD 05/10/2013

## 2014-02-26 IMAGING — CT CT ABD-PELV W/ CM
2 of 4 series · 17 of 46 positions shown, 19 images · IV contrast (water/omni  & 100ml omni 300)
Comparison: None.

CLINICAL DATA: Lower abdominal pain

CT ABDOMEN AND PELVIS WITH CONTRAST
TECHNIQUE: Multidetector CT imaging of the abdomen and pelvis was
performed following the standard protocol during bolus
administration of intravenous contrast.
Contrast: 100mL OMNIPAQUE IOHEXOL 300 MG/ML  SOLN

[Series 2: routine abdomen · axial · 0.75mm/px · z∈[-459,-64]mm · 14 of 87 slices shown, 16 images]
[im 4/87  soft-tissue]
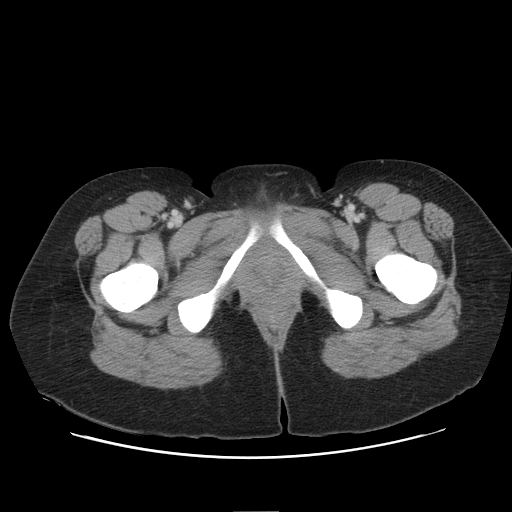
[im 4/87  bone]
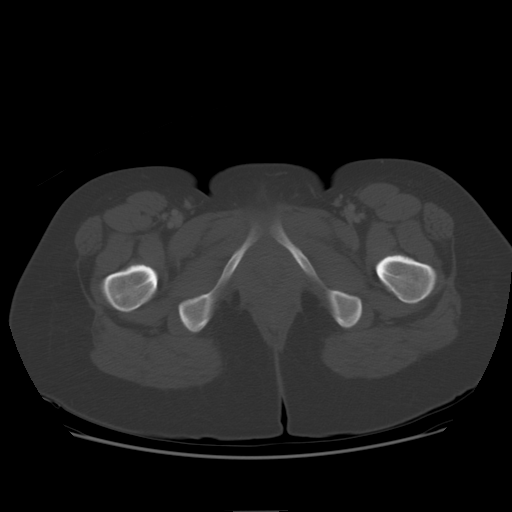
[im 11/87  soft-tissue]
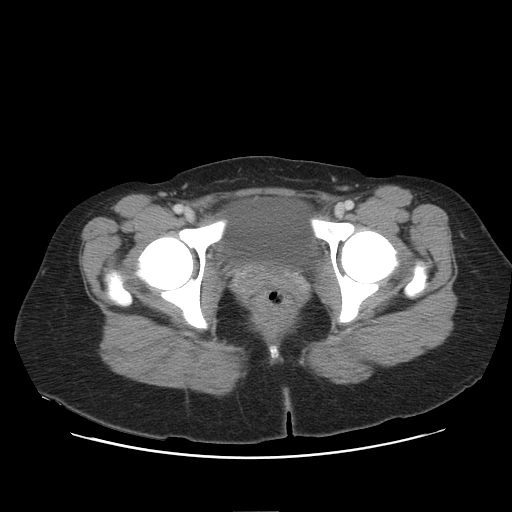
[im 18/87  soft-tissue]
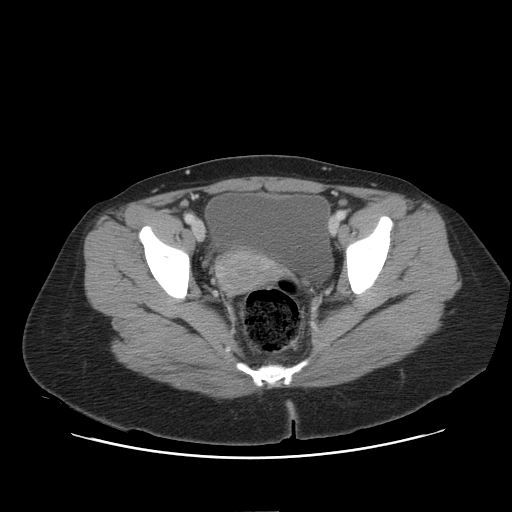
[im 22/87  soft-tissue]
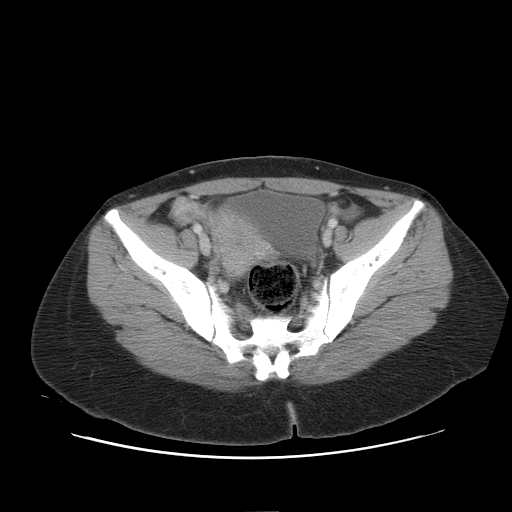
[im 29/87  soft-tissue]
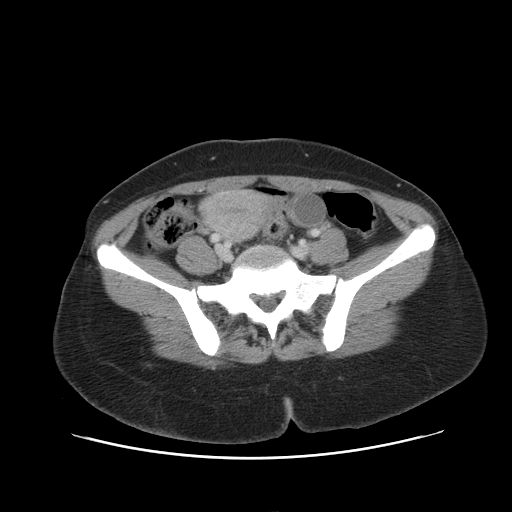
[im 36/87  soft-tissue]
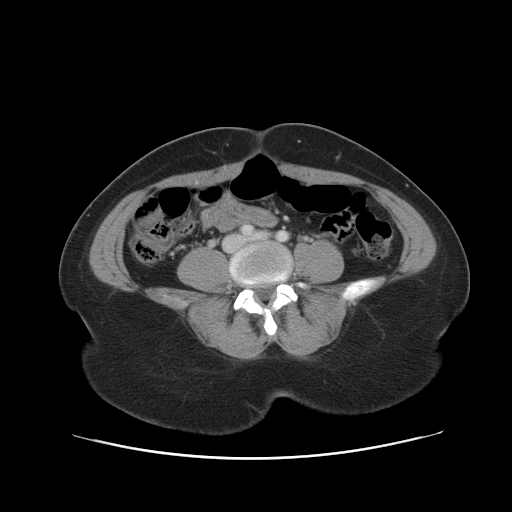
[im 40/87  soft-tissue]
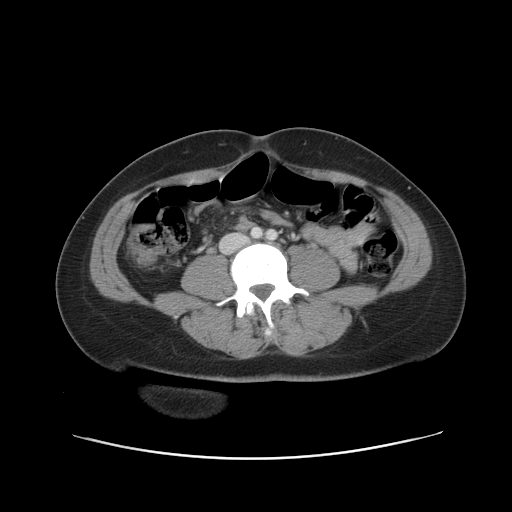
[im 47/87  soft-tissue]
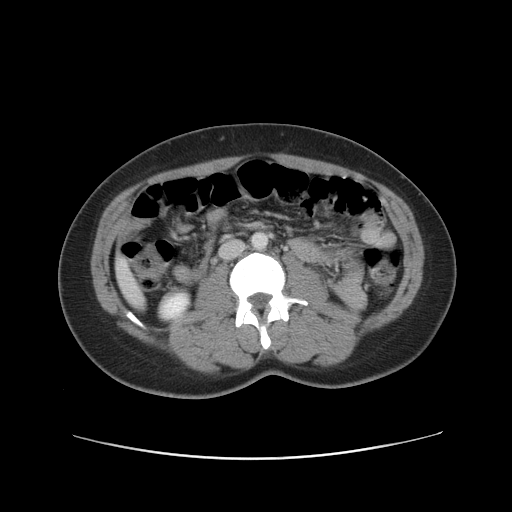
[im 51/87  soft-tissue]
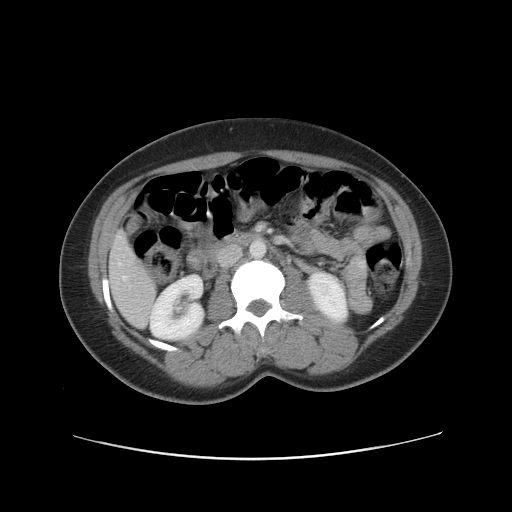
[im 51/87  bone]
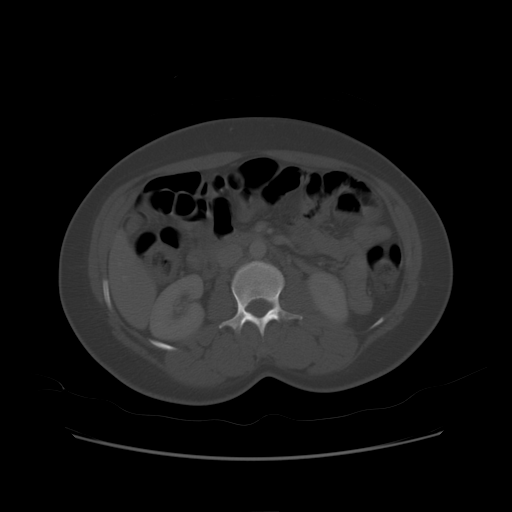
[im 58/87  soft-tissue]
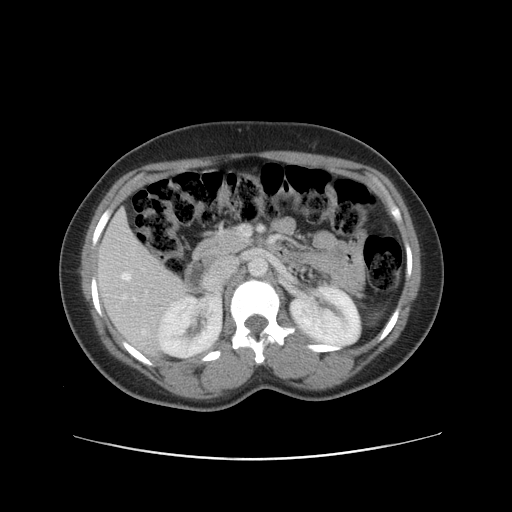
[im 65/87  soft-tissue]
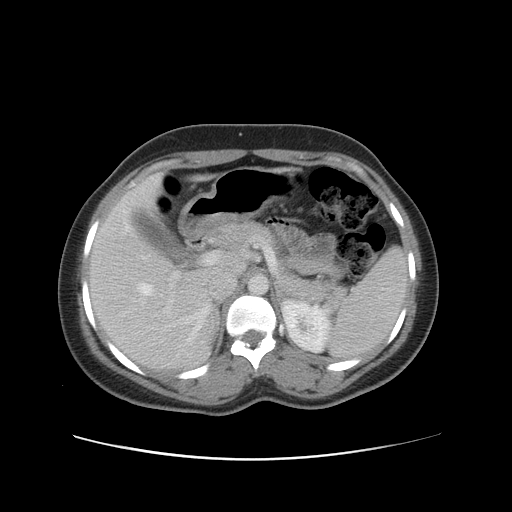
[im 69/87  soft-tissue]
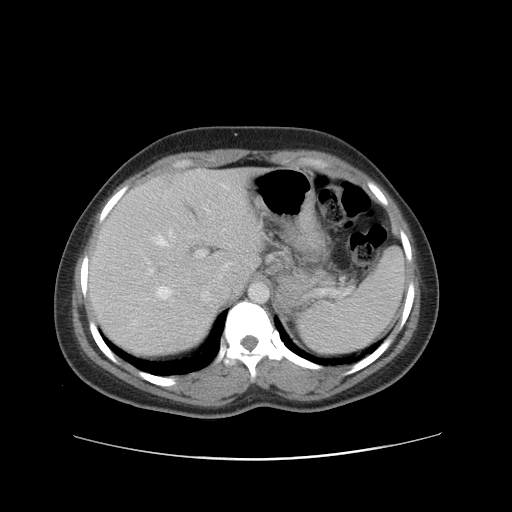
[im 76/87  soft-tissue]
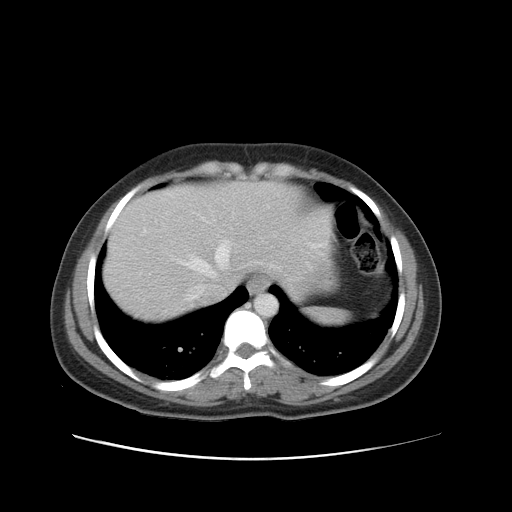
[im 83/87  soft-tissue]
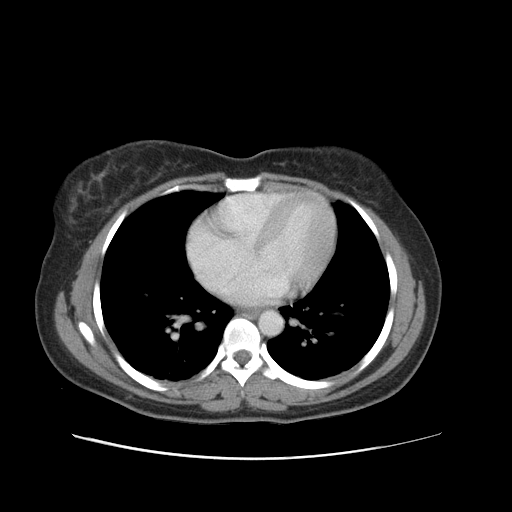

[Series 401: cor · coronal · 0.90mm/px · 3 of 83 slices shown]
[im 28/83  soft-tissue]
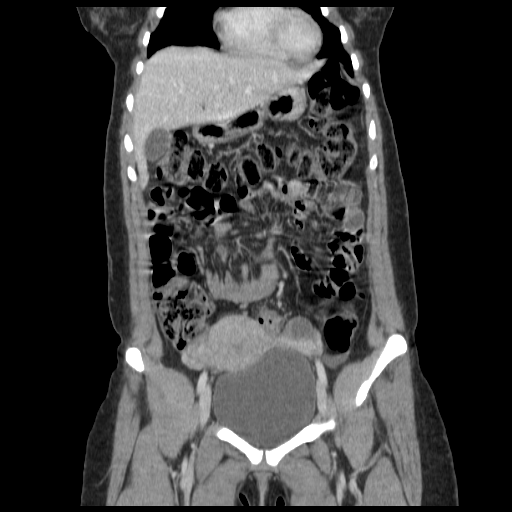
[im 37/83  soft-tissue]
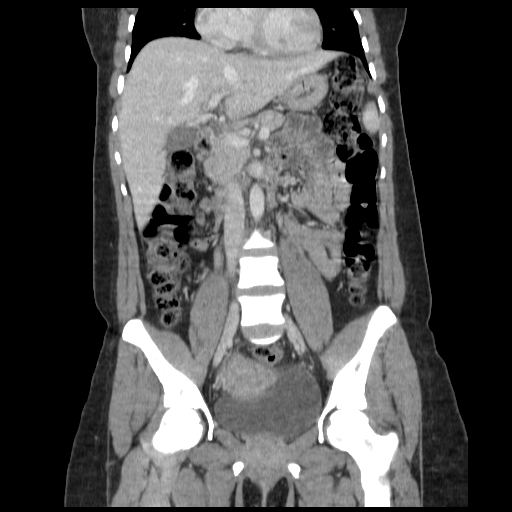
[im 46/83  soft-tissue]
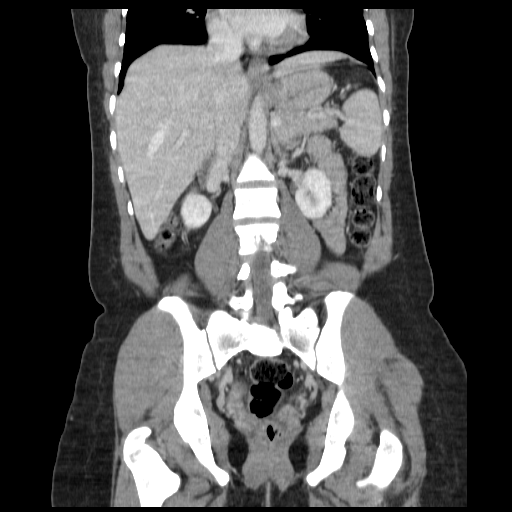

[17 of 46 positions shown; findings below may reference images not displayed]

FINDINGS: Limited images through the lung bases demonstrate no
significant appreciable abnormality. The heart size is within
normal limits. No pleural or pericardial effusion.  Calcified
granuloma right middle lobe.

Hypoattenuation adjacent to the falciform ligament is likely focal
fat are very perfusion.  Unremarkable biliary system, spleen,
pancreas, adrenal glands.

Symmetric renal enhancement.  No hydronephrosis or hydroureter.

No bowel obstruction.  No CT evidence for colitis.  Appendix within
normal limits.

Thin-walled bladder.  Nonspecific appearance to the uterus and
adnexa with physiologic cyst on the left.  There may be a corpus
luteal cyst on the right.  No free intraperitoneal air or fluid.

Normal caliber vasculature.  No acute osseous abnormality.
IMPRESSION: No acute abnormality identified by CT.

## 2014-05-02 ENCOUNTER — Encounter (HOSPITAL_COMMUNITY): Payer: Self-pay | Admitting: Emergency Medicine

## 2014-05-02 ENCOUNTER — Emergency Department (HOSPITAL_COMMUNITY): Payer: Self-pay

## 2014-05-02 ENCOUNTER — Emergency Department (HOSPITAL_COMMUNITY)
Admission: EM | Admit: 2014-05-02 | Discharge: 2014-05-03 | Disposition: A | Discharge status: Home / Self Care | Payer: Self-pay | Attending: Emergency Medicine | Admitting: Emergency Medicine

## 2014-05-02 DIAGNOSIS — W230XXA Caught, crushed, jammed, or pinched between moving objects, initial encounter: Secondary | ICD-10-CM | POA: Insufficient documentation

## 2014-05-02 DIAGNOSIS — Y929 Unspecified place or not applicable: Secondary | ICD-10-CM | POA: Insufficient documentation

## 2014-05-02 DIAGNOSIS — S6991XA Unspecified injury of right wrist, hand and finger(s), initial encounter: Secondary | ICD-10-CM

## 2014-05-02 DIAGNOSIS — Y9389 Activity, other specified: Secondary | ICD-10-CM | POA: Insufficient documentation

## 2014-05-02 DIAGNOSIS — R55 Syncope and collapse: Secondary | ICD-10-CM | POA: Insufficient documentation

## 2014-05-02 DIAGNOSIS — S6980XA Other specified injuries of unspecified wrist, hand and finger(s), initial encounter: Secondary | ICD-10-CM | POA: Insufficient documentation

## 2014-05-02 DIAGNOSIS — S6990XA Unspecified injury of unspecified wrist, hand and finger(s), initial encounter: Secondary | ICD-10-CM | POA: Insufficient documentation

## 2014-05-02 DIAGNOSIS — Z8679 Personal history of other diseases of the circulatory system: Secondary | ICD-10-CM | POA: Insufficient documentation

## 2014-05-02 MED ORDER — TETANUS-DIPHTH-ACELL PERTUSSIS 5-2.5-18.5 LF-MCG/0.5 IM SUSP: 0.5000 mL | Freq: Once | INTRAMUSCULAR | Status: DC

## 2014-05-02 NOTE — ED Provider Notes (Signed)
CSN: [id number]     Arrival date & time 05/02/14  2136 History   First MD Initiated Contact with Patient 05/02/14 2140     Chief Complaint  Patient presents with  . Loss of Consciousness  . Hand Injury     (Consider location/radiation/quality/duration/timing/severity/associated sxs/prior Treatment) HPI Comments: Patient presents to the emergency department with chief complaint of right thumb pain and syncopal episode. Reportedly, patient was helping her daughter get out of the car, when the car door closed on her right thumb. She reports moderate to severe pain in the right thumb, as well as associated bleeding. Patient reports that after seeing the bleeding, she became dizzy, and passed out. Currently, she only complains of pain in the right thumb. She denies any other symptoms at this time. She has not tried taking anything to alleviate her symptoms. The pain is aggravated with movement and palpation.  The history is provided by the patient. The history is limited by a language barrier. A language interpreter was used.    Past Medical History  Diagnosis Date  . Gestational HTN   . Pregnancy induced hypertension   . Chronic hypertension in pregnancy 03/29/2013    Start antenatal testing at 37 weeks.     Past Surgical History  Procedure Laterality Date  . No past surgeries     Family History  Problem Relation Age of Onset  . Hypertension Father   . Alcohol abuse Neg Hx   . Arthritis Neg Hx   . Asthma Neg Hx   . Birth defects Neg Hx   . Cancer Neg Hx   . COPD Neg Hx   . Depression Neg Hx   . Diabetes Neg Hx   . Drug abuse Neg Hx   . Early death Neg Hx   . Hearing loss Neg Hx   . Heart disease Neg Hx   . Hyperlipidemia Neg Hx   . Kidney disease Neg Hx   . Learning disabilities Neg Hx   . Mental illness Neg Hx   . Mental retardation Neg Hx   . Miscarriages / Stillbirths Neg Hx   . Stroke Neg Hx   . Vision loss Neg Hx    History  Substance Use Topics  . Smoking status:  Never Smoker   . Smokeless tobacco: Never Used  . Alcohol Use: No   OB History   Grav Para Term Preterm Abortions TAB SAB Ect Mult Living   3 3 2 1  0 0    2     Review of Systems  All other systems reviewed and are negative.     Allergies  Review of patient's allergies indicates no known allergies.  Home Medications   Prior to Admission medications   Not on File   BP 128/89  Pulse 73  Temp(Src) 98.8 F (37.1 C) (Oral)  Resp 14  SpO2 99%  LMP 04/10/2014  Breastfeeding? No Physical Exam  Nursing note and vitals reviewed. Constitutional: She is oriented to person, place, and time. She appears well-developed and well-nourished.  HENT:  Head: Normocephalic and atraumatic.  Eyes: Conjunctivae and EOM are normal. Pupils are equal, round, and reactive to light.  Neck: Normal range of motion. Neck supple.  Cardiovascular: Normal rate and regular rhythm.  Exam reveals no gallop and no friction rub.   No murmur heard. Pulmonary/Chest: Effort normal and breath sounds normal. No respiratory distress. She has no wheezes. She has no rales. She exhibits no tenderness.  Abdominal: Soft. Bowel sounds are  normal. She exhibits no distension and no mass. There is no tenderness. There is no rebound and no guarding.  Musculoskeletal: Normal range of motion. She exhibits no edema and no tenderness.  Right thumb tender to palpation, range of motion and strength limited secondary to pain, no bony abnormality or deformity  Neurological: She is alert and oriented to person, place, and time.  Skin: Skin is warm and dry.  And dried blood around the nail bed of the right thumb, no active bleeding  Psychiatric: She has a normal mood and affect. Her behavior is normal. Judgment and thought content normal.    ED Course  Procedures (including critical care time) Labs Review Labs Reviewed - No data to display  Imaging Review Dg Finger Thumb Right  05/02/2014   CLINICAL DATA:  Closed in door   EXAM: RIGHT THUMB 2+V  COMPARISON:  None.  FINDINGS: Normal alignment no fracture. Debris is present overlying the soft tissues.  IMPRESSION: Negative   Electronically Signed   By: Marlan Palauharles  Clark M.D.   On: 05/02/2014 22:54     EKG Interpretation   Date/Time:  Thursday May 02 2014 23:01:06 EDT Ventricular Rate:  74 PR Interval:  75 QRS Duration: 86 QT Interval:  412 QTC Calculation: 457 R Axis:   40 Text Interpretation:  Sinus rhythm Short PR interval without upsloping  Otherwise nml EKG Confirmed by DOCHERTY  MD, MEGAN ([id number]) on 05/02/2014  11:06:54 PM      MDM   Final diagnoses:  Thumb injury, right, initial encounter  Near syncope    Patient with thumb injury, after slamming her thumb in the car door. She then experienced a near syncopal episode. I suspect that this was vasovagal. Vitals, imaging, and EKG are unremarkable. Patient is stable and ready for discharge. She is ambulatory. She is not dizzy.   Roxy Horsemanobert Rhyan Wolters, PA-C 05/03/14 [phone]

## 2014-05-02 NOTE — ED Notes (Signed)
Patient reports shutting right thumb in door, felt dizzy and had a near syncope episode. Patient reports throbbing pain 8/10 to same. Patient reports numbness and tingling to same. Patient denies hitting head.

## 2014-05-02 NOTE — ED Notes (Signed)
Per EMS pt shut her right thumb in the car door  Pt went home and later had a syncopal episode that lasted one to two minutes per family  Pt is alert and oriented x 4 upon arrival  No acute distress noted  NSR on monitor  Saline lock started with a #22 in the left hand by EMS prior to arrival

## 2014-05-03 MED ORDER — HYDROCODONE-ACETAMINOPHEN 5-325 MG PO TABS: 1.0000 | ORAL_TABLET | Freq: Four times a day (QID) | ORAL | Status: DC | PRN

## 2014-05-03 MED ORDER — HYDROCODONE-ACETAMINOPHEN 5-325 MG PO TABS
2.0000 | ORAL_TABLET | Freq: Once | ORAL | Status: AC
  Administered 2014-05-03: 2 via ORAL
  Filled 2014-05-03: qty 2

## 2014-05-03 NOTE — ED Provider Notes (Signed)
Medical screening examination/treatment/procedure(s) were performed by non-physician practitioner and as supervising physician I was immediately available for consultation/collaboration.   EKG Interpretation   Date/Time:  Thursday May 02 2014 23:01:06 EDT Ventricular Rate:  74 PR Interval:  75 QRS Duration: 86 QT Interval:  412 QTC Calculation: 457 R Axis:   40 Text Interpretation:  Sinus rhythm Short PR interval without upsloping  Otherwise nml EKG Confirmed by Raevin Wierenga  MD, Bentli Llorente ([id number]) on 05/02/2014  11:06:54 PM        Toy CookeyMegan Mearle Drew, MD 05/03/14 2353

## 2014-05-03 NOTE — Discharge Instructions (Signed)
Blunt Trauma You have been evaluated for injuries. You have been examined and your caregiver has not found injuries serious enough to require hospitalization. It is common to have multiple bruises and sore muscles following an accident. These tend to feel worse for the first 24 hours. You will feel more stiffness and soreness over the next several hours and worse when you wake up the first morning after your accident. After this point, you should begin to improve with each passing day. The amount of improvement depends on the amount of damage done in the accident. Following your accident, if some part of your body does not work as it should, or if the pain in any area continues to increase, you should return to the Emergency Department for re-evaluation.  HOME CARE INSTRUCTIONS  Routine care for sore areas should include:  Ice to sore areas every 2 hours for 20 minutes while awake for the next 2 days.  Drink extra fluids (not alcohol).  Take a hot or warm shower or bath once or twice a day to increase blood flow to sore muscles. This will help you "limber up".  Activity as tolerated. Lifting may aggravate neck or back pain.  Only take over-the-counter or prescription medicines for pain, discomfort, or fever as directed by your caregiver. Do not use aspirin. This may increase bruising or increase bleeding if there are small areas where this is happening. SEEK IMMEDIATE MEDICAL CARE IF:  Numbness, tingling, weakness, or problem with the use of your arms or legs.  A severe headache is not relieved with medications.  There is a change in bowel or bladder control.  Increasing pain in any areas of the body.  Short of breath or dizzy.  Nauseated, vomiting, or sweating.  Increasing belly (abdominal) discomfort.  Blood in urine, stool, or vomiting blood.  Pain in either shoulder in an area where a shoulder strap would be.  Feelings of lightheadedness or if you have a fainting  episode. Sometimes it is not possible to identify all injuries immediately after the trauma. It is important that you continue to monitor your condition after the emergency department visit. If you feel you are not improving, or improving more slowly than should be expected, call your physician. If you feel your symptoms (problems) are worsening, return to the Emergency Department immediately. Document Released: 06/09/2001 Document Revised: 12/06/2011 Document Reviewed: 05/01/2008 [facility] Patient Information 2015 [place], [place]. This information is not intended to replace advice given to you by your health care provider. Make sure you discuss any questions you have with your health care provider. Syncope Syncope is a medical term for fainting or passing out. This means you lose consciousness and drop to the ground. People are generally unconscious for less than 5 minutes. You may have some muscle twitches for up to 15 seconds before waking up and returning to normal. Syncope occurs more often in older adults, but it can happen to anyone. While most causes of syncope are not dangerous, syncope can be a sign of a serious medical problem. It is important to seek medical care.  CAUSES  Syncope is caused by a sudden drop in blood flow to the brain. The specific cause is often not determined. Factors that can bring on syncope include:  Taking medicines that lower blood pressure.  Sudden changes in posture, such as standing up quickly.  Taking more medicine than prescribed.  Standing in one place for too long.  Seizure disorders.  Dehydration and excessive exposure to heat.  Low blood sugar (hypoglycemia).  Straining to have a bowel movement.  Heart disease, irregular heartbeat, or other circulatory problems.  Fear, emotional distress, seeing blood, or severe pain. SYMPTOMS  Right before fainting, you may:  Feel dizzy or light-headed.  Feel nauseous.  See all white or all black in your  field of vision.  Have cold, clammy skin. DIAGNOSIS  Your health care provider will ask about your symptoms, perform a physical exam, and perform an electrocardiogram (ECG) to record the electrical activity of your heart. Your health care provider may also perform other heart or blood tests to determine the cause of your syncope which may include:  Transthoracic echocardiogram (TTE). During echocardiography, sound waves are used to evaluate how blood flows through your heart.  Transesophageal echocardiogram (TEE).  Cardiac monitoring. This allows your health care provider to monitor your heart rate and rhythm in real time.  Holter monitor. This is a portable device that records your heartbeat and can help diagnose heart arrhythmias. It allows your health care provider to track your heart activity for several days, if needed.  Stress tests by exercise or by giving medicine that makes the heart beat faster. TREATMENT  In most cases, no treatment is needed. Depending on the cause of your syncope, your health care provider may recommend changing or stopping some of your medicines. HOME CARE INSTRUCTIONS  Have someone stay with you until you feel stable.  Do not drive, use machinery, or play sports until your health care provider says it is okay.  Keep all follow-up appointments as directed by your health care provider.  Lie down right away if you start feeling like you might faint. Breathe deeply and steadily. Wait until all the symptoms have passed.  Drink enough fluids to keep your urine clear or pale yellow.  If you are taking blood pressure or heart medicine, get up slowly and take several minutes to sit and then stand. This can reduce dizziness. SEEK IMMEDIATE MEDICAL CARE IF:   You have a severe headache.  You have unusual pain in the chest, abdomen, or back.  You are bleeding from your mouth or rectum, or you have black or tarry stool.  You have an irregular or very fast  heartbeat.  You have pain with breathing.  You have repeated fainting or seizure-like jerking during an episode.  You faint when sitting or lying down.  You have confusion.  You have trouble walking.  You have severe weakness.  You have vision problems. If you fainted, call your local emergency services (911 in [place] Do not drive yourself to the hospital.  MAKE SURE YOU:  Understand these instructions.  Will watch your condition.  Will get help right away if you are not doing well or get worse. Document Released: 09/13/2005 Document Revised: 09/18/2013 Document Reviewed: 11/12/2011 [facility] Patient Information 2015 [place], [place]. This information is not intended to replace advice given to you by your health care provider. Make sure you discuss any questions you have with your health care provider. Crush Injury, Fingers or Toes A crush injury to the fingers or toes means the tissues have been damaged by being squeezed (compressed). There will be bleeding into the tissues and swelling. Often, blood will collect under the skin. When this happens, the skin on the finger often dies and may slough off (shed) 1 week to 10 days later. Usually, new skin is growing underneath. If the injury has been too severe and the tissue does not survive, the damaged  tissue may begin to turn black over several days.  Wounds which occur because of the crushing may be stitched (sutured) shut. However, crush injuries are more likely to become infected than other injuries.These wounds may not be closed as tightly as other types of cuts to prevent infection. Nails involved are often lost. These usually grow back over several weeks.  DIAGNOSIS X-rays may be taken to see if there is any injury to the bones. TREATMENT Broken bones (fractures) may be treated with splinting, depending on the fracture. Often, no treatment is required for fractures of the last bone in the fingers or toes. HOME CARE INSTRUCTIONS    The crushed part should be raised (elevated) above the heart or center of the chest as much as possible for the first several days or as directed. This helps with pain and lessens swelling. Less swelling increases the chances that the crushed part will survive.  Put ice on the injured area.  Put ice in a plastic bag.  Place a towel between your skin and the bag.  Leave the ice on for 15-20 minutes, 03-04 times a day for the first 2 days.  Only take over-the-counter or prescription medicines for pain, discomfort, or fever as directed by your caregiver.  Use your injured part only as directed.  Change your bandages (dressings) as directed.  Keep all follow-up appointments as directed by your caregiver. Not keeping your appointment could result in a chronic or permanent injury, pain, and disability. If there is any problem keeping the appointment, you must call to reschedule. SEEK IMMEDIATE MEDICAL CARE IF:   There is redness, swelling, or increasing pain in the wound area.  Pus is coming from the wound.  You have a fever.  You notice a bad smell coming from the wound or dressing.  The edges of the wound do not stay together after the sutures have been removed.  You are unable to move the injured finger or toe. MAKE SURE YOU:   Understand these instructions.  Will watch your condition.  Will get help right away if you are not doing well or get worse. Document Released: 09/13/2005 Document Revised: 12/06/2011 Document Reviewed: 01/29/2011 [facility] Patient Information 2015 [place]. This information is not intended to replace advice given to you by your health care provider. Make sure you discuss any questions you have with your health care provider.

## 2014-06-28 ENCOUNTER — Encounter (HOSPITAL_COMMUNITY): Payer: Self-pay | Admitting: Emergency Medicine

## 2014-06-28 ENCOUNTER — Emergency Department (INDEPENDENT_AMBULATORY_CARE_PROVIDER_SITE_OTHER)
Admission: EM | Admit: 2014-06-28 | Discharge: 2014-06-28 | Disposition: A | Discharge status: Home / Self Care | Payer: Medicaid Other | Source: Home / Self Care | Attending: Family Medicine | Admitting: Family Medicine

## 2014-06-28 DIAGNOSIS — Z331 Pregnant state, incidental: Secondary | ICD-10-CM

## 2014-06-28 DIAGNOSIS — R109 Unspecified abdominal pain: Secondary | ICD-10-CM

## 2014-06-28 DIAGNOSIS — Z3201 Encounter for pregnancy test, result positive: Secondary | ICD-10-CM

## 2014-06-28 LAB — POCT PREGNANCY, URINE: Preg Test, Ur: POSITIVE — AB

## 2014-06-28 NOTE — ED Notes (Signed)
Patient reports she has not had a menstrual period since June. Patient reports last night she had some abdominal pain. Denies any bleeding or spotting. Patient is alert and oriented and in NAD. Swahili interpreter is on the line translating.

## 2014-06-28 NOTE — ED Provider Notes (Signed)
CSN: [id number]     Arrival date & time 06/28/14  1216 History   None    Chief Complaint  Patient presents with  . Possible Pregnancy  . Abdominal Pain   (Consider location/radiation/quality/duration/timing/severity/associated sxs/prior Treatment) Patient is a 28 y.o. female presenting with female genitourinary complaint. The history is provided by the patient.  Female GU Problem This is a new problem. The current episode started 2 days ago (G2P2 with LMP in 02/2014, no bleeding.). The problem has been resolved. Associated symptoms include abdominal pain.    Past Medical History  Diagnosis Date  . Gestational HTN   . Pregnancy induced hypertension   . Chronic hypertension in pregnancy 03/29/2013    Start antenatal testing at 37 weeks.     Past Surgical History  Procedure Laterality Date  . No past surgeries     Family History  Problem Relation Age of Onset  . Hypertension Father   . Alcohol abuse Neg Hx   . Arthritis Neg Hx   . Asthma Neg Hx   . Birth defects Neg Hx   . Cancer Neg Hx   . COPD Neg Hx   . Depression Neg Hx   . Diabetes Neg Hx   . Drug abuse Neg Hx   . Early death Neg Hx   . Hearing loss Neg Hx   . Heart disease Neg Hx   . Hyperlipidemia Neg Hx   . Kidney disease Neg Hx   . Learning disabilities Neg Hx   . Mental illness Neg Hx   . Mental retardation Neg Hx   . Miscarriages / Stillbirths Neg Hx   . Stroke Neg Hx   . Vision loss Neg Hx    History  Substance Use Topics  . Smoking status: Never Smoker   . Smokeless tobacco: Never Used  . Alcohol Use: No   OB History   Grav Para Term Preterm Abortions TAB SAB Ect Mult Living   3 3 2 1  0 0    2     Review of Systems  Constitutional: Negative.   Gastrointestinal: Positive for abdominal pain.  Genitourinary: Positive for menstrual problem and pelvic pain.    Allergies  Review of patient's allergies indicates no known allergies.  Home Medications   Prior to Admission medications   Medication  Sig Start Date End Date Taking? Authorizing Provider  HYDROcodone-acetaminophen (NORCO/VICODIN) 5-325 MG per tablet Take 1 tablet by mouth every 6 (six) hours as needed for moderate pain or severe pain. 05/03/14   Roxy Horseman, PA-C   BP 126/78  Pulse 85  Temp(Src) 99.2 F (37.3 C) (Oral)  Resp 16  SpO2 98%  LMP 03/28/2014 Physical Exam  Nursing note and vitals reviewed. Constitutional: She is oriented to person, place, and time. She appears well-developed and well-nourished.  Abdominal: Soft. Bowel sounds are normal. She exhibits mass. She exhibits no distension. There is no tenderness. There is no rebound and no guarding.  Gravid abd., no acute abd.  Neurological: She is alert and oriented to person, place, and time.  Skin: Skin is warm and dry.    ED Course  Procedures (including critical care time) Labs Review Labs Reviewed  POCT PREGNANCY, URINE - Abnormal; Notable for the following:    Preg Test, Ur POSITIVE (*)    All other components within normal limits   upreg- pos. Imaging Review No results found.   MDM   1. Pregnancy as incidental finding        Fayrene Fearing  Sallyanne Kuster Kindl, MD 06/28/14 1355

## 2014-06-28 NOTE — Discharge Instructions (Signed)
[facility]

## 2014-07-02 ENCOUNTER — Ambulatory Visit (INDEPENDENT_AMBULATORY_CARE_PROVIDER_SITE_OTHER): Payer: Self-pay | Admitting: *Deleted

## 2014-07-02 DIAGNOSIS — O3680X1 Pregnancy with inconclusive fetal viability, fetus 1: Secondary | ICD-10-CM

## 2014-07-02 LAB — US OB COMP + 14 WK

## 2014-07-02 NOTE — Progress Notes (Signed)
Pt presented to clinic registration window requesting to schedule 1st Prenatal visit. She has had no PNC this far and reports her last normal period as 03/21/14. She reports having some spotting in July but was not a normal period. Informal US for viability showed +FHR of 150 bpm per PW doppler. Two measurements of femur length taken and correlate with 15w 3d and 14w 5d.  Both measurements are within range of EGA by LMP. Fetal movement present. Initial PNV to be scheduled.

## 2014-07-03 ENCOUNTER — Ambulatory Visit: Payer: Self-pay

## 2014-07-09 ENCOUNTER — Encounter: Payer: Self-pay | Admitting: Family

## 2014-07-09 ENCOUNTER — Ambulatory Visit (INDEPENDENT_AMBULATORY_CARE_PROVIDER_SITE_OTHER): Payer: Medicaid Other | Admitting: Family

## 2014-07-09 ENCOUNTER — Other Ambulatory Visit (HOSPITAL_COMMUNITY)
Admission: RE | Admit: 2014-07-09 | Discharge: 2014-07-09 | Disposition: A | Discharge status: Home / Self Care | Payer: Medicaid Other | Source: Ambulatory Visit | Attending: Family | Admitting: Family

## 2014-07-09 VITALS — BP 115/77 | HR 81 | Temp 98.2°F | Wt 136.3 lb

## 2014-07-09 DIAGNOSIS — O0993 Supervision of high risk pregnancy, unspecified, third trimester: Secondary | ICD-10-CM | POA: Insufficient documentation

## 2014-07-09 DIAGNOSIS — O0992 Supervision of high risk pregnancy, unspecified, second trimester: Secondary | ICD-10-CM

## 2014-07-09 DIAGNOSIS — O10019 Pre-existing essential hypertension complicating pregnancy, unspecified trimester: Secondary | ICD-10-CM

## 2014-07-09 DIAGNOSIS — O161 Unspecified maternal hypertension, first trimester: Secondary | ICD-10-CM | POA: Diagnosis present

## 2014-07-09 DIAGNOSIS — Z124 Encounter for screening for malignant neoplasm of cervix: Secondary | ICD-10-CM

## 2014-07-09 DIAGNOSIS — Z113 Encounter for screening for infections with a predominantly sexual mode of transmission: Secondary | ICD-10-CM | POA: Diagnosis present

## 2014-07-09 DIAGNOSIS — Z118 Encounter for screening for other infectious and parasitic diseases: Secondary | ICD-10-CM

## 2014-07-09 DIAGNOSIS — Z01419 Encounter for gynecological examination (general) (routine) without abnormal findings: Secondary | ICD-10-CM | POA: Diagnosis present

## 2014-07-09 DIAGNOSIS — Z23 Encounter for immunization: Secondary | ICD-10-CM | POA: Diagnosis not present

## 2014-07-09 LAB — POCT URINALYSIS DIP (DEVICE)
Bilirubin Urine: NEGATIVE
Glucose, UA: NEGATIVE mg/dL
Hgb urine dipstick: NEGATIVE
Ketones, ur: NEGATIVE mg/dL
LEUKOCYTES UA: NEGATIVE
NITRITE: NEGATIVE
PH: 5.5 (ref 5.0–8.0)
Protein, ur: NEGATIVE mg/dL
Specific Gravity, Urine: 1.01 (ref 1.005–1.030)
UROBILINOGEN UA: 0.2 mg/dL (ref 0.0–1.0)

## 2014-07-09 MED ORDER — PRENATAL VITAMINS 0.8 MG PO TABS: 1.0000 | ORAL_TABLET | Freq: Every day | ORAL | Status: DC

## 2014-07-09 NOTE — Progress Notes (Signed)
Here for initial visit. Used Interpreter [employer]. States was feeling pain before, none for 2 weeks. Given new patient information. Discussed bmi / appropriate weight gain.

## 2014-07-09 NOTE — Progress Notes (Signed)
Subjective:    Carrie Duncan is a L2G4010G4P2102 2256w5d being seen today for her first obstetrical visit.  Her obstetrical history is significant for pregnancy induced hypertension. Documentation in Epic noted chronic hypertension, however upon review of records, blood pressure was normal during the postpartum period and at early pregnancy visits.  Patient does intend to breast feed. Pregnancy history fully reviewed.  Patient reports no bleeding, no cramping and early pregnancy abdominal pain that lasted approximately 5 days with resolution. Ceasar Mons.  Filed Vitals:   07/09/14 0952  BP: 115/77  Pulse: 81  Temp: 98.2 F (36.8 C)  Weight: 136 lb 4.8 oz (61.825 kg)    HISTORY: OB History  Gravida Para Term Preterm AB SAB TAB Ectopic Multiple Living  4 3 2 1  0  0   2    # Outcome Date GA Lbr Len/2nd Weight Sex Delivery Anes PTL Lv  4 CUR           3 TRM 04/13/13 7377w3d 09:32 / 00:35 6 lb 9.1 oz (2.98 kg) M SVD None  Y  2 TRM 01/2010 4636w0d  4 lb 6.6 oz (2 kg) F SVD   Y     Comments: HTN (required meds)  1 PRE 2009 8261w0d      N      Comments: Induction of labor due to blood pressue -  was born at 6 months     Past Medical History  Diagnosis Date  . Gestational HTN   . Pregnancy induced hypertension   . Chronic hypertension in pregnancy 03/29/2013    Start antenatal testing at 37 weeks.     Past Surgical History  Procedure Laterality Date  . No past surgeries     Family History  Problem Relation Age of Onset  . Hypertension Father   . Alcohol abuse Neg Hx   . Arthritis Neg Hx   . Asthma Neg Hx   . Birth defects Neg Hx   . Cancer Neg Hx   . COPD Neg Hx   . Depression Neg Hx   . Diabetes Neg Hx   . Drug abuse Neg Hx   . Early death Neg Hx   . Hearing loss Neg Hx   . Heart disease Neg Hx   . Hyperlipidemia Neg Hx   . Kidney disease Neg Hx   . Learning disabilities Neg Hx   . Mental illness Neg Hx   . Mental retardation Neg Hx   . Miscarriages / Stillbirths Neg Hx   . Stroke Neg  Hx   . Vision loss Neg Hx      Exam   BP 115/77  Pulse 81  Temp(Src) 98.2 F (36.8 C)  Wt 136 lb 4.8 oz (61.825 kg)  LMP 03/21/2014 Uterine Size: size equals dates  Pelvic Exam:    Perineum: No Hemorrhoids, Normal Perineum   Vulva: normal   Vagina:  normal mucosa, normal discharge, no palpable nodules   pH: Not done   Cervix: no bleeding following Pap, no cervical motion tenderness and no lesions   Adnexa: normal adnexa and no mass, fullness, tenderness   Bony Pelvis: Adequate  System: Breast:  No nipple retraction or dimpling, No nipple discharge or bleeding, No axillary or supraclavicular adenopathy, Normal to palpation without dominant masses   Skin: normal coloration and turgor, no rashes    Neurologic: negative   Extremities: normal strength, tone, and muscle mass   HEENT neck supple with midline trachea and thyroid without masses  Mouth/Teeth mucous membranes moist, pharynx normal without lesions   Neck supple and no masses   Cardiovascular: regular rate and rhythm, no murmurs or gallops   Respiratory:  appears well, vitals normal, no respiratory distress, acyanotic, normal RR, neck free of mass or lymphadenopathy, chest clear, no wheezing, crepitations, rhonchi, normal symmetric air entry   Abdomen: soft, non-tender; bowel sounds normal; no masses,  no organomegaly   Urinary: urethral meatus normal     Assessment:   28 yo G4P2102 at 7444w5d wks IUP History of Gestational Hypertension    Patient Active Problem List   Diagnosis Date Noted  . Supervision of high risk pregnancy in second trimester 07/09/2014  . Chronic hypertension in pregnancy 03/29/2013  . Sickle cell trait 11/30/2012        Plan:     Initial labs drawn. Prenatal vitamins. Problem list reviewed and updated. Genetic Screening discussed Quad Screen: ordered.  Ultrasound discussed; fetal survey: ordered.  Follow up in 4 weeks.  Marlis EdelsonKARIM, Jamar Weatherall N 07/09/2014

## 2014-07-10 LAB — OBSTETRIC PANEL
ANTIBODY SCREEN: NEGATIVE
BASOS ABS: 0 10*3/uL (ref 0.0–0.1)
BASOS PCT: 0 % (ref 0–1)
Eosinophils Absolute: 0.4 10*3/uL (ref 0.0–0.7)
Eosinophils Relative: 7 % — ABNORMAL HIGH (ref 0–5)
HCT: 37.4 % (ref 36.0–46.0)
Hemoglobin: 13 g/dL (ref 12.0–15.0)
Hepatitis B Surface Ag: NEGATIVE
Lymphocytes Relative: 31 % (ref 12–46)
Lymphs Abs: 1.6 10*3/uL (ref 0.7–4.0)
MCH: 29.7 pg (ref 26.0–34.0)
MCHC: 34.8 g/dL (ref 30.0–36.0)
MCV: 85.4 fL (ref 78.0–100.0)
MONO ABS: 0.3 10*3/uL (ref 0.1–1.0)
MONOS PCT: 5 % (ref 3–12)
Neutro Abs: 3 10*3/uL (ref 1.7–7.7)
Neutrophils Relative %: 57 % (ref 43–77)
Platelets: 292 10*3/uL (ref 150–400)
RBC: 4.38 MIL/uL (ref 3.87–5.11)
RDW: 14.1 % (ref 11.5–15.5)
RH TYPE: POSITIVE
RUBELLA: 16.3 {index} — AB (ref <0.90)
WBC: 5.2 10*3/uL (ref 4.0–10.5)

## 2014-07-10 LAB — HIV ANTIBODY (ROUTINE TESTING W REFLEX): HIV 1&2 Ab, 4th Generation: NONREACTIVE

## 2014-07-10 LAB — CYTOLOGY - PAP

## 2014-07-11 LAB — PRESCRIPTION MONITORING PROFILE (19 PANEL)
AMPHETAMINE/METH: NEGATIVE ng/mL
BUPRENORPHINE, URINE: NEGATIVE ng/mL
Barbiturate Screen, Urine: NEGATIVE ng/mL
Benzodiazepine Screen, Urine: NEGATIVE ng/mL
CANNABINOID SCRN UR: NEGATIVE ng/mL
COCAINE METABOLITES: NEGATIVE ng/mL
CREATININE, URINE: 123.6 mg/dL (ref >20.0)
Carisoprodol, Urine: NEGATIVE ng/mL
ECSTASY: NEGATIVE ng/mL
FENTANYL URINE: NEGATIVE ng/mL
MEPERIDINE UR: NEGATIVE ng/mL
METHADONE SCREEN, URINE: NEGATIVE ng/mL
METHAQUALONE SCREEN (URINE): NEGATIVE ng/mL
Nitrites, Initial: NEGATIVE ug/mL
OXYCODONE SCRN UR: NEGATIVE ng/mL
Opiate Screen, Urine: NEGATIVE ng/mL
PHENCYCLIDINE, UR: NEGATIVE ng/mL
Propoxyphene: NEGATIVE ng/mL
TAPENTADOLUR: NEGATIVE ng/mL
Tramadol Scrn, Ur: NEGATIVE ng/mL
Zolpidem, Urine: NEGATIVE ng/mL
pH, Initial: 6.4 pH (ref 4.5–8.9)

## 2014-07-11 LAB — CULTURE, OB URINE

## 2014-07-11 LAB — HEMOGLOBINOPATHY EVALUATION
HGB A2 QUANT: 3.4 % — AB (ref 2.2–3.2)
Hemoglobin Other: 0 %
Hgb A: 56.8 % — ABNORMAL LOW (ref 96.8–97.8)
Hgb F Quant: 0.4 % (ref 0.0–2.0)
Hgb S Quant: 39.4 % — ABNORMAL HIGH

## 2014-07-15 LAB — AFP, QUAD SCREEN
AFP: 47.4 ng/mL
Age Alone: 1:855 {titer}
CURR GEST AGE: 15.5 wks.days
Down Syndrome Scr Risk Est: 1:26900 {titer}
HCG, Total: 35.42 IU/mL
INH: 158.9 pg/mL
INTERPRETATION-AFP: NEGATIVE
MoM for AFP: 1.22
MoM for INH: 0.86
MoM for hCG: 0.71
Open Spina bifida: NEGATIVE
Tri 18 Scr Risk Est: NEGATIVE
UE3 MOM: 0.71
UE3 VALUE: 0.56 ng/mL

## 2014-07-16 ENCOUNTER — Other Ambulatory Visit: Payer: Self-pay | Admitting: Family

## 2014-07-16 ENCOUNTER — Encounter: Payer: Self-pay | Admitting: Family

## 2014-07-16 DIAGNOSIS — O99012 Anemia complicating pregnancy, second trimester: Secondary | ICD-10-CM | POA: Insufficient documentation

## 2014-07-16 MED ORDER — FERROUS SULFATE 325 (65 FE) MG PO TABS: 325.0000 mg | ORAL_TABLET | Freq: Two times a day (BID) | ORAL | Status: DC

## 2014-07-16 NOTE — Progress Notes (Signed)
Left message of voice mail for patient to call office.  Once call is returned inform pt to pick up RX for ferrous sulfate.

## 2014-07-18 ENCOUNTER — Encounter: Payer: Self-pay | Admitting: Family

## 2014-07-23 ENCOUNTER — Telehealth: Payer: Self-pay | Admitting: General Practice

## 2014-07-23 DIAGNOSIS — B379 Candidiasis, unspecified: Secondary | ICD-10-CM

## 2014-07-23 MED ORDER — FLUCONAZOLE 150 MG PO TABS: 150.0000 mg | ORAL_TABLET | Freq: Once | ORAL | Status: DC

## 2014-07-23 NOTE — Telephone Encounter (Signed)
Yeast infection found on pap. Med ordered. Called patient with pacific interpreter [phone]#[id number]- husband answered stating patient was at home and provided number of [phone]. Called patient there, no answer- left message that we are trying to reach you with some non urgent results please call us back at the clinics

## 2014-07-29 ENCOUNTER — Encounter: Payer: Self-pay | Admitting: Family

## 2014-07-30 ENCOUNTER — Ambulatory Visit (HOSPITAL_COMMUNITY)
Admission: RE | Admit: 2014-07-30 | Discharge: 2014-07-30 | Disposition: A | Discharge status: Home / Self Care | Payer: Medicaid Other | Source: Ambulatory Visit | Attending: Family | Admitting: Family

## 2014-07-30 DIAGNOSIS — O09892 Supervision of other high risk pregnancies, second trimester: Secondary | ICD-10-CM | POA: Insufficient documentation

## 2014-07-30 DIAGNOSIS — O0992 Supervision of high risk pregnancy, unspecified, second trimester: Secondary | ICD-10-CM

## 2014-07-30 DIAGNOSIS — Z3A18 18 weeks gestation of pregnancy: Secondary | ICD-10-CM | POA: Insufficient documentation

## 2014-07-30 DIAGNOSIS — D573 Sickle-cell trait: Secondary | ICD-10-CM | POA: Insufficient documentation

## 2014-07-30 DIAGNOSIS — O09899 Supervision of other high risk pregnancies, unspecified trimester: Secondary | ICD-10-CM | POA: Insufficient documentation

## 2014-07-30 DIAGNOSIS — Z1389 Encounter for screening for other disorder: Secondary | ICD-10-CM | POA: Insufficient documentation

## 2014-07-30 DIAGNOSIS — O99012 Anemia complicating pregnancy, second trimester: Secondary | ICD-10-CM | POA: Insufficient documentation

## 2014-07-30 NOTE — Telephone Encounter (Signed)
Attempted to contact patient with pacific interpreter [id number]. Informed patient of results and medication at pharmacy. Patient verbalized understanding and gratitude. No questions or concerns.

## 2014-08-07 ENCOUNTER — Encounter: Payer: Self-pay | Admitting: Obstetrics and Gynecology

## 2014-08-07 ENCOUNTER — Ambulatory Visit (INDEPENDENT_AMBULATORY_CARE_PROVIDER_SITE_OTHER): Payer: Medicaid Other | Admitting: Obstetrics and Gynecology

## 2014-08-07 VITALS — BP 126/84 | HR 101 | Temp 98.0°F | Wt 139.9 lb

## 2014-08-07 DIAGNOSIS — O10012 Pre-existing essential hypertension complicating pregnancy, second trimester: Secondary | ICD-10-CM

## 2014-08-07 DIAGNOSIS — O0992 Supervision of high risk pregnancy, unspecified, second trimester: Secondary | ICD-10-CM

## 2014-08-07 LAB — POCT URINALYSIS DIP (DEVICE)
Bilirubin Urine: NEGATIVE
Glucose, UA: NEGATIVE mg/dL
Hgb urine dipstick: NEGATIVE
KETONES UR: NEGATIVE mg/dL
LEUKOCYTES UA: NEGATIVE
Nitrite: NEGATIVE
PH: 5.5 (ref 5.0–8.0)
PROTEIN: NEGATIVE mg/dL
Specific Gravity, Urine: 1.015 (ref 1.005–1.030)
Urobilinogen, UA: 0.2 mg/dL (ref 0.0–1.0)

## 2014-08-07 NOTE — Progress Notes (Signed)
Fetal Echo scheduled @ [facility] [phone]) on 09/05/14 @ 1300.  US for growth scheduled @ [facility] on 09/11/14 @ 1000.  All information and instructions given to pt while using [facility] # [phone].  Pt agreed to plan of care and voiced understanding.

## 2014-08-07 NOTE — Progress Notes (Signed)
[employer] interpreter # [phone]

## 2014-08-07 NOTE — Patient Instructions (Signed)
Second Trimester of Pregnancy The second trimester is from week 13 through week 28, months 4 through 6. The second trimester is often a time when you feel your best. Your body has also adjusted to being pregnant, and you begin to feel better physically. Usually, morning sickness has lessened or quit completely, you may have more energy, and you may have an increase in appetite. The second trimester is also a time when the fetus is growing rapidly. At the end of the sixth month, the fetus is about 9 inches long and weighs about 1 pounds. You will likely begin to feel the baby move (quickening) between 18 and 20 weeks of the pregnancy. BODY CHANGES Your body goes through many changes during pregnancy. The changes vary from woman to woman.   Your weight will continue to increase. You will notice your lower abdomen bulging out.  You may begin to get stretch marks on your hips, abdomen, and breasts.  You may develop headaches that can be relieved by medicines approved by your health care provider.  You may urinate more often because the fetus is pressing on your bladder.  You may develop or continue to have heartburn as a result of your pregnancy.  You may develop constipation because certain hormones are causing the muscles that push waste through your intestines to slow down.  You may develop hemorrhoids or swollen, bulging veins (varicose veins).  You may have back pain because of the weight gain and pregnancy hormones relaxing your joints between the bones in your pelvis and as a result of a shift in weight and the muscles that support your balance.  Your breasts will continue to grow and be tender.  Your gums may bleed and may be sensitive to brushing and flossing.  Dark spots or blotches (chloasma, mask of pregnancy) may develop on your face. This will likely fade after the baby is born.  A dark line from your belly button to the pubic area (linea nigra) may appear. This will likely fade  after the baby is born.  You may have changes in your hair. These can include thickening of your hair, rapid growth, and changes in texture. Some women also have hair loss during or after pregnancy, or hair that feels dry or thin. Your hair will most likely return to normal after your baby is born. WHAT TO EXPECT AT YOUR PRENATAL VISITS During a routine prenatal visit:  You will be weighed to make sure you and the fetus are growing normally.  Your blood pressure will be taken.  Your abdomen will be measured to track your baby's growth.  The fetal heartbeat will be listened to.  Any test results from the previous visit will be discussed. Your health care provider may ask you:  How you are feeling.  If you are feeling the baby move.  If you have had any abnormal symptoms, such as leaking fluid, bleeding, severe headaches, or abdominal cramping.  If you have any questions. Other tests that may be performed during your second trimester include:  Blood tests that check for:  Low iron levels (anemia).  Gestational diabetes (between 24 and 28 weeks).  Rh antibodies.  Urine tests to check for infections, diabetes, or protein in the urine.  An ultrasound to confirm the proper growth and development of the baby.  An amniocentesis to check for possible genetic problems.  Fetal screens for spina bifida and Down syndrome. HOME CARE INSTRUCTIONS   Avoid all smoking, herbs, alcohol, and unprescribed   drugs. These chemicals affect the formation and growth of the baby.  Follow your health care provider's instructions regarding medicine use. There are medicines that are either safe or unsafe to take during pregnancy.  Exercise only as directed by your health care provider. Experiencing uterine cramps is a good sign to stop exercising.  Continue to eat regular, healthy meals.  Wear a good support bra for breast tenderness.  Do not use hot tubs, steam rooms, or saunas.  Wear your  seat belt at all times when driving.  Avoid raw meat, uncooked cheese, cat litter boxes, and soil used by cats. These carry germs that can cause birth defects in the baby.  Take your prenatal vitamins.  Try taking a stool softener (if your health care provider approves) if you develop constipation. Eat more high-fiber foods, such as fresh vegetables or fruit and whole grains. Drink plenty of fluids to keep your urine clear or pale yellow.  Take warm sitz baths to soothe any pain or discomfort caused by hemorrhoids. Use hemorrhoid cream if your health care provider approves.  If you develop varicose veins, wear support hose. Elevate your feet for 15 minutes, 3-4 times a day. Limit salt in your diet.  Avoid heavy lifting, wear low heel shoes, and practice good posture.  Rest with your legs elevated if you have leg cramps or low back pain.  Visit your dentist if you have not gone yet during your pregnancy. Use a soft toothbrush to brush your teeth and be gentle when you floss.  A sexual relationship may be continued unless your health care provider directs you otherwise.  Continue to go to all your prenatal visits as directed by your health care provider. SEEK MEDICAL CARE IF:   You have dizziness.  You have mild pelvic cramps, pelvic pressure, or nagging pain in the abdominal area.  You have persistent nausea, vomiting, or diarrhea.  You have a bad smelling vaginal discharge.  You have pain with urination. SEEK IMMEDIATE MEDICAL CARE IF:   You have a fever.  You are leaking fluid from your vagina.  You have spotting or bleeding from your vagina.  You have severe abdominal cramping or pain.  You have rapid weight gain or loss.  You have shortness of breath with chest pain.  You notice sudden or extreme swelling of your face, hands, ankles, feet, or legs.  You have not felt your baby move in over an hour.  You have severe headaches that do not go away with  medicine.  You have vision changes. Document Released: 09/07/2001 Document Revised: 09/18/2013 Document Reviewed: 11/14/2012 ExitCare Patient Information 2015 [employer]. This information is not intended to replace advice given to you by your health care provider. Make sure you discuss any questions you have with your health care provider.  

## 2014-08-07 NOTE — Progress Notes (Signed)
Language Line for interpretation. No concerns. Hgb 13 at NOB. FOB to get tested for sickle cell.  SUA on US> MFM recommends fetal echo and F/U growth 6 wks from 11/3. Diane to schedule fetal echo

## 2014-08-27 ENCOUNTER — Encounter: Payer: Self-pay | Admitting: Obstetrics & Gynecology

## 2014-09-11 ENCOUNTER — Encounter: Payer: Self-pay | Admitting: *Deleted

## 2014-09-11 ENCOUNTER — Encounter: Payer: Self-pay | Admitting: Obstetrics and Gynecology

## 2014-09-11 ENCOUNTER — Encounter (HOSPITAL_COMMUNITY): Payer: Self-pay

## 2014-09-11 ENCOUNTER — Ambulatory Visit (HOSPITAL_COMMUNITY)
Admission: RE | Admit: 2014-09-11 | Discharge: 2014-09-11 | Disposition: A | Discharge status: Home / Self Care | Payer: Medicaid Other | Source: Ambulatory Visit | Attending: Obstetrics and Gynecology | Admitting: Obstetrics and Gynecology

## 2014-09-11 ENCOUNTER — Ambulatory Visit (INDEPENDENT_AMBULATORY_CARE_PROVIDER_SITE_OTHER): Payer: Medicaid Other | Admitting: Obstetrics and Gynecology

## 2014-09-11 VITALS — BP 119/75 | HR 92 | Temp 98.0°F | Wt 144.4 lb

## 2014-09-11 VITALS — BP 134/77 | HR 85 | Wt 145.5 lb

## 2014-09-11 DIAGNOSIS — O09292 Supervision of pregnancy with other poor reproductive or obstetric history, second trimester: Secondary | ICD-10-CM | POA: Diagnosis not present

## 2014-09-11 DIAGNOSIS — IMO0001 Reserved for inherently not codable concepts without codable children: Secondary | ICD-10-CM

## 2014-09-11 DIAGNOSIS — O99012 Anemia complicating pregnancy, second trimester: Secondary | ICD-10-CM | POA: Diagnosis not present

## 2014-09-11 DIAGNOSIS — O0992 Supervision of high risk pregnancy, unspecified, second trimester: Secondary | ICD-10-CM

## 2014-09-11 DIAGNOSIS — D573 Sickle-cell trait: Secondary | ICD-10-CM | POA: Insufficient documentation

## 2014-09-11 DIAGNOSIS — Q270 Congenital absence and hypoplasia of umbilical artery: Secondary | ICD-10-CM

## 2014-09-11 DIAGNOSIS — Z3A24 24 weeks gestation of pregnancy: Secondary | ICD-10-CM | POA: Insufficient documentation

## 2014-09-11 DIAGNOSIS — O10019 Pre-existing essential hypertension complicating pregnancy, unspecified trimester: Secondary | ICD-10-CM

## 2014-09-11 DIAGNOSIS — K0889 Other specified disorders of teeth and supporting structures: Secondary | ICD-10-CM

## 2014-09-11 DIAGNOSIS — K088 Other specified disorders of teeth and supporting structures: Secondary | ICD-10-CM

## 2014-09-11 LAB — POCT URINALYSIS DIP (DEVICE)
Bilirubin Urine: NEGATIVE
GLUCOSE, UA: NEGATIVE mg/dL
HGB URINE DIPSTICK: NEGATIVE
Ketones, ur: NEGATIVE mg/dL
LEUKOCYTES UA: NEGATIVE
Nitrite: NEGATIVE
PH: 7 (ref 5.0–8.0)
Protein, ur: NEGATIVE mg/dL
Specific Gravity, Urine: 1.015 (ref 1.005–1.030)
UROBILINOGEN UA: 0.2 mg/dL (ref 0.0–1.0)

## 2014-09-11 NOTE — Progress Notes (Signed)
Interpreter here.  Doing well except dental pain. Dental referral. SUA> F/U growth scan done today, result pending [facility]. Had fetal ECHO 08/07/14>need report

## 2014-09-11 NOTE — Progress Notes (Signed)
Language Resource Carrie Duncan Pt states she is having issues with her teeth and need to see a dentist

## 2014-09-12 ENCOUNTER — Other Ambulatory Visit (HOSPITAL_COMMUNITY): Payer: Self-pay

## 2014-09-12 ENCOUNTER — Encounter: Payer: Self-pay | Admitting: Obstetrics and Gynecology

## 2014-09-27 NOTE — L&D Delivery Note (Signed)
Delivery Note At 6:38 PM a healthy female was delivered via Vaginal, Spontaneous Delivery (Presentation: Right Occiput Anterior).  APGAR: 9, 9; weight pending.   Placenta status: Intact, Spontaneous.  Cord: 2 vessels with the following complications: None.    Anesthesia: None  Episiotomy: None Lacerations: None Suture Repair: 3.0 vicryl Est. Blood Loss (mL): 100  Mom to postpartum.  Baby to Couplet care / Skin to Skin.  Breastfeeding No circumcision Nexplanon for contraception  Jazzlene Huot 12/19/2014, 7:01 PM

## 2014-10-09 ENCOUNTER — Ambulatory Visit (INDEPENDENT_AMBULATORY_CARE_PROVIDER_SITE_OTHER): Payer: Medicaid Other | Admitting: Family

## 2014-10-09 ENCOUNTER — Ambulatory Visit (HOSPITAL_COMMUNITY)
Admission: RE | Admit: 2014-10-09 | Discharge: 2014-10-09 | Disposition: A | Discharge status: Home / Self Care | Payer: Medicaid Other | Source: Ambulatory Visit | Attending: Family | Admitting: Family

## 2014-10-09 VITALS — BP 123/83 | HR 79 | Temp 98.1°F | Wt 147.5 lb

## 2014-10-09 DIAGNOSIS — I10 Essential (primary) hypertension: Secondary | ICD-10-CM | POA: Insufficient documentation

## 2014-10-09 DIAGNOSIS — Z3493 Encounter for supervision of normal pregnancy, unspecified, third trimester: Secondary | ICD-10-CM

## 2014-10-09 DIAGNOSIS — O09213 Supervision of pregnancy with history of pre-term labor, third trimester: Secondary | ICD-10-CM | POA: Diagnosis not present

## 2014-10-09 DIAGNOSIS — Q270 Congenital absence and hypoplasia of umbilical artery: Secondary | ICD-10-CM

## 2014-10-09 DIAGNOSIS — O99013 Anemia complicating pregnancy, third trimester: Secondary | ICD-10-CM | POA: Insufficient documentation

## 2014-10-09 DIAGNOSIS — Z3A28 28 weeks gestation of pregnancy: Secondary | ICD-10-CM | POA: Insufficient documentation

## 2014-10-09 DIAGNOSIS — O10913 Unspecified pre-existing hypertension complicating pregnancy, third trimester: Secondary | ICD-10-CM

## 2014-10-09 DIAGNOSIS — O10019 Pre-existing essential hypertension complicating pregnancy, unspecified trimester: Secondary | ICD-10-CM

## 2014-10-09 DIAGNOSIS — D573 Sickle-cell trait: Secondary | ICD-10-CM | POA: Diagnosis not present

## 2014-10-09 DIAGNOSIS — IMO0001 Reserved for inherently not codable concepts without codable children: Secondary | ICD-10-CM

## 2014-10-09 DIAGNOSIS — O0992 Supervision of high risk pregnancy, unspecified, second trimester: Secondary | ICD-10-CM

## 2014-10-09 DIAGNOSIS — Z23 Encounter for immunization: Secondary | ICD-10-CM

## 2014-10-09 LAB — POCT URINALYSIS DIP (DEVICE)
BILIRUBIN URINE: NEGATIVE
Glucose, UA: NEGATIVE mg/dL
HGB URINE DIPSTICK: NEGATIVE
Ketones, ur: NEGATIVE mg/dL
Leukocytes, UA: NEGATIVE
NITRITE: NEGATIVE
Protein, ur: NEGATIVE mg/dL
Specific Gravity, Urine: 1.01 (ref 1.005–1.030)
Urobilinogen, UA: 0.2 mg/dL (ref 0.0–1.0)
pH: 6 (ref 5.0–8.0)

## 2014-10-09 LAB — CBC
HEMATOCRIT: 35.8 % — AB (ref 36.0–46.0)
Hemoglobin: 12.5 g/dL (ref 12.0–15.0)
MCH: 30.7 pg (ref 26.0–34.0)
MCHC: 34.9 g/dL (ref 30.0–36.0)
MCV: 88 fL (ref 78.0–100.0)
MPV: 9.7 fL (ref 8.6–12.4)
Platelets: 260 10*3/uL (ref 150–400)
RBC: 4.07 MIL/uL (ref 3.87–5.11)
RDW: 13.7 % (ref 11.5–15.5)
WBC: 7.5 10*3/uL (ref 4.0–10.5)

## 2014-10-09 MED ORDER — TETANUS-DIPHTH-ACELL PERTUSSIS 5-2.5-18.5 LF-MCG/0.5 IM SUSP
0.5000 mL | Freq: Once | INTRAMUSCULAR | Status: AC
  Administered 2014-10-09: 0.5 mL via INTRAMUSCULAR

## 2014-10-09 NOTE — Progress Notes (Signed)
28 wk labs with 1 hour glucose Tdap vaccine

## 2014-10-09 NOTE — Progress Notes (Signed)
Doing well.  No questions or concerns.  Urine results not available at discharge.  Pt transferred to HR clinic.  History of gestational hypertension with past pregnancies and slightly increasing blood pressure.  Third trimester labs today.

## 2014-10-10 ENCOUNTER — Encounter: Payer: Medicaid Other | Admitting: Family

## 2014-10-10 LAB — RPR

## 2014-10-10 LAB — HIV ANTIBODY (ROUTINE TESTING W REFLEX): HIV 1&2 Ab, 4th Generation: NONREACTIVE

## 2014-10-10 LAB — GLUCOSE TOLERANCE, 1 HOUR (50G) W/O FASTING: GLUCOSE 1 HOUR GTT: 112 mg/dL (ref 70–140)

## 2014-10-24 ENCOUNTER — Ambulatory Visit (INDEPENDENT_AMBULATORY_CARE_PROVIDER_SITE_OTHER): Payer: Medicaid Other | Admitting: Obstetrics & Gynecology

## 2014-10-24 ENCOUNTER — Encounter: Payer: Self-pay | Admitting: Obstetrics & Gynecology

## 2014-10-24 VITALS — BP 125/81 | HR 86 | Temp 97.9°F | Wt 149.0 lb

## 2014-10-24 DIAGNOSIS — O10913 Unspecified pre-existing hypertension complicating pregnancy, third trimester: Secondary | ICD-10-CM

## 2014-10-24 DIAGNOSIS — O0992 Supervision of high risk pregnancy, unspecified, second trimester: Secondary | ICD-10-CM

## 2014-10-24 LAB — POCT URINALYSIS DIP (DEVICE)
BILIRUBIN URINE: NEGATIVE
Glucose, UA: NEGATIVE mg/dL
Hgb urine dipstick: NEGATIVE
Ketones, ur: NEGATIVE mg/dL
Leukocytes, UA: NEGATIVE
Nitrite: NEGATIVE
PH: 7 (ref 5.0–8.0)
Protein, ur: NEGATIVE mg/dL
SPECIFIC GRAVITY, URINE: 1.015 (ref 1.005–1.030)
Urobilinogen, UA: 0.2 mg/dL (ref 0.0–1.0)

## 2014-10-24 NOTE — Patient Instructions (Signed)
Contraception Choices Contraception (birth control) is the use of any methods or devices to prevent pregnancy. Below are some methods to help avoid pregnancy. HORMONAL METHODS   Contraceptive implant. This is a thin, plastic tube containing progesterone hormone. It does not contain estrogen hormone. Your health care provider inserts the tube in the inner part of the upper arm. The tube can remain in place for up to 3 years. After 3 years, the implant must be removed. The implant prevents the ovaries from releasing an egg (ovulation), thickens the cervical mucus to prevent sperm from entering the uterus, and thins the lining of the inside of the uterus.  Progesterone-only injections. These injections are given every 3 months by your health care provider to prevent pregnancy. This synthetic progesterone hormone stops the ovaries from releasing eggs. It also thickens cervical mucus and changes the uterine lining. This makes it harder for sperm to survive in the uterus.  Birth control pills. These pills contain estrogen and progesterone hormone. They work by preventing the ovaries from releasing eggs (ovulation). They also cause the cervical mucus to thicken, preventing the sperm from entering the uterus. Birth control pills are prescribed by a health care provider.Birth control pills can also be used to treat heavy periods.  Minipill. This type of birth control pill contains only the progesterone hormone. They are taken every day of each month and must be prescribed by your health care provider.  Birth control patch. The patch contains hormones similar to those in birth control pills. It must be changed once a week and is prescribed by a health care provider.  Vaginal ring. The ring contains hormones similar to those in birth control pills. It is left in the vagina for 3 weeks, removed for 1 week, and then a new one is put back in place. The patient must be comfortable inserting and removing the ring  from the vagina.A health care provider's prescription is necessary.  Emergency contraception. Emergency contraceptives prevent pregnancy after unprotected sexual intercourse. This pill can be taken right after sex or up to 5 days after unprotected sex. It is most effective the sooner you take the pills after having sexual intercourse. Most emergency contraceptive pills are available without a prescription. Check with your pharmacist. Do not use emergency contraception as your only form of birth control. BARRIER METHODS   Female condom. This is a thin sheath (latex or rubber) that is worn over the penis during sexual intercourse. It can be used with spermicide to increase effectiveness.  Female condom. This is a soft, loose-fitting sheath that is put into the vagina before sexual intercourse.  Diaphragm. This is a soft, latex, dome-shaped barrier that must be fitted by a health care provider. It is inserted into the vagina, along with a spermicidal jelly. It is inserted before intercourse. The diaphragm should be left in the vagina for 6 to 8 hours after intercourse.  Cervical cap. This is a round, soft, latex or plastic cup that fits over the cervix and must be fitted by a health care provider. The cap can be left in place for up to 48 hours after intercourse.  Sponge. This is a soft, circular piece of polyurethane foam. The sponge has spermicide in it. It is inserted into the vagina after wetting it and before sexual intercourse.  Spermicides. These are chemicals that kill or block sperm from entering the cervix and uterus. They come in the form of creams, jellies, suppositories, foam, or tablets. They do not require a   prescription. They are inserted into the vagina with an applicator before having sexual intercourse. The process must be repeated every time you have sexual intercourse. INTRAUTERINE CONTRACEPTION  Intrauterine device (IUD). This is a T-shaped device that is put in a woman's uterus  during a menstrual period to prevent pregnancy. There are 2 types:  Copper IUD. This type of IUD is wrapped in copper wire and is placed inside the uterus. Copper makes the uterus and fallopian tubes produce a fluid that kills sperm. It can stay in place for 10 years.  Hormone IUD. This type of IUD contains the hormone progestin (synthetic progesterone). The hormone thickens the cervical mucus and prevents sperm from entering the uterus, and it also thins the uterine lining to prevent implantation of a fertilized egg. The hormone can weaken or kill the sperm that get into the uterus. It can stay in place for 3-5 years, depending on which type of IUD is used. PERMANENT METHODS OF CONTRACEPTION  Female tubal ligation. This is when the woman's fallopian tubes are surgically sealed, tied, or blocked to prevent the egg from traveling to the uterus.  Hysteroscopic sterilization. This involves placing a small coil or insert into each fallopian tube. Your doctor uses a technique called hysteroscopy to do the procedure. The device causes scar tissue to form. This results in permanent blockage of the fallopian tubes, so the sperm cannot fertilize the egg. It takes about 3 months after the procedure for the tubes to become blocked. You must use another form of birth control for these 3 months.  Female sterilization. This is when the female has the tubes that carry sperm tied off (vasectomy).This blocks sperm from entering the vagina during sexual intercourse. After the procedure, the man can still ejaculate fluid (semen). NATURAL PLANNING METHODS  Natural family planning. This is not having sexual intercourse or using a barrier method (condom, diaphragm, cervical cap) on days the woman could become pregnant.  Calendar method. This is keeping track of the length of each menstrual cycle and identifying when you are fertile.  Ovulation method. This is avoiding sexual intercourse during ovulation.  Symptothermal  method. This is avoiding sexual intercourse during ovulation, using a thermometer and ovulation symptoms.  Post-ovulation method. This is timing sexual intercourse after you have ovulated. Regardless of which type or method of contraception you choose, it is important that you use condoms to protect against the transmission of sexually transmitted infections (STIs). Talk with your health care provider about which form of contraception is most appropriate for you. Document Released: 09/13/2005 Document Revised: 09/18/2013 Document Reviewed: 03/08/2013 ExitCare Patient Information 2015 [employer]. This information is not intended to replace advice given to you by your health care provider. Make sure you discuss any questions you have with your health care provider.  

## 2014-10-24 NOTE — Progress Notes (Signed)
BP stable.  Last growth 39%.  Has f/u growth US next month.  No signs of preeclampsia.  Nml fetal echo.

## 2014-11-06 ENCOUNTER — Other Ambulatory Visit (HOSPITAL_COMMUNITY): Payer: Self-pay | Admitting: Obstetrics and Gynecology

## 2014-11-06 ENCOUNTER — Encounter (HOSPITAL_COMMUNITY): Payer: Self-pay

## 2014-11-06 ENCOUNTER — Ambulatory Visit (HOSPITAL_COMMUNITY)
Admission: RE | Admit: 2014-11-06 | Discharge: 2014-11-06 | Disposition: A | Discharge status: Home / Self Care | Payer: Medicaid Other | Source: Ambulatory Visit | Attending: Obstetrics & Gynecology | Admitting: Obstetrics & Gynecology

## 2014-11-06 DIAGNOSIS — Z3A32 32 weeks gestation of pregnancy: Secondary | ICD-10-CM | POA: Insufficient documentation

## 2014-11-06 DIAGNOSIS — O10913 Unspecified pre-existing hypertension complicating pregnancy, third trimester: Secondary | ICD-10-CM

## 2014-11-06 DIAGNOSIS — D573 Sickle-cell trait: Secondary | ICD-10-CM | POA: Insufficient documentation

## 2014-11-06 DIAGNOSIS — O99013 Anemia complicating pregnancy, third trimester: Secondary | ICD-10-CM | POA: Insufficient documentation

## 2014-11-06 DIAGNOSIS — Q270 Congenital absence and hypoplasia of umbilical artery: Secondary | ICD-10-CM

## 2014-11-06 DIAGNOSIS — O09293 Supervision of pregnancy with other poor reproductive or obstetric history, third trimester: Secondary | ICD-10-CM | POA: Insufficient documentation

## 2014-11-06 DIAGNOSIS — IMO0001 Reserved for inherently not codable concepts without codable children: Secondary | ICD-10-CM

## 2014-11-07 ENCOUNTER — Encounter: Payer: Medicaid Other | Admitting: Obstetrics & Gynecology

## 2014-11-14 ENCOUNTER — Ambulatory Visit (INDEPENDENT_AMBULATORY_CARE_PROVIDER_SITE_OTHER): Payer: Medicaid Other | Admitting: Family Medicine

## 2014-11-14 VITALS — BP 128/88 | HR 81 | Temp 98.0°F | Wt 153.7 lb

## 2014-11-14 DIAGNOSIS — O163 Unspecified maternal hypertension, third trimester: Secondary | ICD-10-CM

## 2014-11-14 DIAGNOSIS — O133 Gestational [pregnancy-induced] hypertension without significant proteinuria, third trimester: Secondary | ICD-10-CM

## 2014-11-14 DIAGNOSIS — O0992 Supervision of high risk pregnancy, unspecified, second trimester: Secondary | ICD-10-CM

## 2014-11-14 LAB — CBC
HEMATOCRIT: 38.1 % (ref 36.0–46.0)
HEMOGLOBIN: 13 g/dL (ref 12.0–15.0)
MCH: 30.3 pg (ref 26.0–34.0)
MCHC: 34.1 g/dL (ref 30.0–36.0)
MCV: 88.8 fL (ref 78.0–100.0)
MPV: 9.8 fL (ref 8.6–12.4)
PLATELETS: 252 10*3/uL (ref 150–400)
RBC: 4.29 MIL/uL (ref 3.87–5.11)
RDW: 14.5 % (ref 11.5–15.5)
WBC: 7.4 10*3/uL (ref 4.0–10.5)

## 2014-11-14 LAB — POCT URINALYSIS DIP (DEVICE)
BILIRUBIN URINE: NEGATIVE
GLUCOSE, UA: NEGATIVE mg/dL
Hgb urine dipstick: NEGATIVE
KETONES UR: NEGATIVE mg/dL
Leukocytes, UA: NEGATIVE
Nitrite: NEGATIVE
PH: 7 (ref 5.0–8.0)
Protein, ur: NEGATIVE mg/dL
Specific Gravity, Urine: 1.015 (ref 1.005–1.030)
UROBILINOGEN UA: 0.2 mg/dL (ref 0.0–1.0)

## 2014-11-14 LAB — COMPREHENSIVE METABOLIC PANEL
ALBUMIN: 3.2 g/dL — AB (ref 3.5–5.2)
ALT: 13 U/L (ref 0–35)
AST: 16 U/L (ref 0–37)
Alkaline Phosphatase: 85 U/L (ref 39–117)
BILIRUBIN TOTAL: 0.3 mg/dL (ref 0.2–1.2)
BUN: 4 mg/dL — ABNORMAL LOW (ref 6–23)
CO2: 23 mEq/L (ref 19–32)
Calcium: 8.4 mg/dL (ref 8.4–10.5)
Chloride: 107 mEq/L (ref 96–112)
Creat: 0.47 mg/dL — ABNORMAL LOW (ref 0.50–1.10)
Glucose, Bld: 74 mg/dL (ref 70–99)
POTASSIUM: 3.7 meq/L (ref 3.5–5.3)
SODIUM: 139 meq/L (ref 135–145)
TOTAL PROTEIN: 5.9 g/dL — AB (ref 6.0–8.3)

## 2014-11-14 NOTE — Progress Notes (Signed)
Patient is 29 y.o. [id number].  +FM, denies LOF, VB, contractions, vaginal discharge.  Overall feeling well.  No HA/visual changes/RUQ pain/increased swelling, DTR 2+ - elev BP x 1, repeat normal => preE labs ordered from clinic

## 2014-11-14 NOTE — Progress Notes (Signed)
Pressure- when she walks

## 2014-11-15 LAB — PROTEIN / CREATININE RATIO, URINE
Creatinine, Urine: 115.4 mg/dL
Protein Creatinine Ratio: 0.16 — ABNORMAL HIGH (ref <0.15)
TOTAL PROTEIN, URINE: 19 mg/dL (ref 5–24)

## 2014-11-28 ENCOUNTER — Inpatient Hospital Stay (HOSPITAL_COMMUNITY)
Admission: AD | Admit: 2014-11-28 | Discharge: 2014-11-28 | Disposition: A | Discharge status: Home / Self Care | Payer: Medicaid Other | Source: Ambulatory Visit | Attending: Obstetrics & Gynecology | Admitting: Obstetrics & Gynecology

## 2014-11-28 ENCOUNTER — Encounter (HOSPITAL_COMMUNITY): Payer: Self-pay | Admitting: *Deleted

## 2014-11-28 ENCOUNTER — Ambulatory Visit (INDEPENDENT_AMBULATORY_CARE_PROVIDER_SITE_OTHER): Payer: Medicaid Other | Admitting: Family

## 2014-11-28 ENCOUNTER — Other Ambulatory Visit: Payer: Self-pay | Admitting: Family

## 2014-11-28 VITALS — BP 144/92 | HR 90 | Temp 98.8°F | Wt 154.8 lb

## 2014-11-28 DIAGNOSIS — O99013 Anemia complicating pregnancy, third trimester: Secondary | ICD-10-CM

## 2014-11-28 DIAGNOSIS — O163 Unspecified maternal hypertension, third trimester: Secondary | ICD-10-CM | POA: Diagnosis not present

## 2014-11-28 DIAGNOSIS — O99012 Anemia complicating pregnancy, second trimester: Secondary | ICD-10-CM

## 2014-11-28 DIAGNOSIS — Z3A36 36 weeks gestation of pregnancy: Secondary | ICD-10-CM | POA: Diagnosis not present

## 2014-11-28 DIAGNOSIS — Z3A35 35 weeks gestation of pregnancy: Secondary | ICD-10-CM

## 2014-11-28 DIAGNOSIS — O09293 Supervision of pregnancy with other poor reproductive or obstetric history, third trimester: Secondary | ICD-10-CM

## 2014-11-28 DIAGNOSIS — O133 Gestational [pregnancy-induced] hypertension without significant proteinuria, third trimester: Secondary | ICD-10-CM

## 2014-11-28 DIAGNOSIS — O10913 Unspecified pre-existing hypertension complicating pregnancy, third trimester: Secondary | ICD-10-CM

## 2014-11-28 DIAGNOSIS — O0992 Supervision of high risk pregnancy, unspecified, second trimester: Secondary | ICD-10-CM

## 2014-11-28 LAB — COMPREHENSIVE METABOLIC PANEL
ALBUMIN: 3.2 g/dL — AB (ref 3.5–5.2)
ALT: 20 U/L (ref 0–35)
ANION GAP: 7 (ref 5–15)
AST: 20 U/L (ref 0–37)
Alkaline Phosphatase: 100 U/L (ref 39–117)
BUN: 5 mg/dL — ABNORMAL LOW (ref 6–23)
CO2: 23 mmol/L (ref 19–32)
Calcium: 8.6 mg/dL (ref 8.4–10.5)
Chloride: 107 mmol/L (ref 96–112)
Creatinine, Ser: 0.51 mg/dL (ref 0.50–1.10)
GFR calc Af Amer: >90 mL/min (ref >90)
GLUCOSE: 90 mg/dL (ref 70–99)
POTASSIUM: 3.5 mmol/L (ref 3.5–5.1)
SODIUM: 137 mmol/L (ref 135–145)
Total Bilirubin: 0.5 mg/dL (ref 0.3–1.2)
Total Protein: 6.3 g/dL (ref 6.0–8.3)

## 2014-11-28 LAB — URINALYSIS, ROUTINE W REFLEX MICROSCOPIC
Bilirubin Urine: NEGATIVE
Glucose, UA: NEGATIVE mg/dL
Hgb urine dipstick: NEGATIVE
Ketones, ur: NEGATIVE mg/dL
Leukocytes, UA: NEGATIVE
NITRITE: NEGATIVE
PH: 7 (ref 5.0–8.0)
Protein, ur: NEGATIVE mg/dL
Specific Gravity, Urine: 1.01 (ref 1.005–1.030)
UROBILINOGEN UA: 0.2 mg/dL (ref 0.0–1.0)

## 2014-11-28 LAB — CBC WITH DIFFERENTIAL/PLATELET
BASOS PCT: 0 % (ref 0–1)
Basophils Absolute: 0 10*3/uL (ref 0.0–0.1)
EOS ABS: 0.8 10*3/uL — AB (ref 0.0–0.7)
Eosinophils Relative: 10 % — ABNORMAL HIGH (ref 0–5)
HCT: 37.2 % (ref 36.0–46.0)
Hemoglobin: 13.2 g/dL (ref 12.0–15.0)
LYMPHS ABS: 1.7 10*3/uL (ref 0.7–4.0)
LYMPHS PCT: 24 % (ref 12–46)
MCH: 30.9 pg (ref 26.0–34.0)
MCHC: 35.5 g/dL (ref 30.0–36.0)
MCV: 87.1 fL (ref 78.0–100.0)
MONOS PCT: 7 % (ref 3–12)
Monocytes Absolute: 0.5 10*3/uL (ref 0.1–1.0)
NEUTROS ABS: 4.2 10*3/uL (ref 1.7–7.7)
NEUTROS PCT: 59 % (ref 43–77)
PLATELETS: 230 10*3/uL (ref 150–400)
RBC: 4.27 MIL/uL (ref 3.87–5.11)
RDW: 13.4 % (ref 11.5–15.5)
WBC: 7.2 10*3/uL (ref 4.0–10.5)

## 2014-11-28 LAB — POCT URINALYSIS DIP (DEVICE)
Bilirubin Urine: NEGATIVE
Glucose, UA: NEGATIVE mg/dL
Hgb urine dipstick: NEGATIVE
Ketones, ur: NEGATIVE mg/dL
Leukocytes, UA: NEGATIVE
Nitrite: NEGATIVE
Protein, ur: NEGATIVE mg/dL
Specific Gravity, Urine: 1.01 (ref 1.005–1.030)
Urobilinogen, UA: 0.2 mg/dL (ref 0.0–1.0)
pH: 7 (ref 5.0–8.0)

## 2014-11-28 LAB — PROTEIN / CREATININE RATIO, URINE
Creatinine, Urine: 89 mg/dL
Protein Creatinine Ratio: 0.12 (ref 0.00–0.15)
Total Protein, Urine: 11 mg/dL

## 2014-11-28 LAB — OB RESULTS CONSOLE GC/CHLAMYDIA
Chlamydia: NEGATIVE
Gonorrhea: NEGATIVE

## 2014-11-28 LAB — OB RESULTS CONSOLE GBS: GBS: POSITIVE

## 2014-11-28 NOTE — MAU Note (Signed)
Sent fro ob to evaluate BP;

## 2014-11-28 NOTE — Progress Notes (Signed)
Pain- pelvic area

## 2014-11-28 NOTE — MAU Provider Note (Signed)
History     CSN: [id number]  Arrival date and time: 11/28/14 1211   None     Chief Complaint  Patient presents with  . Hypertension   HPI   29 y.o. [id number] at [redacted]w[redacted]d with prior vaginal deliveries; found to have BP of 144/92 in office today and sent to MAU for PIH workup.  She denies headaches, visual changes, N/V.  One episode of dizziness a few days ago.  History of pre-eclampsia in previous pregnancy.She reports good fetal movement, denies LOF, vaginal bleeding, vaginal itching/burning, urinary symptoms, n/v, or fever/chills.     Clinic [facility] Prenatal Labs  Dating Certain LMP, c/w 18 week [place] Blood type: O/POS/-- (10/13 1115)  Genetic Screen Quad: neg  Antibody:NEG (10/13 1115)  Anatomic [place] single umbilical artery Rubella: 16.30 (10/13 1115)  GTT Early: Third trimester: 112 RPR: NON REAC (10/13 1115)   TDaP vaccine 10/09/14 HBsAg: NEGATIVE (10/13 1115)   Flu vaccine 07/09/14 HIV: NONREACTIVE (10/13 1115)   GBS  GBS:   Contraception mirena Pap: neg; GC/CT neg x 2  Baby Food Breastfeed   Circumcision Declines   [facility]   Support Person Husband         Past Medical History  Diagnosis Date  . Gestational HTN   . Pregnancy induced hypertension   . Chronic hypertension in pregnancy 03/29/2013    Start antenatal testing at 37 weeks.      Past Surgical History  Procedure Laterality Date  . No past surgeries      Family History  Problem Relation Age of Onset  . Hypertension Father   . Alcohol abuse Neg Hx   . Arthritis Neg Hx   . Asthma Neg Hx   . Birth defects Neg Hx   . Cancer Neg Hx   . COPD Neg Hx   . Depression Neg Hx   . Diabetes Neg Hx   . Drug abuse Neg Hx   . Early death Neg Hx   . Hearing loss Neg Hx   . Heart disease Neg Hx   . Hyperlipidemia Neg Hx   . Kidney disease Neg Hx   . Learning disabilities Neg Hx   . Mental illness Neg Hx   . Mental retardation Neg Hx   .  Miscarriages / Stillbirths Neg Hx   . Stroke Neg Hx   . Vision loss Neg Hx     History  Substance Use Topics  . Smoking status: Never Smoker   . Smokeless tobacco: Never Used  . Alcohol Use: No    Allergies: No Known Allergies  Prescriptions prior to admission  Medication Sig Dispense Refill Last Dose  . Prenatal Multivit-Min-Fe-FA (PRENATAL VITAMINS) 0.8 MG tablet Take 1 tablet by mouth daily. 30 tablet 12 11/28/2014 at Unknown time    Review of Systems  Constitutional: Negative for fever and chills.  Eyes: Negative for blurred vision and double vision.  Respiratory: Negative for cough and shortness of breath.   Cardiovascular: Negative for chest pain and palpitations.  Gastrointestinal: Negative for heartburn, nausea, vomiting, diarrhea and constipation.  Genitourinary: Negative for dysuria and hematuria.  Musculoskeletal: Negative for myalgias.  Skin: Negative for rash.  Neurological: Positive for dizziness. Negative for seizures and headaches.   Physical Exam   Blood pressure 140/102, pulse 78, resp. rate 16, last menstrual period 03/21/2014, not currently breastfeeding.   Physical Exam  Constitutional: She is oriented to person, place, and time. She appears well-developed and well-nourished.  HENT:  Head:  Normocephalic and atraumatic.  Eyes: Conjunctivae and EOM are normal. Pupils are equal, round, and reactive to light.  Neck: Normal range of motion. Neck supple.  Cardiovascular: Normal rate, regular rhythm, normal heart sounds and intact distal pulses.   Respiratory: Effort normal and breath sounds normal.  GI: Soft. Bowel sounds are normal.  Musculoskeletal: Normal range of motion.  Neurological: She is alert and oriented to person, place, and time.  Skin: Skin is warm and dry.  Psychiatric: She has a normal mood and affect. Her behavior is normal.    MAU Course  Procedures  Results for orders placed or performed during the hospital encounter of 11/28/14  (from the past 168 hour(s))  Urinalysis, Routine w reflex microscopic   Collection Time: 11/28/14 12:26 PM  Result Value Ref Range   Color, Urine YELLOW YELLOW   APPearance CLEAR CLEAR   Specific Gravity, Urine 1.010 1.005 - 1.030   pH 7.0 5.0 - 8.0   Glucose, UA NEGATIVE NEGATIVE mg/dL   Hgb urine dipstick NEGATIVE NEGATIVE   Bilirubin Urine NEGATIVE NEGATIVE   Ketones, ur NEGATIVE NEGATIVE mg/dL   Protein, ur NEGATIVE NEGATIVE mg/dL   Urobilinogen, UA 0.2 0.0 - 1.0 mg/dL   Nitrite NEGATIVE NEGATIVE   Leukocytes, UA NEGATIVE NEGATIVE  Protein / creatinine ratio, urine   Collection Time: 11/28/14 12:30 PM  Result Value Ref Range   Creatinine, Urine 89.00 mg/dL   Total Protein, Urine 11 mg/dL   Protein Creatinine Ratio 0.12 0.00 - 0.15  Comprehensive metabolic panel   Collection Time: 11/28/14  1:29 PM  Result Value Ref Range   Sodium 137 135 - 145 mmol/L   Potassium 3.5 3.5 - 5.1 mmol/L   Chloride 107 96 - 112 mmol/L   CO2 23 19 - 32 mmol/L   Glucose, Bld 90 70 - 99 mg/dL   BUN 5 (L) 6 - 23 mg/dL   Creatinine, Ser 1.610.51 0.50 - 1.10 mg/dL   Calcium 8.6 8.4 - 09.610.5 mg/dL   Total Protein 6.3 6.0 - 8.3 g/dL   Albumin 3.2 (L) 3.5 - 5.2 g/dL   AST 20 0 - 37 U/L   ALT 20 0 - 35 U/L   Alkaline Phosphatase 100 39 - 117 U/L   Total Bilirubin 0.5 0.3 - 1.2 mg/dL   GFR calc non Af Amer >90 >90 mL/min   GFR calc Af Amer >90 >90 mL/min   Anion gap 7 5 - 15  CBC with Differential/Platelet   Collection Time: 11/28/14  1:29 PM  Result Value Ref Range   WBC 7.2 4.0 - 10.5 K/uL   RBC 4.27 3.87 - 5.11 MIL/uL   Hemoglobin 13.2 12.0 - 15.0 g/dL   HCT 04.537.2 40.936.0 - 81.146.0 %   MCV 87.1 78.0 - 100.0 fL   MCH 30.9 26.0 - 34.0 pg   MCHC 35.5 30.0 - 36.0 g/dL   RDW 91.413.4 78.211.5 - 95.615.5 %   Platelets 230 150 - 400 K/uL   Neutrophils Relative % 59 43 - 77 %   Neutro Abs 4.2 1.7 - 7.7 K/uL   Lymphocytes Relative 24 12 - 46 %   Lymphs Abs 1.7 0.7 - 4.0 K/uL   Monocytes Relative 7 3 - 12 %    Monocytes Absolute 0.5 0.1 - 1.0 K/uL   Eosinophils Relative 10 (H) 0 - 5 %   Eosinophils Absolute 0.8 (H) 0.0 - 0.7 K/uL   Basophils Relative 0 0 - 1 %   Basophils Absolute 0.0  0.0 - 0.1 K/uL  Results for orders placed or performed in visit on 11/28/14 (from the past 168 hour(s))  POCT urinalysis dip (device)   Collection Time: 11/28/14 11:10 AM  Result Value Ref Range   Glucose, UA NEGATIVE NEGATIVE mg/dL   Bilirubin Urine NEGATIVE NEGATIVE   Ketones, ur NEGATIVE NEGATIVE mg/dL   Specific Gravity, Urine 1.010 1.005 - 1.030   Hgb urine dipstick NEGATIVE NEGATIVE   pH 7.0 5.0 - 8.0   Protein, ur NEGATIVE NEGATIVE mg/dL   Urobilinogen, UA 0.2 0.0 - 1.0 mg/dL   Nitrite NEGATIVE NEGATIVE   Leukocytes, UA NEGATIVE NEGATIVE  Culture, beta strep (group b only)   Collection Time: 11/28/14  1:47 PM  Result Value Ref Range   Organism ID, Bacteria GROUP B STREP (S.AGALACTIAE) ISOLATED   Results for orders placed or performed in visit on 11/28/14 (from the past 168 hour(s))  GC/Chlamydia Probe Amp   Collection Time: 11/28/14  1:47 PM  Result Value Ref Range   CT Probe RNA NEGATIVE    GC Probe RNA NEGATIVE      Assessment and Plan  A: 29 y.o. [id number] [redacted]w[redacted]d here for PIH eval     Asymptomatic w/o headaches, vision changes, or LE edema      P: CBC wnl, CMP w/o liver enzyme or creatinine abnormalitis, Prot/Cr ratio wnl, UA w/o protein     Discharged to home after discussion of return to hospital indications  Henson,Amber 11/28/2014, 2:07 PM   I have seen this patient and agree with the above resident's note.  No severe range blood pressures, some normal, some borderline high blood pressures while in MAU.    LEFTWICH-KIRBY, Ida [place]brath Certified Nurse-Midwife

## 2014-11-28 NOTE — Progress Notes (Signed)
Denies symptoms of prex - no headaches, vision changes, or epigastric pain. GBS and GC CT collected.  Olegario MessierKathy RN in MAU notified.  Resident called and given report.  Begin 2xweek testing.

## 2014-11-29 LAB — GC/CHLAMYDIA PROBE AMP
CT PROBE, AMP APTIMA: NEGATIVE
GC Probe RNA: NEGATIVE

## 2014-11-30 LAB — CULTURE, BETA STREP (GROUP B ONLY)

## 2014-12-03 ENCOUNTER — Ambulatory Visit (INDEPENDENT_AMBULATORY_CARE_PROVIDER_SITE_OTHER): Payer: Medicaid Other | Admitting: *Deleted

## 2014-12-03 VITALS — BP 132/93 | HR 73

## 2014-12-03 DIAGNOSIS — O10913 Unspecified pre-existing hypertension complicating pregnancy, third trimester: Secondary | ICD-10-CM

## 2014-12-03 NOTE — Progress Notes (Signed)
Pt denies H/A or visual disturbances today.

## 2014-12-03 NOTE — Progress Notes (Signed)
NST reactive.

## 2014-12-04 ENCOUNTER — Encounter (HOSPITAL_COMMUNITY): Payer: Self-pay

## 2014-12-04 ENCOUNTER — Ambulatory Visit (HOSPITAL_COMMUNITY)
Admission: RE | Admit: 2014-12-04 | Discharge: 2014-12-04 | Disposition: A | Discharge status: Home / Self Care | Payer: Medicaid Other | Source: Ambulatory Visit | Attending: Obstetrics and Gynecology | Admitting: Obstetrics and Gynecology

## 2014-12-04 DIAGNOSIS — O99013 Anemia complicating pregnancy, third trimester: Secondary | ICD-10-CM | POA: Diagnosis not present

## 2014-12-04 DIAGNOSIS — O09293 Supervision of pregnancy with other poor reproductive or obstetric history, third trimester: Secondary | ICD-10-CM | POA: Diagnosis not present

## 2014-12-04 DIAGNOSIS — O10919 Unspecified pre-existing hypertension complicating pregnancy, unspecified trimester: Secondary | ICD-10-CM | POA: Insufficient documentation

## 2014-12-04 DIAGNOSIS — Z3A36 36 weeks gestation of pregnancy: Secondary | ICD-10-CM | POA: Insufficient documentation

## 2014-12-04 DIAGNOSIS — D573 Sickle-cell trait: Secondary | ICD-10-CM | POA: Diagnosis not present

## 2014-12-04 DIAGNOSIS — O10913 Unspecified pre-existing hypertension complicating pregnancy, third trimester: Secondary | ICD-10-CM

## 2014-12-04 DIAGNOSIS — Z36 Encounter for antenatal screening of mother: Secondary | ICD-10-CM | POA: Diagnosis not present

## 2014-12-04 DIAGNOSIS — Q270 Congenital absence and hypoplasia of umbilical artery: Secondary | ICD-10-CM

## 2014-12-04 NOTE — ED Notes (Signed)
Language resource interpreter [name] with pt.

## 2014-12-05 ENCOUNTER — Ambulatory Visit (INDEPENDENT_AMBULATORY_CARE_PROVIDER_SITE_OTHER): Payer: Medicaid Other | Admitting: Family Medicine

## 2014-12-05 VITALS — BP 116/73 | HR 88 | Wt 154.3 lb

## 2014-12-05 DIAGNOSIS — O10913 Unspecified pre-existing hypertension complicating pregnancy, third trimester: Secondary | ICD-10-CM

## 2014-12-05 DIAGNOSIS — O0993 Supervision of high risk pregnancy, unspecified, third trimester: Secondary | ICD-10-CM

## 2014-12-05 DIAGNOSIS — O0992 Supervision of high risk pregnancy, unspecified, second trimester: Secondary | ICD-10-CM

## 2014-12-05 DIAGNOSIS — O09293 Supervision of pregnancy with other poor reproductive or obstetric history, third trimester: Secondary | ICD-10-CM

## 2014-12-05 LAB — POCT URINALYSIS DIP (DEVICE)
Bilirubin Urine: NEGATIVE
GLUCOSE, UA: NEGATIVE mg/dL
Ketones, ur: NEGATIVE mg/dL
Leukocytes, UA: NEGATIVE
Nitrite: NEGATIVE
PH: 7 (ref 5.0–8.0)
Protein, ur: NEGATIVE mg/dL
SPECIFIC GRAVITY, URINE: 1.015 (ref 1.005–1.030)
UROBILINOGEN UA: 0.2 mg/dL (ref 0.0–1.0)

## 2014-12-05 NOTE — Patient Instructions (Signed)
Preeclampsia and Eclampsia Preeclampsia is a serious condition that develops only during pregnancy. It is also called toxemia of pregnancy. This condition causes high blood pressure along with other symptoms, such as swelling and headaches. These may develop as the condition gets worse. Preeclampsia may occur 20 weeks or later into your pregnancy.  Diagnosing and treating preeclampsia early is very important. If not treated early, it can cause serious problems for you and your baby. One problem it can lead to is eclampsia, which is a condition that causes muscle jerking or shaking (convulsions) in the mother. Delivering your baby is the best treatment for preeclampsia or eclampsia.  RISK FACTORS The cause of preeclampsia is not known. You may be more likely to develop preeclampsia if you have certain risk factors. These include:   Being pregnant for the first time.  Having preeclampsia in a past pregnancy.  Having a family history of preeclampsia.  Having high blood pressure.  Being pregnant with twins or triplets.  Being 35 or older.  Being African American.  Having kidney disease or diabetes.  Having medical conditions such as lupus or blood diseases.  Being very overweight (obese). SIGNS AND SYMPTOMS  The earliest signs of preeclampsia are:  High blood pressure.  Increased protein in your urine. Your health care provider will check for this at every prenatal visit. Other symptoms that can develop include:   Severe headaches.  Sudden weight gain.  Swelling of your hands, face, legs, and feet.  Feeling sick to your stomach (nauseous) and throwing up (vomiting).  Vision problems (blurred or double vision).  Numbness in your face, arms, legs, and feet.  Dizziness.  Slurred speech.  Sensitivity to bright lights.  Abdominal pain. DIAGNOSIS  There are no screening tests for preeclampsia. Your health care provider will ask you about symptoms and check for signs of  preeclampsia during your prenatal visits. You may also have tests, including:  Urine testing.  Blood testing.  Checking your baby's heart rate.  Checking the health of your baby and your placenta using images created with sound waves (ultrasound). TREATMENT  You can work out the best treatment approach together with your health care provider. It is very important to keep all prenatal appointments. If you have an increased risk of preeclampsia, you may need more frequent prenatal exams.  Your health care provider may prescribe bed rest.  You may have to eat as little salt as possible.  You may need to take medicine to lower your blood pressure if the condition does not respond to more conservative measures.  You may need to stay in the hospital if your condition is severe. There, treatment will focus on controlling your blood pressure and fluid retention. You may also need to take medicine to prevent seizures.  If the condition gets worse, your baby may need to be delivered early to protect you and the baby. You may have your labor started with medicine (be induced), or you may have a cesarean delivery.  Preeclampsia usually goes away after the baby is born. HOME CARE INSTRUCTIONS   Only take over-the-counter or prescription medicines as directed by your health care provider.  Lie on your left side while resting. This keeps pressure off your baby.  Elevate your feet while resting.  Get regular exercise. Ask your health care provider what type of exercise is safe for you.  Avoid caffeine and alcohol.  Do not smoke.  Drink 6-8 glasses of water every day.  Eat a balanced diet   that is low in salt. Do not add salt to your food.  Avoid stressful situations as much as possible.  Get plenty of rest and sleep.  Keep all prenatal appointments and tests as scheduled. SEEK MEDICAL CARE IF:  You are gaining more weight than expected.  You have any headaches, abdominal pain, or  nausea.  You are bruising more than usual.  You feel dizzy or light-headed. SEEK IMMEDIATE MEDICAL CARE IF:   You develop sudden or severe swelling anywhere in your body. This usually happens in the legs.  You gain 5 lb (2.3 kg) or more in a week.  You have a severe headache, dizziness, problems with your vision, or confusion.  You have severe abdominal pain.  You have lasting nausea or vomiting.  You have a seizure.  You have trouble moving any part of your body.  You develop numbness in your body.  You have trouble speaking.  You have any abnormal bleeding.  You develop a stiff neck.  You pass out. MAKE SURE YOU:   Understand these instructions.  Will watch your condition.  Will get help right away if you are not doing well or get worse. Document Released: 09/10/2000 Document Revised: 09/18/2013 Document Reviewed: 07/06/2013 [employer] Patient Information 2015 [employer]. This information is not intended to replace advice given to you by your health care provider. Make sure you discuss any questions you have with your health care provider.  Third Trimester of Pregnancy The third trimester is from week 29 through week 42, months 7 through 9. The third trimester is a time when the fetus is growing rapidly. At the end of the ninth month, the fetus is about 20 inches in length and weighs 6-10 pounds.  BODY CHANGES Your body goes through many changes during pregnancy. The changes vary from woman to woman.   Your weight will continue to increase. You can expect to gain 25-35 pounds (11-16 kg) by the end of the pregnancy.  You may begin to get stretch marks on your hips, abdomen, and breasts.  You may urinate more often because the fetus is moving lower into your pelvis and pressing on your bladder.  You may develop or continue to have heartburn as a result of your pregnancy.  You may develop constipation because certain hormones are causing the muscles that push  waste through your intestines to slow down.  You may develop hemorrhoids or swollen, bulging veins (varicose veins).  You may have pelvic pain because of the weight gain and pregnancy hormones relaxing your joints between the bones in your pelvis. Backaches may result from overexertion of the muscles supporting your posture.  You may have changes in your hair. These can include thickening of your hair, rapid growth, and changes in texture. Some women also have hair loss during or after pregnancy, or hair that feels dry or thin. Your hair will most likely return to normal after your baby is born.  Your breasts will continue to grow and be tender. A yellow discharge may leak from your breasts called colostrum.  Your belly button may stick out.  You may feel short of breath because of your expanding uterus.  You may notice the fetus "dropping," or moving lower in your abdomen.  You may have a bloody mucus discharge. This usually occurs a few days to a week before labor begins.  Your cervix becomes thin and soft (effaced) near your due date. WHAT TO EXPECT AT YOUR PRENATAL EXAMS  You will have   prenatal exams every 2 weeks until week 36. Then, you will have weekly prenatal exams. During a routine prenatal visit:  You will be weighed to make sure you and the fetus are growing normally.  Your blood pressure is taken.  Your abdomen will be measured to track your baby's growth.  The fetal heartbeat will be listened to.  Any test results from the previous visit will be discussed.  You may have a cervical check near your due date to see if you have effaced. At around 36 weeks, your caregiver will check your cervix. At the same time, your caregiver will also perform a test on the secretions of the vaginal tissue. This test is to determine if a type of bacteria, Group B streptococcus, is present. Your caregiver will explain this further. Your caregiver may ask you:  What your birth plan  is.  How you are feeling.  If you are feeling the baby move.  If you have had any abnormal symptoms, such as leaking fluid, bleeding, severe headaches, or abdominal cramping.  If you have any questions. Other tests or screenings that may be performed during your third trimester include:  Blood tests that check for low iron levels (anemia).  Fetal testing to check the health, activity level, and growth of the fetus. Testing is done if you have certain medical conditions or if there are problems during the pregnancy. FALSE LABOR You may feel small, irregular contractions that eventually go away. These are called Braxton Hicks contractions, or false labor. Contractions may last for hours, days, or even weeks before true labor sets in. If contractions come at regular intervals, intensify, or become painful, it is best to be seen by your caregiver.  SIGNS OF LABOR   Menstrual-like cramps.  Contractions that are 5 minutes apart or less.  Contractions that start on the top of the uterus and spread down to the lower abdomen and back.  A sense of increased pelvic pressure or back pain.  A watery or bloody mucus discharge that comes from the vagina. If you have any of these signs before the 37th week of pregnancy, call your caregiver right away. You need to go to the hospital to get checked immediately. HOME CARE INSTRUCTIONS   Avoid all smoking, herbs, alcohol, and unprescribed drugs. These chemicals affect the formation and growth of the baby.  Follow your caregiver's instructions regarding medicine use. There are medicines that are either safe or unsafe to take during pregnancy.  Exercise only as directed by your caregiver. Experiencing uterine cramps is a good sign to stop exercising.  Continue to eat regular, healthy meals.  Wear a good support bra for breast tenderness.  Do not use hot tubs, steam rooms, or saunas.  Wear your seat belt at all times when driving.  Avoid raw  meat, uncooked cheese, cat litter boxes, and soil used by cats. These carry germs that can cause birth defects in the baby.  Take your prenatal vitamins.  Try taking a stool softener (if your caregiver approves) if you develop constipation. Eat more high-fiber foods, such as fresh vegetables or fruit and whole grains. Drink plenty of fluids to keep your urine clear or pale yellow.  Take warm sitz baths to soothe any pain or discomfort caused by hemorrhoids. Use hemorrhoid cream if your caregiver approves.  If you develop varicose veins, wear support hose. Elevate your feet for 15 minutes, 3-4 times a day. Limit salt in your diet.  Avoid heavy lifting, wear low   heal shoes, and practice good posture.  Rest a lot with your legs elevated if you have leg cramps or low back pain.  Visit your dentist if you have not gone during your pregnancy. Use a soft toothbrush to brush your teeth and be gentle when you floss.  A sexual relationship may be continued unless your caregiver directs you otherwise.  Do not travel far distances unless it is absolutely necessary and only with the approval of your caregiver.  Take prenatal classes to understand, practice, and ask questions about the labor and delivery.  Make a trial run to the hospital.  Pack your hospital bag.  Prepare the baby's nursery.  Continue to go to all your prenatal visits as directed by your caregiver. SEEK MEDICAL CARE IF:  You are unsure if you are in labor or if your water has broken.  You have dizziness.  You have mild pelvic cramps, pelvic pressure, or nagging pain in your abdominal area.  You have persistent nausea, vomiting, or diarrhea.  You have a bad smelling vaginal discharge.  You have pain with urination. SEEK IMMEDIATE MEDICAL CARE IF:   You have a fever.  You are leaking fluid from your vagina.  You have spotting or bleeding from your vagina.  You have severe abdominal cramping or pain.  You have  rapid weight loss or gain.  You have shortness of breath with chest pain.  You notice sudden or extreme swelling of your face, hands, ankles, feet, or legs.  You have not felt your baby move in over an hour.  You have severe headaches that do not go away with medicine.  You have vision changes. Document Released: 09/07/2001 Document Revised: 09/18/2013 Document Reviewed: 11/14/2012 [employer] Patient Information 2015 [employer]. This information is not intended to replace advice given to you by your health care provider. Make sure you discuss any questions you have with your health care provider.  Breastfeeding Deciding to breastfeed is one of the best choices you can make for you and your baby. A change in hormones during pregnancy causes your breast tissue to grow and increases the number and size of your milk ducts. These hormones also allow proteins, sugars, and fats from your blood supply to make breast milk in your milk-producing glands. Hormones prevent breast milk from being released before your baby is born as well as prompt milk flow after birth. Once breastfeeding has begun, thoughts of your baby, as well as his or her sucking or crying, can stimulate the release of milk from your milk-producing glands.  BENEFITS OF BREASTFEEDING For Your Baby  Your first milk (colostrum) helps your baby's digestive system function better.   There are antibodies in your milk that help your baby fight off infections.   Your baby has a lower incidence of asthma, allergies, and sudden infant death syndrome.   The nutrients in breast milk are better for your baby than infant formulas and are designed uniquely for your baby's needs.   Breast milk improves your baby's brain development.   Your baby is less likely to develop other conditions, such as childhood obesity, asthma, or type 2 diabetes mellitus.  For You   Breastfeeding helps to create a very special bond between you and your  baby.   Breastfeeding is convenient. Breast milk is always available at the correct temperature and costs nothing.   Breastfeeding helps to burn calories and helps you lose the weight gained during pregnancy.   Breastfeeding makes your uterus contract   to its prepregnancy size faster and slows bleeding (lochia) after you give birth.   Breastfeeding helps to lower your risk of developing type 2 diabetes mellitus, osteoporosis, and breast or ovarian cancer later in life. SIGNS THAT YOUR BABY IS HUNGRY Early Signs of Hunger  Increased alertness or activity.  Stretching.  Movement of the head from side to side.  Movement of the head and opening of the mouth when the corner of the mouth or cheek is stroked (rooting).  Increased sucking sounds, smacking lips, cooing, sighing, or squeaking.  Hand-to-mouth movements.  Increased sucking of fingers or hands. Late Signs of Hunger  Fussing.  Intermittent crying. Extreme Signs of Hunger Signs of extreme hunger will require calming and consoling before your baby will be able to breastfeed successfully. Do not wait for the following signs of extreme hunger to occur before you initiate breastfeeding:   Restlessness.  A loud, strong cry.   Screaming. BREASTFEEDING BASICS Breastfeeding Initiation  Find a comfortable place to sit or lie down, with your neck and back well supported.  Place a pillow or rolled up blanket under your baby to bring him or her to the level of your breast (if you are seated). Nursing pillows are specially designed to help support your arms and your baby while you breastfeed.  Make sure that your baby's abdomen is facing your abdomen.   Gently massage your breast. With your fingertips, massage from your chest wall toward your nipple in a circular motion. This encourages milk flow. You may need to continue this action during the feeding if your milk flows slowly.  Support your breast with 4 fingers  underneath and your thumb above your nipple. Make sure your fingers are well away from your nipple and your baby's mouth.   Stroke your baby's lips gently with your finger or nipple.   When your baby's mouth is open wide enough, quickly bring your baby to your breast, placing your entire nipple and as much of the colored area around your nipple (areola) as possible into your baby's mouth.   More areola should be visible above your baby's upper lip than below the lower lip.   Your baby's tongue should be between his or her lower gum and your breast.   Ensure that your baby's mouth is correctly positioned around your nipple (latched). Your baby's lips should create a seal on your breast and be turned out (everted).  It is common for your baby to suck about 2-3 minutes in order to start the flow of breast milk. Latching Teaching your baby how to latch on to your breast properly is very important. An improper latch can cause nipple pain and decreased milk supply for you and poor weight gain in your baby. Also, if your baby is not latched onto your nipple properly, he or she may swallow some air during feeding. This can make your baby fussy. Burping your baby when you switch breasts during the feeding can help to get rid of the air. However, teaching your baby to latch on properly is still the best way to prevent fussiness from swallowing air while breastfeeding. Signs that your baby has successfully latched on to your nipple:    Silent tugging or silent sucking, without causing you pain.   Swallowing heard between every 3-4 sucks.    Muscle movement above and in front of his or her ears while sucking.  Signs that your baby has not successfully latched on to nipple:   Sucking sounds   or smacking sounds from your baby while breastfeeding.  Nipple pain. If you think your baby has not latched on correctly, slip your finger into the corner of your baby's mouth to break the suction and place  it between your baby's gums. Attempt breastfeeding initiation again. Signs of Successful Breastfeeding Signs from your baby:   A gradual decrease in the number of sucks or complete cessation of sucking.   Falling asleep.   Relaxation of his or her body.   Retention of a small amount of milk in his or her mouth.   Letting go of your breast by himself or herself. Signs from you:  Breasts that have increased in firmness, weight, and size 1-3 hours after feeding.   Breasts that are softer immediately after breastfeeding.  Increased milk volume, as well as a change in milk consistency and color by the fifth day of breastfeeding.   Nipples that are not sore, cracked, or bleeding. Signs That Your Baby is Getting Enough Milk  Wetting at least 3 diapers in a 24-hour period. The urine should be clear and pale yellow by age 5 days.  At least 3 stools in a 24-hour period by age 5 days. The stool should be soft and yellow.  At least 3 stools in a 24-hour period by age 7 days. The stool should be seedy and yellow.  No loss of weight greater than 10% of birth weight during the first 3 days of age.  Average weight gain of 4-7 ounces (113-198 g) per week after age 4 days.  Consistent daily weight gain by age 5 days, without weight loss after the age of 2 weeks. After a feeding, your baby may spit up a small amount. This is common. BREASTFEEDING FREQUENCY AND DURATION Frequent feeding will help you make more milk and can prevent sore nipples and breast engorgement. Breastfeed when you feel the need to reduce the fullness of your breasts or when your baby shows signs of hunger. This is called "breastfeeding on demand." Avoid introducing a pacifier to your baby while you are working to establish breastfeeding (the first 4-6 weeks after your baby is born). After this time you may choose to use a pacifier. Research has shown that pacifier use during the first year of a baby's life decreases the  risk of sudden infant death syndrome (SIDS). Allow your baby to feed on each breast as long as he or she wants. Breastfeed until your baby is finished feeding. When your baby unlatches or falls asleep while feeding from the first breast, offer the second breast. Because newborns are often sleepy in the first few weeks of life, you may need to awaken your baby to get him or her to feed. Breastfeeding times will vary from baby to baby. However, the following rules can serve as a guide to help you ensure that your baby is properly fed:  Newborns (babies 4 weeks of age or younger) may breastfeed every 1-3 hours.  Newborns should not go longer than 3 hours during the day or 5 hours during the night without breastfeeding.  You should breastfeed your baby a minimum of 8 times in a 24-hour period until you begin to introduce solid foods to your baby at around 6 months of age. BREAST MILK PUMPING Pumping and storing breast milk allows you to ensure that your baby is exclusively fed your breast milk, even at times when you are unable to breastfeed. This is especially important if you are going back to work   while you are still breastfeeding or when you are not able to be present during feedings. Your lactation consultant can give you guidelines on how long it is safe to store breast milk.  A breast pump is a machine that allows you to pump milk from your breast into a sterile bottle. The pumped breast milk can then be stored in a refrigerator or freezer. Some breast pumps are operated by hand, while others use electricity. Ask your lactation consultant which type will work best for you. Breast pumps can be purchased, but some hospitals and breastfeeding support groups lease breast pumps on a monthly basis. A lactation consultant can teach you how to hand express breast milk, if you prefer not to use a pump.  CARING FOR YOUR BREASTS WHILE YOU BREASTFEED Nipples can become dry, cracked, and sore while breastfeeding.  The following recommendations can help keep your breasts moisturized and healthy:  Avoid using soap on your nipples.   Wear a supportive bra. Although not required, special nursing bras and tank tops are designed to allow access to your breasts for breastfeeding without taking off your entire bra or top. Avoid wearing underwire-style bras or extremely tight bras.  Air dry your nipples for 3-4minutes after each feeding.   Use only cotton bra pads to absorb leaked breast milk. Leaking of breast milk between feedings is normal.   Use lanolin on your nipples after breastfeeding. Lanolin helps to maintain your skin's normal moisture barrier. If you use pure lanolin, you do not need to wash it off before feeding your baby again. Pure lanolin is not toxic to your baby. You may also hand express a few drops of breast milk and gently massage that milk into your nipples and allow the milk to air dry. In the first few weeks after giving birth, some women experience extremely full breasts (engorgement). Engorgement can make your breasts feel heavy, warm, and tender to the touch. Engorgement peaks within 3-5 days after you give birth. The following recommendations can help ease engorgement:  Completely empty your breasts while breastfeeding or pumping. You may want to start by applying warm, moist heat (in the shower or with warm water-soaked hand towels) just before feeding or pumping. This increases circulation and helps the milk flow. If your baby does not completely empty your breasts while breastfeeding, pump any extra milk after he or she is finished.  Wear a snug bra (nursing or regular) or tank top for 1-2 days to signal your body to slightly decrease milk production.  Apply ice packs to your breasts, unless this is too uncomfortable for you.  Make sure that your baby is latched on and positioned properly while breastfeeding. If engorgement persists after 48 hours of following these  recommendations, contact your health care provider or a lactation consultant. OVERALL HEALTH CARE RECOMMENDATIONS WHILE BREASTFEEDING  Eat healthy foods. Alternate between meals and snacks, eating 3 of each per day. Because what you eat affects your breast milk, some of the foods may make your baby more irritable than usual. Avoid eating these foods if you are sure that they are negatively affecting your baby.  Drink milk, fruit juice, and water to satisfy your thirst (about 10 glasses a day).   Rest often, relax, and continue to take your prenatal vitamins to prevent fatigue, stress, and anemia.  Continue breast self-awareness checks.  Avoid chewing and smoking tobacco.  Avoid alcohol and drug use. Some medicines that may be harmful to your baby can pass through   breast milk. It is important to ask your health care provider before taking any medicine, including all over-the-counter and prescription medicine as well as vitamin and herbal supplements. It is possible to become pregnant while breastfeeding. If birth control is desired, ask your health care provider about options that will be safe for your baby. SEEK MEDICAL CARE IF:   You feel like you want to stop breastfeeding or have become frustrated with breastfeeding.  You have painful breasts or nipples.  Your nipples are cracked or bleeding.  Your breasts are red, tender, or warm.  You have a swollen area on either breast.  You have a fever or chills.  You have nausea or vomiting.  You have drainage other than breast milk from your nipples.  Your breasts do not become full before feedings by the fifth day after you give birth.  You feel sad and depressed.  Your baby is too sleepy to eat well.  Your baby is having trouble sleeping.   Your baby is wetting less than 3 diapers in a 24-hour period.  Your baby has less than 3 stools in a 24-hour period.  Your baby's skin or the white part of his or her eyes becomes  yellow.   Your baby is not gaining weight by 5 days of age. SEEK IMMEDIATE MEDICAL CARE IF:   Your baby is overly tired (lethargic) and does not want to wake up and feed.  Your baby develops an unexplained fever. Document Released: 09/13/2005 Document Revised: 09/18/2013 Document Reviewed: 03/07/2013 ExitCare Patient Information 2015 [employer]. This information is not intended to replace advice given to you by your health care provider. Make sure you discuss any questions you have with your health care provider.  

## 2014-12-05 NOTE — Progress Notes (Signed)
Some elevated BP's but then returns to normal. In 2x/wk testing--growth WNL at 20% Plan for IOL at 39 wks, or sooner with persistently elevated BP or new onset proteinuria. NST reviewed and reactive.

## 2014-12-05 NOTE — Progress Notes (Signed)
IOL scheduled

## 2014-12-05 NOTE — Progress Notes (Signed)
Used interpreter Redgie GrayerJoyce Njoroge.Bp elevated while baby crying/screaming. Rechecked bp once baby settled- normal.

## 2014-12-09 ENCOUNTER — Ambulatory Visit (INDEPENDENT_AMBULATORY_CARE_PROVIDER_SITE_OTHER): Payer: Medicaid Other | Admitting: Family Medicine

## 2014-12-09 ENCOUNTER — Encounter: Payer: Self-pay | Admitting: Family Medicine

## 2014-12-09 VITALS — BP 124/83 | HR 93 | Temp 98.2°F | Wt 155.0 lb

## 2014-12-09 DIAGNOSIS — O0992 Supervision of high risk pregnancy, unspecified, second trimester: Secondary | ICD-10-CM

## 2014-12-09 DIAGNOSIS — O10913 Unspecified pre-existing hypertension complicating pregnancy, third trimester: Secondary | ICD-10-CM

## 2014-12-09 DIAGNOSIS — O99820 Streptococcus B carrier state complicating pregnancy: Secondary | ICD-10-CM | POA: Insufficient documentation

## 2014-12-09 DIAGNOSIS — Z22330 Carrier of Group B streptococcus: Secondary | ICD-10-CM

## 2014-12-09 LAB — POCT URINALYSIS DIP (DEVICE)
Bilirubin Urine: NEGATIVE
GLUCOSE, UA: NEGATIVE mg/dL
Hgb urine dipstick: NEGATIVE
Ketones, ur: NEGATIVE mg/dL
LEUKOCYTES UA: NEGATIVE
NITRITE: NEGATIVE
Protein, ur: NEGATIVE mg/dL
Specific Gravity, Urine: 1.015 (ref 1.005–1.030)
UROBILINOGEN UA: 0.2 mg/dL (ref 0.0–1.0)
pH: 6 (ref 5.0–8.0)

## 2014-12-09 NOTE — Progress Notes (Signed)
C/o pelvic pressure and pain upper thighs.  US for growth done 3/9.

## 2014-12-09 NOTE — Patient Instructions (Signed)
Preeclampsia and Eclampsia Preeclampsia is a serious condition that develops only during pregnancy. It is also called toxemia of pregnancy. This condition causes high blood pressure along with other symptoms, such as swelling and headaches. These may develop as the condition gets worse. Preeclampsia may occur 20 weeks or later into your pregnancy.  Diagnosing and treating preeclampsia early is very important. If not treated early, it can cause serious problems for you and your baby. One problem it can lead to is eclampsia, which is a condition that causes muscle jerking or shaking (convulsions) in the mother. Delivering your baby is the best treatment for preeclampsia or eclampsia.  RISK FACTORS The cause of preeclampsia is not known. You may be more likely to develop preeclampsia if you have certain risk factors. These include:   Being pregnant for the first time.  Having preeclampsia in a past pregnancy.  Having a family history of preeclampsia.  Having high blood pressure.  Being pregnant with twins or triplets.  Being 35 or older.  Being African American.  Having kidney disease or diabetes.  Having medical conditions such as lupus or blood diseases.  Being very overweight (obese). SIGNS AND SYMPTOMS  The earliest signs of preeclampsia are:  High blood pressure.  Increased protein in your urine. Your health care provider will check for this at every prenatal visit. Other symptoms that can develop include:   Severe headaches.  Sudden weight gain.  Swelling of your hands, face, legs, and feet.  Feeling sick to your stomach (nauseous) and throwing up (vomiting).  Vision problems (blurred or double vision).  Numbness in your face, arms, legs, and feet.  Dizziness.  Slurred speech.  Sensitivity to bright lights.  Abdominal pain. DIAGNOSIS  There are no screening tests for preeclampsia. Your health care provider will ask you about symptoms and check for signs of  preeclampsia during your prenatal visits. You may also have tests, including:  Urine testing.  Blood testing.  Checking your baby's heart rate.  Checking the health of your baby and your placenta using images created with sound waves (ultrasound). TREATMENT  You can work out the best treatment approach together with your health care provider. It is very important to keep all prenatal appointments. If you have an increased risk of preeclampsia, you may need more frequent prenatal exams.  Your health care provider may prescribe bed rest.  You may have to eat as little salt as possible.  You may need to take medicine to lower your blood pressure if the condition does not respond to more conservative measures.  You may need to stay in the hospital if your condition is severe. There, treatment will focus on controlling your blood pressure and fluid retention. You may also need to take medicine to prevent seizures.  If the condition gets worse, your baby may need to be delivered early to protect you and the baby. You may have your labor started with medicine (be induced), or you may have a cesarean delivery.  Preeclampsia usually goes away after the baby is born. HOME CARE INSTRUCTIONS   Only take over-the-counter or prescription medicines as directed by your health care provider.  Lie on your left side while resting. This keeps pressure off your baby.  Elevate your feet while resting.  Get regular exercise. Ask your health care provider what type of exercise is safe for you.  Avoid caffeine and alcohol.  Do not smoke.  Drink 6-8 glasses of water every day.  Eat a balanced diet   that is low in salt. Do not add salt to your food.  Avoid stressful situations as much as possible.  Get plenty of rest and sleep.  Keep all prenatal appointments and tests as scheduled. SEEK MEDICAL CARE IF:  You are gaining more weight than expected.  You have any headaches, abdominal pain, or  nausea.  You are bruising more than usual.  You feel dizzy or light-headed. SEEK IMMEDIATE MEDICAL CARE IF:   You develop sudden or severe swelling anywhere in your body. This usually happens in the legs.  You gain 5 lb (2.3 kg) or more in a week.  You have a severe headache, dizziness, problems with your vision, or confusion.  You have severe abdominal pain.  You have lasting nausea or vomiting.  You have a seizure.  You have trouble moving any part of your body.  You develop numbness in your body.  You have trouble speaking.  You have any abnormal bleeding.  You develop a stiff neck.  You pass out. MAKE SURE YOU:   Understand these instructions.  Will watch your condition.  Will get help right away if you are not doing well or get worse. Document Released: 09/10/2000 Document Revised: 09/18/2013 Document Reviewed: 07/06/2013 [employer] Patient Information 2015 [employer]. This information is not intended to replace advice given to you by your health care provider. Make sure you discuss any questions you have with your health care provider.  Third Trimester of Pregnancy The third trimester is from week 29 through week 42, months 7 through 9. The third trimester is a time when the fetus is growing rapidly. At the end of the ninth month, the fetus is about 20 inches in length and weighs 6-10 pounds.  BODY CHANGES Your body goes through many changes during pregnancy. The changes vary from woman to woman.   Your weight will continue to increase. You can expect to gain 25-35 pounds (11-16 kg) by the end of the pregnancy.  You may begin to get stretch marks on your hips, abdomen, and breasts.  You may urinate more often because the fetus is moving lower into your pelvis and pressing on your bladder.  You may develop or continue to have heartburn as a result of your pregnancy.  You may develop constipation because certain hormones are causing the muscles that push  waste through your intestines to slow down.  You may develop hemorrhoids or swollen, bulging veins (varicose veins).  You may have pelvic pain because of the weight gain and pregnancy hormones relaxing your joints between the bones in your pelvis. Backaches may result from overexertion of the muscles supporting your posture.  You may have changes in your hair. These can include thickening of your hair, rapid growth, and changes in texture. Some women also have hair loss during or after pregnancy, or hair that feels dry or thin. Your hair will most likely return to normal after your baby is born.  Your breasts will continue to grow and be tender. A yellow discharge may leak from your breasts called colostrum.  Your belly button may stick out.  You may feel short of breath because of your expanding uterus.  You may notice the fetus "dropping," or moving lower in your abdomen.  You may have a bloody mucus discharge. This usually occurs a few days to a week before labor begins.  Your cervix becomes thin and soft (effaced) near your due date. WHAT TO EXPECT AT YOUR PRENATAL EXAMS  You will have   prenatal exams every 2 weeks until week 36. Then, you will have weekly prenatal exams. During a routine prenatal visit:  You will be weighed to make sure you and the fetus are growing normally.  Your blood pressure is taken.  Your abdomen will be measured to track your baby's growth.  The fetal heartbeat will be listened to.  Any test results from the previous visit will be discussed.  You may have a cervical check near your due date to see if you have effaced. At around 36 weeks, your caregiver will check your cervix. At the same time, your caregiver will also perform a test on the secretions of the vaginal tissue. This test is to determine if a type of bacteria, Group B streptococcus, is present. Your caregiver will explain this further. Your caregiver may ask you:  What your birth plan  is.  How you are feeling.  If you are feeling the baby move.  If you have had any abnormal symptoms, such as leaking fluid, bleeding, severe headaches, or abdominal cramping.  If you have any questions. Other tests or screenings that may be performed during your third trimester include:  Blood tests that check for low iron levels (anemia).  Fetal testing to check the health, activity level, and growth of the fetus. Testing is done if you have certain medical conditions or if there are problems during the pregnancy. FALSE LABOR You may feel small, irregular contractions that eventually go away. These are called Braxton Hicks contractions, or false labor. Contractions may last for hours, days, or even weeks before true labor sets in. If contractions come at regular intervals, intensify, or become painful, it is best to be seen by your caregiver.  SIGNS OF LABOR   Menstrual-like cramps.  Contractions that are 5 minutes apart or less.  Contractions that start on the top of the uterus and spread down to the lower abdomen and back.  A sense of increased pelvic pressure or back pain.  A watery or bloody mucus discharge that comes from the vagina. If you have any of these signs before the 37th week of pregnancy, call your caregiver right away. You need to go to the hospital to get checked immediately. HOME CARE INSTRUCTIONS   Avoid all smoking, herbs, alcohol, and unprescribed drugs. These chemicals affect the formation and growth of the baby.  Follow your caregiver's instructions regarding medicine use. There are medicines that are either safe or unsafe to take during pregnancy.  Exercise only as directed by your caregiver. Experiencing uterine cramps is a good sign to stop exercising.  Continue to eat regular, healthy meals.  Wear a good support bra for breast tenderness.  Do not use hot tubs, steam rooms, or saunas.  Wear your seat belt at all times when driving.  Avoid raw  meat, uncooked cheese, cat litter boxes, and soil used by cats. These carry germs that can cause birth defects in the baby.  Take your prenatal vitamins.  Try taking a stool softener (if your caregiver approves) if you develop constipation. Eat more high-fiber foods, such as fresh vegetables or fruit and whole grains. Drink plenty of fluids to keep your urine clear or pale yellow.  Take warm sitz baths to soothe any pain or discomfort caused by hemorrhoids. Use hemorrhoid cream if your caregiver approves.  If you develop varicose veins, wear support hose. Elevate your feet for 15 minutes, 3-4 times a day. Limit salt in your diet.  Avoid heavy lifting, wear low   heal shoes, and practice good posture.  Rest a lot with your legs elevated if you have leg cramps or low back pain.  Visit your dentist if you have not gone during your pregnancy. Use a soft toothbrush to brush your teeth and be gentle when you floss.  A sexual relationship may be continued unless your caregiver directs you otherwise.  Do not travel far distances unless it is absolutely necessary and only with the approval of your caregiver.  Take prenatal classes to understand, practice, and ask questions about the labor and delivery.  Make a trial run to the hospital.  Pack your hospital bag.  Prepare the baby's nursery.  Continue to go to all your prenatal visits as directed by your caregiver. SEEK MEDICAL CARE IF:  You are unsure if you are in labor or if your water has broken.  You have dizziness.  You have mild pelvic cramps, pelvic pressure, or nagging pain in your abdominal area.  You have persistent nausea, vomiting, or diarrhea.  You have a bad smelling vaginal discharge.  You have pain with urination. SEEK IMMEDIATE MEDICAL CARE IF:   You have a fever.  You are leaking fluid from your vagina.  You have spotting or bleeding from your vagina.  You have severe abdominal cramping or pain.  You have  rapid weight loss or gain.  You have shortness of breath with chest pain.  You notice sudden or extreme swelling of your face, hands, ankles, feet, or legs.  You have not felt your baby move in over an hour.  You have severe headaches that do not go away with medicine.  You have vision changes. Document Released: 09/07/2001 Document Revised: 09/18/2013 Document Reviewed: 11/14/2012 [employer] Patient Information 2015 [employer]. This information is not intended to replace advice given to you by your health care provider. Make sure you discuss any questions you have with your health care provider.  Breastfeeding Deciding to breastfeed is one of the best choices you can make for you and your baby. A change in hormones during pregnancy causes your breast tissue to grow and increases the number and size of your milk ducts. These hormones also allow proteins, sugars, and fats from your blood supply to make breast milk in your milk-producing glands. Hormones prevent breast milk from being released before your baby is born as well as prompt milk flow after birth. Once breastfeeding has begun, thoughts of your baby, as well as his or her sucking or crying, can stimulate the release of milk from your milk-producing glands.  BENEFITS OF BREASTFEEDING For Your Baby  Your first milk (colostrum) helps your baby's digestive system function better.   There are antibodies in your milk that help your baby fight off infections.   Your baby has a lower incidence of asthma, allergies, and sudden infant death syndrome.   The nutrients in breast milk are better for your baby than infant formulas and are designed uniquely for your baby's needs.   Breast milk improves your baby's brain development.   Your baby is less likely to develop other conditions, such as childhood obesity, asthma, or type 2 diabetes mellitus.  For You   Breastfeeding helps to create a very special bond between you and your  baby.   Breastfeeding is convenient. Breast milk is always available at the correct temperature and costs nothing.   Breastfeeding helps to burn calories and helps you lose the weight gained during pregnancy.   Breastfeeding makes your uterus contract   to its prepregnancy size faster and slows bleeding (lochia) after you give birth.   Breastfeeding helps to lower your risk of developing type 2 diabetes mellitus, osteoporosis, and breast or ovarian cancer later in life. SIGNS THAT YOUR BABY IS HUNGRY Early Signs of Hunger  Increased alertness or activity.  Stretching.  Movement of the head from side to side.  Movement of the head and opening of the mouth when the corner of the mouth or cheek is stroked (rooting).  Increased sucking sounds, smacking lips, cooing, sighing, or squeaking.  Hand-to-mouth movements.  Increased sucking of fingers or hands. Late Signs of Hunger  Fussing.  Intermittent crying. Extreme Signs of Hunger Signs of extreme hunger will require calming and consoling before your baby will be able to breastfeed successfully. Do not wait for the following signs of extreme hunger to occur before you initiate breastfeeding:   Restlessness.  A loud, strong cry.   Screaming. BREASTFEEDING BASICS Breastfeeding Initiation  Find a comfortable place to sit or lie down, with your neck and back well supported.  Place a pillow or rolled up blanket under your baby to bring him or her to the level of your breast (if you are seated). Nursing pillows are specially designed to help support your arms and your baby while you breastfeed.  Make sure that your baby's abdomen is facing your abdomen.   Gently massage your breast. With your fingertips, massage from your chest wall toward your nipple in a circular motion. This encourages milk flow. You may need to continue this action during the feeding if your milk flows slowly.  Support your breast with 4 fingers  underneath and your thumb above your nipple. Make sure your fingers are well away from your nipple and your baby's mouth.   Stroke your baby's lips gently with your finger or nipple.   When your baby's mouth is open wide enough, quickly bring your baby to your breast, placing your entire nipple and as much of the colored area around your nipple (areola) as possible into your baby's mouth.   More areola should be visible above your baby's upper lip than below the lower lip.   Your baby's tongue should be between his or her lower gum and your breast.   Ensure that your baby's mouth is correctly positioned around your nipple (latched). Your baby's lips should create a seal on your breast and be turned out (everted).  It is common for your baby to suck about 2-3 minutes in order to start the flow of breast milk. Latching Teaching your baby how to latch on to your breast properly is very important. An improper latch can cause nipple pain and decreased milk supply for you and poor weight gain in your baby. Also, if your baby is not latched onto your nipple properly, he or she may swallow some air during feeding. This can make your baby fussy. Burping your baby when you switch breasts during the feeding can help to get rid of the air. However, teaching your baby to latch on properly is still the best way to prevent fussiness from swallowing air while breastfeeding. Signs that your baby has successfully latched on to your nipple:    Silent tugging or silent sucking, without causing you pain.   Swallowing heard between every 3-4 sucks.    Muscle movement above and in front of his or her ears while sucking.  Signs that your baby has not successfully latched on to nipple:   Sucking sounds   or smacking sounds from your baby while breastfeeding.  Nipple pain. If you think your baby has not latched on correctly, slip your finger into the corner of your baby's mouth to break the suction and place  it between your baby's gums. Attempt breastfeeding initiation again. Signs of Successful Breastfeeding Signs from your baby:   A gradual decrease in the number of sucks or complete cessation of sucking.   Falling asleep.   Relaxation of his or her body.   Retention of a small amount of milk in his or her mouth.   Letting go of your breast by himself or herself. Signs from you:  Breasts that have increased in firmness, weight, and size 1-3 hours after feeding.   Breasts that are softer immediately after breastfeeding.  Increased milk volume, as well as a change in milk consistency and color by the fifth day of breastfeeding.   Nipples that are not sore, cracked, or bleeding. Signs That Your Baby is Getting Enough Milk  Wetting at least 3 diapers in a 24-hour period. The urine should be clear and pale yellow by age 5 days.  At least 3 stools in a 24-hour period by age 5 days. The stool should be soft and yellow.  At least 3 stools in a 24-hour period by age 7 days. The stool should be seedy and yellow.  No loss of weight greater than 10% of birth weight during the first 3 days of age.  Average weight gain of 4-7 ounces (113-198 g) per week after age 4 days.  Consistent daily weight gain by age 5 days, without weight loss after the age of 2 weeks. After a feeding, your baby may spit up a small amount. This is common. BREASTFEEDING FREQUENCY AND DURATION Frequent feeding will help you make more milk and can prevent sore nipples and breast engorgement. Breastfeed when you feel the need to reduce the fullness of your breasts or when your baby shows signs of hunger. This is called "breastfeeding on demand." Avoid introducing a pacifier to your baby while you are working to establish breastfeeding (the first 4-6 weeks after your baby is born). After this time you may choose to use a pacifier. Research has shown that pacifier use during the first year of a baby's life decreases the  risk of sudden infant death syndrome (SIDS). Allow your baby to feed on each breast as long as he or she wants. Breastfeed until your baby is finished feeding. When your baby unlatches or falls asleep while feeding from the first breast, offer the second breast. Because newborns are often sleepy in the first few weeks of life, you may need to awaken your baby to get him or her to feed. Breastfeeding times will vary from baby to baby. However, the following rules can serve as a guide to help you ensure that your baby is properly fed:  Newborns (babies 4 weeks of age or younger) may breastfeed every 1-3 hours.  Newborns should not go longer than 3 hours during the day or 5 hours during the night without breastfeeding.  You should breastfeed your baby a minimum of 8 times in a 24-hour period until you begin to introduce solid foods to your baby at around 6 months of age. BREAST MILK PUMPING Pumping and storing breast milk allows you to ensure that your baby is exclusively fed your breast milk, even at times when you are unable to breastfeed. This is especially important if you are going back to work   while you are still breastfeeding or when you are not able to be present during feedings. Your lactation consultant can give you guidelines on how long it is safe to store breast milk.  A breast pump is a machine that allows you to pump milk from your breast into a sterile bottle. The pumped breast milk can then be stored in a refrigerator or freezer. Some breast pumps are operated by hand, while others use electricity. Ask your lactation consultant which type will work best for you. Breast pumps can be purchased, but some hospitals and breastfeeding support groups lease breast pumps on a monthly basis. A lactation consultant can teach you how to hand express breast milk, if you prefer not to use a pump.  CARING FOR YOUR BREASTS WHILE YOU BREASTFEED Nipples can become dry, cracked, and sore while breastfeeding.  The following recommendations can help keep your breasts moisturized and healthy:  Avoid using soap on your nipples.   Wear a supportive bra. Although not required, special nursing bras and tank tops are designed to allow access to your breasts for breastfeeding without taking off your entire bra or top. Avoid wearing underwire-style bras or extremely tight bras.  Air dry your nipples for 3-4minutes after each feeding.   Use only cotton bra pads to absorb leaked breast milk. Leaking of breast milk between feedings is normal.   Use lanolin on your nipples after breastfeeding. Lanolin helps to maintain your skin's normal moisture barrier. If you use pure lanolin, you do not need to wash it off before feeding your baby again. Pure lanolin is not toxic to your baby. You may also hand express a few drops of breast milk and gently massage that milk into your nipples and allow the milk to air dry. In the first few weeks after giving birth, some women experience extremely full breasts (engorgement). Engorgement can make your breasts feel heavy, warm, and tender to the touch. Engorgement peaks within 3-5 days after you give birth. The following recommendations can help ease engorgement:  Completely empty your breasts while breastfeeding or pumping. You may want to start by applying warm, moist heat (in the shower or with warm water-soaked hand towels) just before feeding or pumping. This increases circulation and helps the milk flow. If your baby does not completely empty your breasts while breastfeeding, pump any extra milk after he or she is finished.  Wear a snug bra (nursing or regular) or tank top for 1-2 days to signal your body to slightly decrease milk production.  Apply ice packs to your breasts, unless this is too uncomfortable for you.  Make sure that your baby is latched on and positioned properly while breastfeeding. If engorgement persists after 48 hours of following these  recommendations, contact your health care provider or a lactation consultant. OVERALL HEALTH CARE RECOMMENDATIONS WHILE BREASTFEEDING  Eat healthy foods. Alternate between meals and snacks, eating 3 of each per day. Because what you eat affects your breast milk, some of the foods may make your baby more irritable than usual. Avoid eating these foods if you are sure that they are negatively affecting your baby.  Drink milk, fruit juice, and water to satisfy your thirst (about 10 glasses a day).   Rest often, relax, and continue to take your prenatal vitamins to prevent fatigue, stress, and anemia.  Continue breast self-awareness checks.  Avoid chewing and smoking tobacco.  Avoid alcohol and drug use. Some medicines that may be harmful to your baby can pass through   breast milk. It is important to ask your health care provider before taking any medicine, including all over-the-counter and prescription medicine as well as vitamin and herbal supplements. It is possible to become pregnant while breastfeeding. If birth control is desired, ask your health care provider about options that will be safe for your baby. SEEK MEDICAL CARE IF:   You feel like you want to stop breastfeeding or have become frustrated with breastfeeding.  You have painful breasts or nipples.  Your nipples are cracked or bleeding.  Your breasts are red, tender, or warm.  You have a swollen area on either breast.  You have a fever or chills.  You have nausea or vomiting.  You have drainage other than breast milk from your nipples.  Your breasts do not become full before feedings by the fifth day after you give birth.  You feel sad and depressed.  Your baby is too sleepy to eat well.  Your baby is having trouble sleeping.   Your baby is wetting less than 3 diapers in a 24-hour period.  Your baby has less than 3 stools in a 24-hour period.  Your baby's skin or the white part of his or her eyes becomes  yellow.   Your baby is not gaining weight by 5 days of age. SEEK IMMEDIATE MEDICAL CARE IF:   Your baby is overly tired (lethargic) and does not want to wake up and feed.  Your baby develops an unexplained fever. Document Released: 09/13/2005 Document Revised: 09/18/2013 Document Reviewed: 03/07/2013 ExitCare Patient Information 2015 [employer]. This information is not intended to replace advice given to you by your health care provider. Make sure you discuss any questions you have with your health care provider.  

## 2014-12-09 NOTE — Progress Notes (Signed)
Already scheduled for IOL NST reviewed and reactive.

## 2014-12-10 ENCOUNTER — Other Ambulatory Visit: Payer: Medicaid Other

## 2014-12-11 ENCOUNTER — Telehealth (HOSPITAL_COMMUNITY): Payer: Self-pay | Admitting: *Deleted

## 2014-12-11 NOTE — Telephone Encounter (Signed)
Preadmission screen  

## 2014-12-12 ENCOUNTER — Ambulatory Visit (INDEPENDENT_AMBULATORY_CARE_PROVIDER_SITE_OTHER): Payer: Medicaid Other | Admitting: Family Medicine

## 2014-12-12 VITALS — BP 140/88 | HR 73 | Temp 97.3°F | Wt 154.8 lb

## 2014-12-12 DIAGNOSIS — O10913 Unspecified pre-existing hypertension complicating pregnancy, third trimester: Secondary | ICD-10-CM

## 2014-12-12 DIAGNOSIS — O0992 Supervision of high risk pregnancy, unspecified, second trimester: Secondary | ICD-10-CM

## 2014-12-12 DIAGNOSIS — O0993 Supervision of high risk pregnancy, unspecified, third trimester: Secondary | ICD-10-CM

## 2014-12-12 LAB — US OB FOLLOW UP

## 2014-12-12 LAB — POCT URINALYSIS DIP (DEVICE)
Bilirubin Urine: NEGATIVE
GLUCOSE, UA: NEGATIVE mg/dL
Hgb urine dipstick: NEGATIVE
Ketones, ur: NEGATIVE mg/dL
Leukocytes, UA: NEGATIVE
Nitrite: NEGATIVE
PH: 5.5 (ref 5.0–8.0)
Protein, ur: NEGATIVE mg/dL
SPECIFIC GRAVITY, URINE: 1.015 (ref 1.005–1.030)
Urobilinogen, UA: 0.2 mg/dL (ref 0.0–1.0)

## 2014-12-12 NOTE — Progress Notes (Signed)
OBF/NST/AFI Carrie Duncan as Interpreter for encounter

## 2014-12-12 NOTE — Progress Notes (Signed)
NST reactive.  No questions. [facility]

## 2014-12-16 ENCOUNTER — Ambulatory Visit (INDEPENDENT_AMBULATORY_CARE_PROVIDER_SITE_OTHER): Payer: Medicaid Other | Admitting: Obstetrics & Gynecology

## 2014-12-16 VITALS — BP 136/90 | HR 90 | Temp 98.2°F | Wt 155.9 lb

## 2014-12-16 DIAGNOSIS — O10913 Unspecified pre-existing hypertension complicating pregnancy, third trimester: Secondary | ICD-10-CM

## 2014-12-16 DIAGNOSIS — O0993 Supervision of high risk pregnancy, unspecified, third trimester: Secondary | ICD-10-CM

## 2014-12-16 LAB — POCT URINALYSIS DIP (DEVICE)
Bilirubin Urine: NEGATIVE
GLUCOSE, UA: NEGATIVE mg/dL
HGB URINE DIPSTICK: NEGATIVE
Ketones, ur: NEGATIVE mg/dL
LEUKOCYTES UA: NEGATIVE
Nitrite: NEGATIVE
PROTEIN: NEGATIVE mg/dL
SPECIFIC GRAVITY, URINE: 1.01 (ref 1.005–1.030)
Urobilinogen, UA: 0.2 mg/dL (ref 0.0–1.0)
pH: 7 (ref 5.0–8.0)

## 2014-12-16 NOTE — Patient Instructions (Signed)
Labor Induction  Labor induction is when steps are taken to cause a pregnant woman to begin the labor process. Most women go into labor on their own between 37 weeks and 42 weeks of the pregnancy. When this does not happen or when there is a medical need, methods may be used to induce labor. Labor induction causes a pregnant woman's uterus to contract. It also causes the cervix to soften (ripen), open (dilate), and thin out (efface). Usually, labor is not induced before 39 weeks of the pregnancy unless there is a problem with the baby or mother.  Before inducing labor, your health care provider will consider a number of factors, including the following:  The medical condition of you and the baby.   How many weeks along you are.   The status of the baby's lung maturity.   The condition of the cervix.   The position of the baby.  WHAT ARE THE REASONS FOR LABOR INDUCTION? Labor may be induced for the following reasons:  The health of the baby or mother is at risk.   The pregnancy is overdue by 1 week or more.   The water breaks but labor does not start on its own.   The mother has a health condition or serious illness, such as high blood pressure, infection, placental abruption, or diabetes.  The amniotic fluid amounts are low around the baby.   The baby is distressed.  Convenience or wanting the baby to be born on a certain date is not a reason for inducing labor. WHAT METHODS ARE USED FOR LABOR INDUCTION? Several methods of labor induction may be used, such as:   Prostaglandin medicine. This medicine causes the cervix to dilate and ripen. The medicine will also start contractions. It can be taken by mouth or by inserting a suppository into the vagina.   Inserting a thin tube (catheter) with a balloon on the end into the vagina to dilate the cervix. Once inserted, the balloon is expanded with water, which causes the cervix to open.   Stripping the membranes. Your health  care provider separates amniotic sac tissue from the cervix, causing the cervix to be stretched and causing the release of a hormone called progesterone. This may cause the uterus to contract. It is often done during an office visit. You will be sent home to wait for the contractions to begin. You will then come in for an induction.   Breaking the water. Your health care provider makes a hole in the amniotic sac using a small instrument. Once the amniotic sac breaks, contractions should begin. This may still take hours to see an effect.   Medicine to trigger or strengthen contractions. This medicine is given through an IV access tube inserted into a vein in your arm.  All of the methods of induction, besides stripping the membranes, will be done in the hospital. Induction is done in the hospital so that you and the baby can be carefully monitored.  HOW LONG DOES IT TAKE FOR LABOR TO BE INDUCED? Some inductions can take up to 2-3 days. Depending on the cervix, it usually takes less time. It takes longer when you are induced early in the pregnancy or if this is your first pregnancy. If a mother is still pregnant and the induction has been going on for 2-3 days, either the mother will be sent home or a cesarean delivery will be needed. WHAT ARE THE RISKS ASSOCIATED WITH LABOR INDUCTION? Some of the risks of induction   include:   Changes in fetal heart rate, such as too high, too low, or erratic.   Fetal distress.   Chance of infection for the mother and baby.   Increased chance of having a cesarean delivery.   Breaking off (abruption) of the placenta from the uterus (rare).   Uterine rupture (very rare).  When induction is needed for medical reasons, the benefits of induction may outweigh the risks. WHAT ARE SOME REASONS FOR NOT INDUCING LABOR? Labor induction should not be done if:   It is shown that your baby does not tolerate labor.   You have had previous surgeries on your  uterus, such as a myomectomy or the removal of fibroids.   Your placenta lies very low in the uterus and blocks the opening of the cervix (placenta previa).   Your baby is not in a head-down position.   The umbilical cord drops down into the birth canal in front of the baby. This could cut off the baby's blood and oxygen supply.   You have had a previous cesarean delivery.   There are unusual circumstances, such as the baby being extremely premature.  Document Released: 02/02/2007 Document Revised: 05/16/2013 Document Reviewed: 04/12/2013 ExitCare Patient Information 2015 [employer]. This information is not intended to replace advice given to you by your health care provider. Make sure you discuss any questions you have with your health care provider.  

## 2014-12-16 NOTE — Progress Notes (Signed)
Leland JohnsJoyce Njorge used as interpreter for this encounter.

## 2014-12-16 NOTE — Progress Notes (Signed)
NST reactive, IOL 39 weeks

## 2014-12-19 ENCOUNTER — Inpatient Hospital Stay (HOSPITAL_COMMUNITY)
Admission: RE | Admit: 2014-12-19 | Discharge: 2014-12-21 | DRG: 775 | Disposition: A | Discharge status: Home / Self Care | Payer: Medicaid Other | Source: Ambulatory Visit | Attending: Family Medicine | Admitting: Family Medicine

## 2014-12-19 ENCOUNTER — Encounter (HOSPITAL_COMMUNITY): Payer: Self-pay

## 2014-12-19 DIAGNOSIS — O1002 Pre-existing essential hypertension complicating childbirth: Secondary | ICD-10-CM | POA: Diagnosis present

## 2014-12-19 DIAGNOSIS — O99824 Streptococcus B carrier state complicating childbirth: Secondary | ICD-10-CM | POA: Diagnosis present

## 2014-12-19 DIAGNOSIS — Z3A39 39 weeks gestation of pregnancy: Secondary | ICD-10-CM | POA: Diagnosis present

## 2014-12-19 DIAGNOSIS — D573 Sickle-cell trait: Secondary | ICD-10-CM | POA: Diagnosis present

## 2014-12-19 DIAGNOSIS — O9902 Anemia complicating childbirth: Secondary | ICD-10-CM | POA: Diagnosis present

## 2014-12-19 DIAGNOSIS — O133 Gestational [pregnancy-induced] hypertension without significant proteinuria, third trimester: Principal | ICD-10-CM | POA: Diagnosis present

## 2014-12-19 DIAGNOSIS — Z8249 Family history of ischemic heart disease and other diseases of the circulatory system: Secondary | ICD-10-CM

## 2014-12-19 LAB — COMPREHENSIVE METABOLIC PANEL
ALT: 33 U/L (ref 0–35)
AST: 26 U/L (ref 0–37)
Albumin: 3.4 g/dL — ABNORMAL LOW (ref 3.5–5.2)
Alkaline Phosphatase: 130 U/L — ABNORMAL HIGH (ref 39–117)
Anion gap: 7 (ref 5–15)
BILIRUBIN TOTAL: 0.3 mg/dL (ref 0.3–1.2)
BUN: 5 mg/dL — ABNORMAL LOW (ref 6–23)
CALCIUM: 9.1 mg/dL (ref 8.4–10.5)
CO2: 24 mmol/L (ref 19–32)
CREATININE: 0.53 mg/dL (ref 0.50–1.10)
Chloride: 105 mmol/L (ref 96–112)
GFR calc Af Amer: >90 mL/min (ref >90)
GLUCOSE: 67 mg/dL — AB (ref 70–99)
Potassium: 3.9 mmol/L (ref 3.5–5.1)
Sodium: 136 mmol/L (ref 135–145)
Total Protein: 6.4 g/dL (ref 6.0–8.3)

## 2014-12-19 LAB — CBC
HEMATOCRIT: 39.1 % (ref 36.0–46.0)
Hemoglobin: 13.8 g/dL (ref 12.0–15.0)
MCH: 30.7 pg (ref 26.0–34.0)
MCHC: 35.3 g/dL (ref 30.0–36.0)
MCV: 87.1 fL (ref 78.0–100.0)
PLATELETS: 250 10*3/uL (ref 150–400)
RBC: 4.49 MIL/uL (ref 3.87–5.11)
RDW: 13.3 % (ref 11.5–15.5)
WBC: 7.6 10*3/uL (ref 4.0–10.5)

## 2014-12-19 LAB — TYPE AND SCREEN
ABO/RH(D): O POS
ANTIBODY SCREEN: NEGATIVE

## 2014-12-19 LAB — PROTEIN / CREATININE RATIO, URINE
CREATININE, URINE: 52 mg/dL
PROTEIN CREATININE RATIO: 0.13 (ref 0.00–0.15)
TOTAL PROTEIN, URINE: 7 mg/dL

## 2014-12-19 MED ORDER — IBUPROFEN 600 MG PO TABS
600.0000 mg | ORAL_TABLET | Freq: Four times a day (QID) | ORAL | Status: DC
  Administered 2014-12-19 – 2014-12-21 (×7): 600 mg via ORAL
  Filled 2014-12-19 (×7): qty 1

## 2014-12-19 MED ORDER — LACTATED RINGERS IV SOLN: 500.0000 mL | INTRAVENOUS | Status: DC | PRN

## 2014-12-19 MED ORDER — SENNOSIDES-DOCUSATE SODIUM 8.6-50 MG PO TABS
2.0000 | ORAL_TABLET | ORAL | Status: DC
  Administered 2014-12-20 (×2): 2 via ORAL
  Filled 2014-12-19 (×2): qty 2

## 2014-12-19 MED ORDER — LABETALOL HCL 200 MG PO TABS
200.0000 mg | ORAL_TABLET | Freq: Two times a day (BID) | ORAL | Status: DC
  Administered 2014-12-19 – 2014-12-21 (×5): 200 mg via ORAL
  Filled 2014-12-19 (×3): qty 1
  Filled 2014-12-19: qty 2
  Filled 2014-12-19 (×4): qty 1

## 2014-12-19 MED ORDER — ACETAMINOPHEN 325 MG PO TABS: 650.0000 mg | ORAL_TABLET | ORAL | Status: DC | PRN

## 2014-12-19 MED ORDER — ZOLPIDEM TARTRATE 5 MG PO TABS: 5.0000 mg | ORAL_TABLET | Freq: Every evening | ORAL | Status: DC | PRN

## 2014-12-19 MED ORDER — LACTATED RINGERS IV SOLN
INTRAVENOUS | Status: DC
  Administered 2014-12-19: 09:00:00 via INTRAVENOUS

## 2014-12-19 MED ORDER — TERBUTALINE SULFATE 1 MG/ML IJ SOLN
0.2500 mg | Freq: Once | INTRAMUSCULAR | Status: DC | PRN
  Filled 2014-12-19: qty 1

## 2014-12-19 MED ORDER — ONDANSETRON HCL 4 MG/2ML IJ SOLN: 4.0000 mg | INTRAMUSCULAR | Status: DC | PRN

## 2014-12-19 MED ORDER — OXYCODONE-ACETAMINOPHEN 5-325 MG PO TABS: 2.0000 | ORAL_TABLET | ORAL | Status: DC | PRN

## 2014-12-19 MED ORDER — OXYTOCIN 40 UNITS IN LACTATED RINGERS INFUSION - SIMPLE MED: 62.5000 mL/h | INTRAVENOUS | Status: DC

## 2014-12-19 MED ORDER — PENICILLIN G POTASSIUM 5000000 UNITS IJ SOLR
2.5000 10*6.[IU] | INTRAMUSCULAR | Status: DC
  Filled 2014-12-19 (×5): qty 2.5

## 2014-12-19 MED ORDER — WITCH HAZEL-GLYCERIN EX PADS: 1.0000 "application " | MEDICATED_PAD | CUTANEOUS | Status: DC | PRN

## 2014-12-19 MED ORDER — SIMETHICONE 80 MG PO CHEW: 80.0000 mg | CHEWABLE_TABLET | ORAL | Status: DC | PRN

## 2014-12-19 MED ORDER — LIDOCAINE HCL (PF) 1 % IJ SOLN
30.0000 mL | INTRAMUSCULAR | Status: DC | PRN
  Filled 2014-12-19: qty 30

## 2014-12-19 MED ORDER — OXYTOCIN BOLUS FROM INFUSION: 500.0000 mL | INTRAVENOUS | Status: DC

## 2014-12-19 MED ORDER — FLEET ENEMA 7-19 GM/118ML RE ENEM: 1.0000 | ENEMA | Freq: Every day | RECTAL | Status: DC | PRN

## 2014-12-19 MED ORDER — PRENATAL MULTIVITAMIN CH
1.0000 | ORAL_TABLET | Freq: Every day | ORAL | Status: DC
  Administered 2014-12-20 – 2014-12-21 (×2): 1 via ORAL
  Filled 2014-12-19 (×2): qty 1

## 2014-12-19 MED ORDER — DIPHENHYDRAMINE HCL 25 MG PO CAPS: 25.0000 mg | ORAL_CAPSULE | Freq: Four times a day (QID) | ORAL | Status: DC | PRN

## 2014-12-19 MED ORDER — CITRIC ACID-SODIUM CITRATE 334-500 MG/5ML PO SOLN: 30.0000 mL | ORAL | Status: DC | PRN

## 2014-12-19 MED ORDER — BENZOCAINE-MENTHOL 20-0.5 % EX AERO: 1.0000 "application " | INHALATION_SPRAY | CUTANEOUS | Status: DC | PRN

## 2014-12-19 MED ORDER — PENICILLIN G POTASSIUM 5000000 UNITS IJ SOLR
5.0000 10*6.[IU] | Freq: Once | INTRAVENOUS | Status: AC
  Administered 2014-12-19: 5 10*6.[IU] via INTRAVENOUS
  Filled 2014-12-19: qty 5

## 2014-12-19 MED ORDER — OXYCODONE-ACETAMINOPHEN 5-325 MG PO TABS
1.0000 | ORAL_TABLET | ORAL | Status: DC | PRN
  Administered 2014-12-19: 1 via ORAL
  Filled 2014-12-19: qty 1

## 2014-12-19 MED ORDER — LABETALOL HCL 5 MG/ML IV SOLN
20.0000 mg | INTRAVENOUS | Status: DC | PRN
Start: 2014-12-19 — End: 2014-12-21

## 2014-12-19 MED ORDER — ONDANSETRON HCL 4 MG/2ML IJ SOLN: 4.0000 mg | Freq: Four times a day (QID) | INTRAMUSCULAR | Status: DC | PRN

## 2014-12-19 MED ORDER — TETANUS-DIPHTH-ACELL PERTUSSIS 5-2.5-18.5 LF-MCG/0.5 IM SUSP
0.5000 mL | Freq: Once | INTRAMUSCULAR | Status: AC
  Administered 2014-12-20: 0.5 mL via INTRAMUSCULAR
  Filled 2014-12-19: qty 0.5

## 2014-12-19 MED ORDER — LANOLIN HYDROUS EX OINT: TOPICAL_OINTMENT | CUTANEOUS | Status: DC | PRN

## 2014-12-19 MED ORDER — DIBUCAINE 1 % RE OINT: 1.0000 "application " | TOPICAL_OINTMENT | RECTAL | Status: DC | PRN

## 2014-12-19 MED ORDER — HYDRALAZINE HCL 20 MG/ML IJ SOLN
5.0000 mg | INTRAMUSCULAR | Status: DC | PRN
  Administered 2014-12-19: 10 mg via INTRAVENOUS
  Filled 2014-12-19: qty 1

## 2014-12-19 MED ORDER — ONDANSETRON HCL 4 MG PO TABS: 4.0000 mg | ORAL_TABLET | ORAL | Status: DC | PRN

## 2014-12-19 MED ORDER — OXYTOCIN 40 UNITS IN LACTATED RINGERS INFUSION - SIMPLE MED
1.0000 m[IU]/min | INTRAVENOUS | Status: DC
  Administered 2014-12-19: 2 m[IU]/min via INTRAVENOUS
  Filled 2014-12-19: qty 1000

## 2014-12-19 MED ORDER — OXYCODONE-ACETAMINOPHEN 5-325 MG PO TABS
1.0000 | ORAL_TABLET | ORAL | Status: DC | PRN
  Administered 2014-12-20 (×2): 1 via ORAL
  Filled 2014-12-19 (×2): qty 1

## 2014-12-19 NOTE — H&P (Signed)
Carrie Duncan is a 29 y.o. female, [facility] patient, presenting for scheduled IOL for cHTN.  Maternal Medical History:  Reason for admission: Nausea.   Carrie Duncan is a 29 y.o. female, native language swahili, [facility] patient, GBS positive, sickle cell trait, presenting for scheduled IOL for cHTN.  Denies headache, visual changes, edema.  This pregnancy: Fetal echo 08/07/14 (results unavailable), repeated 09/12/2014, wnl.  Single umbilical artery. Previous pregnancies w/gHTN, preeclampsia, anemia.  One induction at 26w due to blood pressure.  Two Induced deliveries at term for blood pressure.      [facility] Prenatal Labs  Dating Certain LMP, c/w 18 week [place] Blood type: O/POS/-- (10/13 1115)  Genetic Screen Quad: neg  Antibody:NEG (10/13 1115)  Anatomic [place] single umbilical artery Rubella: 16.30 (10/13 1115)  GTT Early: Third trimester: 112 RPR: NON REAC (10/13 1115)   TDaP vaccine 10/09/14 HBsAg: NEGATIVE (10/13 1115)   Flu vaccine 07/09/14 HIV: NONREACTIVE (10/13 1115)   GBS Pos GBS: Pos  Contraception mirena Pap: neg; GC/CT neg x 2  Baby Food Breastfeed   Circumcision Declines   [facility]   Support Person Husband       OB History    Gravida Para Term Preterm AB TAB SAB Ectopic Multiple Living   0 0    2     Past Medical History  Diagnosis Date  . Gestational HTN   . Pregnancy induced hypertension   . Chronic hypertension in pregnancy 03/29/2013    Start antenatal testing at 37 weeks.     Past Surgical History  Procedure Laterality Date  . No past surgeries     Family History: family history includes Hypertension in her father. There is no history of Alcohol abuse, Arthritis, Asthma, Birth defects, Cancer, COPD, Depression, Diabetes, Drug abuse, Early death, Hearing loss, Heart disease, Hyperlipidemia, Kidney disease, Learning disabilities, Mental illness, Mental retardation, Miscarriages /  Stillbirths, Stroke, or Vision loss. Social History:  reports that she has never smoked. She has never used smokeless tobacco. She reports that she does not drink alcohol or use illicit drugs.   Prenatal Transfer Tool  Maternal Diabetes: No Genetic Screening: Normal Maternal Ultrasounds/Referrals: Normal Fetal Ultrasounds or other Referrals:  Fetal echo, Other: SUA wnl Maternal Substance Abuse:  No Significant Maternal Medications:  None Significant Maternal Lab Results:  Lab values include: Group B Strep positive, Other: [place] trait Other Comments:   trait, Single umbilical artery  Review of Systems  Constitutional: Negative for fever and chills.  Eyes: Negative for blurred vision.  Respiratory: Negative for cough and shortness of breath.   Cardiovascular: Negative for leg swelling.  Gastrointestinal: Negative for nausea, vomiting, abdominal pain and diarrhea.  Genitourinary: Negative for dysuria and hematuria.  Neurological: Negative for headaches.    Patient Vitals for the past 24 hrs:  BP Temp Temp src Pulse Resp Height Weight  12/19/14 0849 (!) 147/102 mmHg 98.1 F (36.7 C) Oral 71 20 - -  12/19/14 0846 - - - - - 5' (1.524 m) 155 lb (70.308 kg)    Exam Physical Exam  Constitutional: She appears well-developed and well-nourished. No distress.  HENT:  Head: Normocephalic and atraumatic.  Eyes: Conjunctivae are normal. Pupils are equal, round, and reactive to light.  Neck: Normal range of motion.  Cardiovascular: Normal rate.   Respiratory: Effort normal. No respiratory distress.  GI: Soft. She exhibits no distension. There is no tenderness.  Musculoskeletal: Normal range of motion. She exhibits no  edema.  Neurological: She is alert. Coordination normal.  Skin: Skin is warm and dry. She is not diaphoretic.  Cervical exam: 1.5/thick/-3, medium, posterior.    Prenatal labs: ABO, Rh: O/POS/-- (10/13 1115) Antibody: NEG (10/13 1115) Rubella: 16.30 (10/13 1115) RPR: NON  REAC (01/13 1611)  HBsAg: NEGATIVE (10/13 1115)  HIV: NONREACTIVE (01/13 1611)  GBS: Positive (03/03 0000)   Assessment/Plan: A: Carrie Duncan is a 29 y.o. female, GBS positive, presenting for scheduled IOL for cHTN.        Vertex presentation confirmed via ultrasound  P: Admit to birthing suites for induction of labor for cTHN.   Anticipate active management of labor  # Labor: Foley placed.  Pitocin at 2 until foley out.   # ZOX:[id number]:Category 1: 135 bpm with mod variability, accels, occasional variable decel # ID:GBS positive, Start PCN in active labor, ROM or with pitocin, whichever is first.  # MOF: Breast # Circumcision: Female, no circ # MOC: Chart says mirena, pt wants to think about it # gHTN: BP appropriate at this time, will check CBC, CMP, Pr/Cr ratio  Henson,Amber 12/19/2014, 8:43 AM   I was present for the exam and agree with above. CNM placed foley bulb. CTO BP closely. May needs meds.   [place] Baily Serpe, [place] 12/19/2014 2:47 PM

## 2014-12-19 NOTE — Progress Notes (Signed)
LABOR PROGRESS NOTE  Rosamaria LintsChantal Geiger is a 29 y.o. [id number] at 3455w0d  admitted for scheduled IOL for cHTN  Subjective: Uncomfortable with contractions, resting between  Objective: BP 155/110 mmHg  Pulse 85  Temp(Src) 98.3 F (36.8 C) (Oral)  Resp 20  Ht 5' (1.524 m)  Wt 70.308 kg (155 lb)  BMI 30.27 kg/m2  LMP 03/21/2014 or  Filed Vitals:   12/19/14 1533 12/19/14 1640 12/19/14 1641 12/19/14 1705  BP: 140/90  161/103 155/110  Pulse: 81  95 85  Temp:  98.3 F (36.8 C)    TempSrc:  Oral    Resp:  20  20  Height:      Weight:       Alert and oriented x 3, breathing comfortably, peripheral pulses intact  Dilation: 7 Effacement (%): 60 Cervical Position: Posterior Station: +1 Presentation: Vertex Exam by:: hensen  Labs: Lab Results  Component Value Date   WBC 7.6 12/19/2014   HGB 13.8 12/19/2014   HCT 39.1 12/19/2014   MCV 87.1 12/19/2014   PLT 250 12/19/2014    Patient Active Problem List   Diagnosis Date Noted  . Labor and delivery, indication for care 12/19/2014  . Group B Streptococcus carrier, +RV culture, currently pregnant 12/09/2014  . Chronic hypertension in pregnancy   . Single umbilical artery, maternal, antepartum   . Anemia affecting pregnancy in second trimester 07/16/2014  . Supervision of high risk pregnancy in third trimester 07/09/2014  . Hx of preeclampsia, prior pregnancy, currently pregnant 03/29/2013  . Sickle cell trait 11/30/2012    Assessment / Plan: 29 y.o. [id number] at 6955w0d here for IOL for cHTN  Labor: Progressing as expected.  S/p ROM, pitocin at 3 mL/hr Fetal Wellbeing: Cat II.  135 bpm, mod variability, accels, occasional variable late decels Pain Control:  None desired at this time Anticipated MOD:  NSVD Hypertension: BP 155/110.  On Labetalol 200mg  BID.  Labs wnl.  I/D: GBS+ s/p one dose  Breckan Cafiero, MD 12/19/2014, 5:46 PM

## 2014-12-20 ENCOUNTER — Encounter (HOSPITAL_COMMUNITY): Payer: Self-pay

## 2014-12-20 LAB — CBC
HEMATOCRIT: 35.5 % — AB (ref 36.0–46.0)
HEMOGLOBIN: 12.5 g/dL (ref 12.0–15.0)
MCH: 30.6 pg (ref 26.0–34.0)
MCHC: 35.2 g/dL (ref 30.0–36.0)
MCV: 87 fL (ref 78.0–100.0)
Platelets: 231 10*3/uL (ref 150–400)
RBC: 4.08 MIL/uL (ref 3.87–5.11)
RDW: 13.2 % (ref 11.5–15.5)
WBC: 12.9 10*3/uL — ABNORMAL HIGH (ref 4.0–10.5)

## 2014-12-20 LAB — RPR: RPR: NONREACTIVE

## 2014-12-20 NOTE — Progress Notes (Signed)
Post Partum Day #1 Subjective: up ad lib, voiding, tolerating PO, + flatus and mild HA, but no visual changes  Objective: Blood pressure 118/68, pulse 79, temperature 99.1 F (37.3 C), temperature source Oral, resp. rate 18, height 5' (1.524 m), weight 70.308 kg (155 lb), last menstrual period 03/21/2014, unknown if currently breastfeeding.  Physical Exam:  General: alert, cooperative and no distress Lochia: appropriate Uterine Fundus: firm DVT Evaluation: No evidence of DVT seen on physical exam. Negative Homan's sign. No cords or calf tenderness. No significant calf/ankle edema.   Recent Labs  12/19/14 0905 12/20/14 0515  HGB 13.8 12.5  HCT 39.1 35.5*    Assessment/Plan: Plan for discharge tomorrow, Breastfeeding and Contraception Nexplanon   LOS: 1 day   Shiri Hodapp Wynne 12/20/2014, 7:54 AM

## 2014-12-20 NOTE — Lactation Note (Signed)
This note was copied from the chart of Carrie Avina Runquist. Lactation Consultation Note Experienced BF mom BF 1st child 2 yrs. And 2nd child 12 months. Mom has very large nipples. This baby has a small mouth. Noticed frequent pushing nipple out w/tongue.  Mom BF in side lying position wrapped up. Unwrapped baby, encouraged STS. Assisted in latching. Discussed positioning. Mom stated her nipple large. Discussed deep latch verses shallow latching. Said baby was doing both. Some tongue thrusting noted.  Mom stated she didn't have anything in my breast. Hand expression taught w/good flow of colostrum. Mom smiled.  Mom encouraged to feed baby 8-12 times/24 hours and with feeding cues. Mom encouraged to feed baby w/feeding cues. Educated about newborn behavior. Encouraged to call for assistance if needed and to verify proper latch.WH/LC brochure given w/resources, support groups and LC services. Encouraged mom to be aggressive w/BF since baby is SGA. Patient Name: Carrie Rosamaria LintsChantal Duncan ZOXWR'UToday's Date: 12/20/2014 Reason for consult: Initial assessment   Maternal Data Has patient been taught Hand Expression?: Yes Does the patient have breastfeeding experience prior to this delivery?: Yes  Feeding Feeding Type: Breast Fed Length of feed: 15 min (still BF)  LATCH Score/Interventions Latch: Grasps breast easily, tongue down, lips flanged, rhythmical sucking.  Audible Swallowing: A few with stimulation Intervention(s): Skin to skin;Hand expression  Type of Nipple: Everted at rest and after stimulation  Comfort (Breast/Nipple): Soft / non-tender     Hold (Positioning): Assistance needed to correctly position infant at breast and maintain latch. Intervention(s): Skin to skin;Position options;Support Pillows;Breastfeeding basics reviewed  LATCH Score: 8  Lactation Tools Discussed/Used     Consult Status Consult Status: Follow-up Date: 12/21/14 Follow-up type: In-patient    Charyl DancerCARVER, Carrie Duncan  G 12/20/2014, 6:17 AM

## 2014-12-21 MED ORDER — DOCUSATE SODIUM 100 MG PO CAPS: 100.0000 mg | ORAL_CAPSULE | Freq: Two times a day (BID) | ORAL | Status: DC | PRN

## 2014-12-21 MED ORDER — IBUPROFEN 600 MG PO TABS: 600.0000 mg | ORAL_TABLET | Freq: Four times a day (QID) | ORAL | Status: DC

## 2014-12-21 NOTE — Progress Notes (Signed)
Clinical Social Work Department PSYCHOSOCIAL ASSESSMENT - MATERNAL/CHILD 12/21/2014  Patient:  Carrie Duncan,Carrie Duncan  Account Number:  [id number]  Admit Date:  12/19/2014  Childs Name:   Carrie Duncan    Clinical Social Worker:  Jalayia Bagheri, LCSW   Date/Time:  12/21/2014 12:30 PM  Date Referred:  12/21/2014   Referral source  Physician     Referred reason  Psychosocial assessment   Other referral source:    I:  FAMILY / HOME ENVIRONMENT Child's legal guardian:  PARENT  Guardian - Name Guardian - Age Guardian - Address  Carrie Duncan,Carrie Duncan [address]  [place], [place] [address]  Carrie Duncan, Carrie Duncan  same as above   Other household support members/support persons Other support:   Parents employed    II  PSYCHOSOCIAL DATA Information Source:    Financial and Community Resources Employment:   Financial resources:  Medicaid If Medicaid - County:    School / Grade:   Maternity Care Coordinator / Child Services Coordination / Early Interventions:  Cultural issues impacting care:    III  STRENGTHS Strengths  Supportive family/friends  Home prepared for Child (including basic supplies)  Adequate Resources   Strength comment:    IV  RISK FACTORS AND CURRENT PROBLEMS Current Problem:       V  SOCIAL WORK ASSESSMENT Met with mother who was pleasant and receptive to social work intervention.  Parents are married.  They are of [place] descent and have been residing in the [place] for the past 3 years.  They have two other dependents.   Both parents are employed.   Good interaction with newborn noted.   She spoke softly and her words were barely audible at times.  She presents as a very timid person.    Mother denies any hx of substance abuse or mental illness.  No acute social concerns related at this time.  Informed her of CSW availability.      VI SOCIAL WORK PLAN Social Work Plan  No Further Intervention Required / No Barriers to Discharge   Type of pt/family  education:   If child protective services report - county:   If child protective services report - date:   Information/referral to community resources comment:   Other social work plan:     

## 2014-12-21 NOTE — Lactation Note (Signed)
This note was copied from the chart of Carrie Duncan. Lactation Consultation Note  Patient Name: Carrie Rosamaria LintsChantal Kallam WUJWJ'XToday's Date: 12/21/2014   Visited with Carrie Duncan and Carrie Duncan, baby 1541 hrs old.  Baby cluster fed throughout the night, latch scores of 9.  Reminded Carrie Duncan of importance of feeding baby often, skin to skin, and using breast compression to increase milk transfer.  Explained that due to baby being little, he could be more sleepy at the breast.  After Pediatrician checked baby, he wakened so I helped Carrie Duncan position baby in football hold.  No rooting at this time, as baby had "just fed".  Carrie Duncan has lots of what looks like transitional milk.  Engorgement prevention and treatment discussed.  Carrie Duncan has experience breast feeding her daughter 2 years, and her son 1 year.  Encouraged her to call our office for any assistance or to make an appointment.  Baby to see Pediatrician on Monday 3/28.        Carrie Duncan, Carrie Duncan E 12/21/2014, 12:08 PM

## 2014-12-21 NOTE — Discharge Summary (Signed)
Obstetric Discharge Summary  PPD#2 Reason for Admission: induction of labor and due to gHTN Prenatal Procedures: none Intrapartum Procedures: spontaneous vaginal delivery Postpartum Procedures: none Complications-Operative and Postpartum: none  Delivery Note At 6:38 PM a healthy female was delivered via Vaginal, Spontaneous Delivery (Presentation: Right Occiput Anterior). APGAR: 9, 9; weight pending.  Placenta status: Intact, Spontaneous. Cord: 2 vessels with the following complications: None.   Anesthesia: None  Episiotomy: None Lacerations: None Suture Repair: 3.0 vicryl Est. Blood Loss (mL): 100  Mom to postpartum. Baby to Couplet care / Skin to Skin.  Breastfeeding No circumcision Nexplanon for contraception  Hospital Course:  Active Problems:   Labor and delivery, indication for care   Carrie LintsChantal Masur is a 29 y.o. [phone], native language swahili, [facility] patient, GBS positive, sickle cell trait s/p SVD.  Patient was admitted to L&D for IOL due to gHTN.  She has postpartum course that was uncomplicated including no problems with ambulating, PO intake, urination, pain, or bleeding. The pt feels ready to go home and  will be discharged with outpatient follow-up on 4/28.   Today: No acute events overnight.  Pt denies problems with ambulating, voiding or po intake.  She reports nausea with no vomiting.  Pain is well controlled 3/10.  She has had flatus. She has not had bowel movement.  Lochia small.  Plan for birth control is  Nexplanon.  Method of Feeding: Breast  Physical Exam:  General: alert and no distress Lochia: appropriate Uterine Fundus: firm DVT Evaluation: no edema on extremity  H/H: Lab Results  Component Value Date/Time   HGB 12.5 12/20/2014 05:15 AM   HCT 35.5* 12/20/2014 05:15 AM    Discharge Diagnoses: Term Pregnancy-delivered  Medications: None  Discharge Information: Date: 12/21/2014 Activity: pelvic rest Diet: routine  Medications:  Ibuprofen and Colace Breast feeding:  Yes Condition: stable Instructions: refer to handout Discharge to: home    LEFTWICH-KIRBY, Jontez Redfield, CNM 10:08 AM

## 2014-12-21 NOTE — Discharge Instructions (Signed)

## 2014-12-24 ENCOUNTER — Telehealth: Payer: Self-pay | Admitting: General Practice

## 2014-12-24 ENCOUNTER — Inpatient Hospital Stay (HOSPITAL_COMMUNITY)
Admission: AD | Admit: 2014-12-24 | Discharge: 2014-12-24 | Disposition: A | Discharge status: Home / Self Care | Payer: Medicaid Other | Source: Ambulatory Visit | Attending: Family Medicine | Admitting: Family Medicine

## 2014-12-24 ENCOUNTER — Encounter (HOSPITAL_COMMUNITY): Payer: Self-pay | Admitting: *Deleted

## 2014-12-24 DIAGNOSIS — O135 Gestational [pregnancy-induced] hypertension without significant proteinuria, complicating the puerperium: Secondary | ICD-10-CM

## 2014-12-24 DIAGNOSIS — R51 Headache: Secondary | ICD-10-CM | POA: Diagnosis present

## 2014-12-24 DIAGNOSIS — R030 Elevated blood-pressure reading, without diagnosis of hypertension: Secondary | ICD-10-CM | POA: Diagnosis not present

## 2014-12-24 DIAGNOSIS — R42 Dizziness and giddiness: Secondary | ICD-10-CM | POA: Insufficient documentation

## 2014-12-24 DIAGNOSIS — O9089 Other complications of the puerperium, not elsewhere classified: Secondary | ICD-10-CM | POA: Insufficient documentation

## 2014-12-24 LAB — CBC
HCT: 39.5 % (ref 36.0–46.0)
Hemoglobin: 14 g/dL (ref 12.0–15.0)
MCH: 30.4 pg (ref 26.0–34.0)
MCHC: 35.4 g/dL (ref 30.0–36.0)
MCV: 85.9 fL (ref 78.0–100.0)
PLATELETS: 337 10*3/uL (ref 150–400)
RBC: 4.6 MIL/uL (ref 3.87–5.11)
RDW: 12.9 % (ref 11.5–15.5)
WBC: 8.6 10*3/uL (ref 4.0–10.5)

## 2014-12-24 LAB — COMPREHENSIVE METABOLIC PANEL
ALK PHOS: 99 U/L (ref 39–117)
ALT: 34 U/L (ref 0–35)
AST: 24 U/L (ref 0–37)
Albumin: 3.8 g/dL (ref 3.5–5.2)
Anion gap: 9 (ref 5–15)
BILIRUBIN TOTAL: 0.3 mg/dL (ref 0.3–1.2)
BUN: 12 mg/dL (ref 6–23)
CHLORIDE: 105 mmol/L (ref 96–112)
CO2: 24 mmol/L (ref 19–32)
Calcium: 8.7 mg/dL (ref 8.4–10.5)
Creatinine, Ser: 0.66 mg/dL (ref 0.50–1.10)
Glucose, Bld: 72 mg/dL (ref 70–99)
POTASSIUM: 4 mmol/L (ref 3.5–5.1)
Sodium: 138 mmol/L (ref 135–145)
TOTAL PROTEIN: 7.1 g/dL (ref 6.0–8.3)

## 2014-12-24 LAB — URINALYSIS W MICROSCOPIC (NOT AT ARMC)
BILIRUBIN URINE: NEGATIVE
Glucose, UA: NEGATIVE mg/dL
Ketones, ur: NEGATIVE mg/dL
Leukocytes, UA: NEGATIVE
NITRITE: NEGATIVE
PROTEIN: NEGATIVE mg/dL
Specific Gravity, Urine: <1.005 — ABNORMAL LOW (ref 1.005–1.030)
Urobilinogen, UA: 0.2 mg/dL (ref 0.0–1.0)
pH: 7 (ref 5.0–8.0)

## 2014-12-24 LAB — PROTEIN / CREATININE RATIO, URINE
Creatinine, Urine: 44 mg/dL
Protein Creatinine Ratio: 0.18 — ABNORMAL HIGH (ref 0.00–0.15)
Total Protein, Urine: 8 mg/dL

## 2014-12-24 MED ORDER — LABETALOL HCL 5 MG/ML IV SOLN
20.0000 mg | INTRAVENOUS | Status: DC | PRN
  Administered 2014-12-24: 20 mg via INTRAVENOUS
  Filled 2014-12-24: qty 4

## 2014-12-24 MED ORDER — HYDRALAZINE HCL 20 MG/ML IJ SOLN
5.0000 mg | INTRAMUSCULAR | Status: DC | PRN
  Filled 2014-12-24: qty 1

## 2014-12-24 MED ORDER — AMLODIPINE BESYLATE 5 MG PO TABS: 5.0000 mg | ORAL_TABLET | Freq: Every day | ORAL | Status: DC

## 2014-12-24 MED ORDER — LABETALOL HCL 100 MG PO TABS
200.0000 mg | ORAL_TABLET | Freq: Once | ORAL | Status: AC
  Administered 2014-12-24: 200 mg via ORAL
  Filled 2014-12-24: qty 2

## 2014-12-24 MED ORDER — LACTATED RINGERS IV SOLN
INTRAVENOUS | Status: DC
  Administered 2014-12-24: 19:00:00 via INTRAVENOUS

## 2014-12-24 NOTE — MAU Note (Signed)
Vaginal delivery on 03/24; induced for high blood pressure.  Home nurse today 158/104.  Has a headache, did not take anything, occ sees spots, denies epigastric pain, no swelling noted. baby is doing well, pt is breast feeding.

## 2014-12-24 NOTE — Progress Notes (Signed)
Current BP of 145/97 reported to V. Katrinka BlazingSmith, CNM. Advised to hold meds for now.

## 2014-12-24 NOTE — H&P (Deleted)
Chief Complaint: Hypertension   First Provider Initiated Contact with Patient 12/24/14 1852     SUBJECTIVE HPI: Carrie Duncan is a 29 y.o. [id number] at 5 days post partum who presents to M[facility] Admissions reporting headache associated with hypertension.  Pt reports a hx of chronic HTN and was on blood pressure medication since 3-5 years ago although no documentation of HTN or antihypertensive therapy outside of pregnancy or the postpartum period was found in chart.  Pt cannot recall the name of the medication.  Pt was positive for headache, negative for scotoma, hyperreflexia, peripheral edema, and RUQ pain.  Induced for Chronic vs GHTN at 39 weeks. Some severe-range BP's in labor, but improved at delivery. No Mag Sulfate given. No meds at D/C. States she gets similar generalized throbbing HA whenever BP is elevated during or remote from pregnancy. Rates pain 3/10 on pain scale. Hasn't taken anything for pain and declines pain meds.   Past Medical History  Diagnosis Date  . Gestational HTN   . Pregnancy induced hypertension   . Chronic hypertension in pregnancy 03/29/2013    Start antenatal testing at 37 weeks.     OB History  Gravida Para Term Preterm AB SAB TAB Ectopic Multiple Living  4 4 3 1  0  0  0 3    # Outcome Date GA Lbr Len/2nd Weight Sex Delivery Anes PTL Lv  4 Term 12/19/14 3471w0d 07:06 / 00:02 2.52 kg (5 lb 8.9 oz) M Vag-Spont None  Y  3 Term 04/13/13 2170w3d 09:32 / 00:35 2.98 kg (6 lb 9.1 oz) M Vag-Spont None  Y  2 Term 01/2010 5165w0d  2 kg (4 lb 6.6 oz) F Vag-Spont   Y     Comments: HTN (required meds)  1 Preterm 2009 7818w0d      N      Comments: Induction of labor due to blood pressue -  was born at 6 months     Past Surgical History  Procedure Laterality Date  . No past surgeries     History   Social History  . Marital Status: Married    Spouse Name: N/A  . Number of Children: N/A  . Years of Education: N/A   Occupational History  . Not on file.   Social  History Main Topics  . Smoking status: Never Smoker   . Smokeless tobacco: Never Used  . Alcohol Use: No  . Drug Use: No  . Sexual Activity: Yes    Birth Control/ Protection: None   Other Topics Concern  . Not on file   Social History Narrative   No current facility-administered medications on file prior to encounter.   Current Outpatient Prescriptions on File Prior to Encounter  Medication Sig Dispense Refill  . docusate sodium (COLACE) 100 MG capsule Take 1 capsule (100 mg total) by mouth 2 (two) times daily as needed. 30 capsule 2  . ibuprofen (ADVIL,MOTRIN) 600 MG tablet Take 1 tablet (600 mg total) by mouth every 6 (six) hours. 30 tablet 0  . Prenatal Multivit-Min-Fe-FA (PRENATAL VITAMINS) 0.8 MG tablet Take 1 tablet by mouth daily. 30 tablet 12   No Known Allergies  Review of Systems  Constitutional: Negative for fever and chills.  Eyes: Negative for blurred vision.  Respiratory: Negative for cough.   Cardiovascular: Negative for chest pain.  Gastrointestinal: Negative for nausea, vomiting and abdominal pain.       No RUQ pain  Genitourinary: Negative for dysuria.  Musculoskeletal:  Nl DTRs, no peripheral edema  Neurological: Positive for dizziness and headaches.    OBJECTIVE Blood pressure 155/104, pulse 70, temperature 98.7 F (37.1 C), temperature source Oral, resp. rate 18, currently breastfeeding.   oral temperature is 98.7 F (37.1 C). Her blood pressure is 151/97 and her pulse is 70. Her respiration is 18.   Patient Vitals for the past 24 hrs:  BP Temp Temp src Pulse Resp  12/24/14 1958 151/97 mmHg - - 70 -  12/24/14 1922 (!) 155/104 mmHg - - 70 -  12/24/14 1910 152/95 mmHg - - 70 -  12/24/14 1902 (!) 158/101 mmHg - - 68 -  12/24/14 1840 145/97 mmHg - - 66 -  12/24/14 1832 (!) 160/123 mmHg - - - -  12/24/14 1828 (!) 163/104 mmHg - - 65 -  12/24/14 1803 (!) 165/115 mmHg 98.7 F (37.1 C) Oral 68 18   GENERAL: Well-developed, well-nourished female  in no acute distress.  HEART: normal rate RESP: normal effort GI: Abdomen soft, non-tender. Positive bowel sounds 4. MS: Nontender, no peripheral  NEURO: Alert and oriented x3  LAB RESULTS Results for orders placed or performed during the hospital encounter of 12/24/14 (from the past 24 hour(s))  Urinalysis with microscopic     Status: Abnormal   Collection Time: 12/24/14  6:14 PM  Result Value Ref Range   Color, Urine YELLOW YELLOW   APPearance CLEAR CLEAR   Specific Gravity, Urine <1.005 (L) 1.005 - 1.030   pH 7.0 5.0 - 8.0   Glucose, UA NEGATIVE NEGATIVE mg/dL   Hgb urine dipstick MODERATE (A) NEGATIVE   Bilirubin Urine NEGATIVE NEGATIVE   Ketones, ur NEGATIVE NEGATIVE mg/dL   Protein, ur NEGATIVE NEGATIVE mg/dL   Urobilinogen, UA 0.2 0.0 - 1.0 mg/dL   Nitrite NEGATIVE NEGATIVE   Leukocytes, UA NEGATIVE NEGATIVE   RBC / HPF 3-6 <3 RBC/hpf   Squamous Epithelial / LPF RARE RARE  Protein / creatinine ratio, urine     Status: Abnormal   Collection Time: 12/24/14  6:14 PM  Result Value Ref Range   Creatinine, Urine 44.00 mg/dL   Total Protein, Urine 8 mg/dL   Protein Creatinine Ratio 0.18 (H) 0.00 - 0.15  Comprehensive metabolic panel     Status: None   Collection Time: 12/24/14  6:30 PM  Result Value Ref Range   Sodium 138 135 - 145 mmol/L   Potassium 4.0 3.5 - 5.1 mmol/L   Chloride 105 96 - 112 mmol/L   CO2 24 19 - 32 mmol/L   Glucose, Bld 72 70 - 99 mg/dL   BUN 12 6 - 23 mg/dL   Creatinine, Ser 1.91 0.50 - 1.10 mg/dL   Calcium 8.7 8.4 - 47.8 mg/dL   Total Protein 7.1 6.0 - 8.3 g/dL   Albumin 3.8 3.5 - 5.2 g/dL   AST 24 0 - 37 U/L   ALT 34 0 - 35 U/L   Alkaline Phosphatase 99 39 - 117 U/L   Total Bilirubin 0.3 0.3 - 1.2 mg/dL   GFR calc non Af Amer >90 >90 mL/min   GFR calc Af Amer >90 >90 mL/min   Anion gap 9 5 - 15  CBC     Status: None   Collection Time: 12/24/14  6:30 PM  Result Value Ref Range   WBC 8.6 4.0 - 10.5 K/uL   RBC 4.60 3.87 - 5.11 MIL/uL    Hemoglobin 14.0 12.0 - 15.0 g/dL   HCT 29.5 62.1 - 30.8 %  MCV 85.9 78.0 - 100.0 fL   MCH 30.4 26.0 - 34.0 pg   MCHC 35.4 30.0 - 36.0 g/dL   RDW 16.1 09.6 - 04.5 %   Platelets 337 150 - 400 K/uL    IMAGING  MAU COURSE Ordered IV Hydralazine for severe range BP's upn arrival, but BP's improved before any doses given. Pre-E labs drawn. IV placed.   Discussed BP's, Hx, nature of HA, Pre-E labs w/ Dr.Stinson. Will give Labetalol PO in MAU and Rx Amlodipine 5 mg PO QD at D/C and have pt return to clinic 12/27/14 for BP check.   Report given to Rochele Pages, CNM at 2010. Pt awaiting Labetalol.   [place], [place] 12/24/2014 8:24 PM

## 2014-12-24 NOTE — Progress Notes (Signed)
DTR's 1+, no clonus, no edema.

## 2014-12-24 NOTE — Telephone Encounter (Signed)
Patient called after hours line stating she had a baby recently and her blood pressure was 158/104. Called patient with pacific interpreter [phone] and discussed with patient she should go to MAU for further evaluation because her blood pressure is too high. Patient verbalized understanding and had no questions

## 2014-12-24 NOTE — MAU Provider Note (Signed)
ROS Chief Complaint: Hypertension  First Provider Initiated Contact with Patient 12/24/14 1852   SUBJECTIVE HPI: Carrie Duncan is a 29 y.o. [id number] at 5 days post partum who presents to [facility] Admissions reporting headache associated with hypertension. Pt reports a hx of chronic HTN and was on blood pressure medication since 3-5 years ago although no documentation of HTN or antihypertensive therapy outside of pregnancy or the postpartum period was found in chart. Pt cannot recall the name of the medication. Pt was positive for headache, negative for scotoma, hyperreflexia, peripheral edema, and RUQ pain.  Induced for Chronic vs GHTN at 39 weeks. Some severe-range BP's in labor, but improved at delivery. No Mag Sulfate given. No meds at D/C. States she gets similar generalized throbbing HA whenever BP is elevated during or remote from pregnancy. Rates pain 3/10 on pain scale. Hasn't taken anything for pain and declines pain meds.   Past Medical History  Diagnosis Date  . Gestational HTN   . Pregnancy induced hypertension   . Chronic hypertension in pregnancy 03/29/2013    Start antenatal testing at 37 weeks.    OB History  Gravida Para Term Preterm AB SAB TAB Ectopic Multiple Living  0  0  0 3    # Outcome Date GA Lbr Len/2nd Weight Sex Delivery Anes PTL Lv  4 Term 12/19/14 [redacted]w[redacted]d 07:06 / 00:02 2.52 kg (5 lb 8.9 oz) M Vag-Spont None  Y  3 Term 04/13/13 [redacted]w[redacted]d 09:32 / 00:35 2.98 kg (6 lb 9.1 oz) M Vag-Spont None  Y  2 Term 01/2010 [redacted]w[redacted]d  2 kg (4 lb 6.6 oz) F Vag-Spont   Y   Comments: HTN (required meds)  1 Preterm 2009 [redacted]w[redacted]d      N    Comments: Induction of labor due to blood pressue - was born at 6 months     Past Surgical History  Procedure Laterality Date  . No past surgeries     History   Social History  . Marital Status: Married    Spouse  Name: N/A  . Number of Children: N/A  . Years of Education: N/A   Occupational History  . Not on file.   Social History Main Topics  . Smoking status: Never Smoker   . Smokeless tobacco: Never Used  . Alcohol Use: No  . Drug Use: No  . Sexual Activity: Yes    Birth Control/ Protection: None   Other Topics Concern  . Not on file   Social History Narrative   No current facility-administered medications on file prior to encounter.   Current Outpatient Prescriptions on File Prior to Encounter  Medication Sig Dispense Refill  . docusate sodium (COLACE) 100 MG capsule Take 1 capsule (100 mg total) by mouth 2 (two) times daily as needed. 30 capsule 2  . ibuprofen (ADVIL,MOTRIN) 600 MG tablet Take 1 tablet (600 mg total) by mouth every 6 (six) hours. 30 tablet 0  . Prenatal Multivit-Min-Fe-FA (PRENATAL VITAMINS) 0.8 MG tablet Take 1 tablet by mouth daily. 30 tablet 12   No Known Allergies  Review of Systems  Constitutional: Negative for fever and chills.  Eyes: Negative for blurred vision.  Respiratory: Negative for cough.  Cardiovascular: Negative for chest pain.  Gastrointestinal: Negative for nausea, vomiting and abdominal pain.   No RUQ pain  Genitourinary: Negative for dysuria.  Musculoskeletal:   Nl DTRs, no peripheral edema  Neurological: Positive for dizziness and headaches.    OBJECTIVE Blood pressure 155/104,  pulse 70, temperature 98.7 F (37.1 C), temperature source Oral, resp. rate 18, currently breastfeeding.   oral temperature is 98.7 F (37.1 C). Her blood pressure is 151/97 and her pulse is 70. Her respiration is 18.   Patient Vitals for the past 24 hrs:  BP Temp Temp src Pulse Resp  12/24/14 1958 151/97 mmHg - - 70 -  12/24/14 1922 (!) 155/104 mmHg - - 70 -  12/24/14 1910 152/95 mmHg - - 70 -  12/24/14 1902 (!) 158/101 mmHg - - 68 -  12/24/14  1840 145/97 mmHg - - 66 -  12/24/14 1832 (!) 160/123 mmHg - - - -  12/24/14 1828 (!) 163/104 mmHg - - 65 -  12/24/14 1803 (!) 165/115 mmHg 98.7 F (37.1 C) Oral 68 18   GENERAL: Well-developed, well-nourished female in no acute distress.  HEART: normal rate RESP: normal effort GI: Abdomen soft, non-tender. Positive bowel sounds 4. MS: Nontender, no peripheral  NEURO: Alert and oriented x3  LAB RESULTS  Lab Results Last 24 Hours    Results for orders placed or performed during the hospital encounter of 12/24/14 (from the past 24 hour(s))  Urinalysis with microscopic Status: Abnormal   Collection Time: 12/24/14 6:14 PM  Result Value Ref Range   Color, Urine YELLOW YELLOW   APPearance CLEAR CLEAR   Specific Gravity, Urine <1.005 (L) 1.005 - 1.030   pH 7.0 5.0 - 8.0   Glucose, UA NEGATIVE NEGATIVE mg/dL   Hgb urine dipstick MODERATE (A) NEGATIVE   Bilirubin Urine NEGATIVE NEGATIVE   Ketones, ur NEGATIVE NEGATIVE mg/dL   Protein, ur NEGATIVE NEGATIVE mg/dL   Urobilinogen, UA 0.2 0.0 - 1.0 mg/dL   Nitrite NEGATIVE NEGATIVE   Leukocytes, UA NEGATIVE NEGATIVE   RBC / HPF 3-6 <3 RBC/hpf   Squamous Epithelial / LPF RARE RARE  Protein / creatinine ratio, urine Status: Abnormal   Collection Time: 12/24/14 6:14 PM  Result Value Ref Range   Creatinine, Urine 44.00 mg/dL   Total Protein, Urine 8 mg/dL   Protein Creatinine Ratio 0.18 (H) 0.00 - 0.15  Comprehensive metabolic panel Status: None   Collection Time: 12/24/14 6:30 PM  Result Value Ref Range   Sodium 138 135 - 145 mmol/L   Potassium 4.0 3.5 - 5.1 mmol/L   Chloride 105 96 - 112 mmol/L   CO2 24 19 - 32 mmol/L   Glucose, Bld 72 70 - 99 mg/dL   BUN 12 6 - 23 mg/dL   Creatinine, Ser 1.91 0.50 - 1.10 mg/dL   Calcium 8.7 8.4 - 47.8 mg/dL   Total Protein 7.1 6.0 - 8.3  g/dL   Albumin 3.8 3.5 - 5.2 g/dL   AST 24 0 - 37 U/L   ALT 34 0 - 35 U/L   Alkaline Phosphatase 99 39 - 117 U/L   Total Bilirubin 0.3 0.3 - 1.2 mg/dL   GFR calc non Af Amer >90 >90 mL/min   GFR calc Af Amer >90 >90 mL/min   Anion gap 9 5 - 15  CBC Status: None   Collection Time: 12/24/14 6:30 PM  Result Value Ref Range   WBC 8.6 4.0 - 10.5 K/uL   RBC 4.60 3.87 - 5.11 MIL/uL   Hemoglobin 14.0 12.0 - 15.0 g/dL   HCT 29.5 62.1 - 30.8 %   MCV 85.9 78.0 - 100.0 fL   MCH 30.4 26.0 - 34.0 pg   MCHC 35.4 30.0 - 36.0 g/dL   RDW 65.7 84.6 -  15.5 %   Platelets 337 150 - 400 K/uL      IMAGING  MAU COURSE Ordered IV Hydralazine for severe range BP's upn arrival, but BP's improved before any doses given. Pre-E labs drawn. IV placed.   Discussed BP's, Hx, nature of HA, Pre-E labs w/ Dr.Stinson. Will give Labetalol PO in MAU and Rx Amlodipine 5 mg PO QD at D/C and have pt return to clinic 12/27/14 for BP check.   Report given to Rochele PagesWalidah Karim, CNM at 2010. Pt awaiting Labetalol.   GodleyVirginia Smith, CNM 12/24/2014 8:24 PM                                           2225 Pt received 20 mg IV labetalol and 200 mg labetalol PO;   Filed Vitals:   12/24/14 2040 12/24/14 2101 12/24/14 2112 12/24/14 2130  BP: 153/104 144/92 159/105 144/97  Pulse: 74 76 80 82  Temp:      TempSrc:      Resp:       2228 Dr. Adrian BlackwaterStinson in to review blood pressures and patient status  > pt reports resolution in headache > discharge to home with added PO norvasc  A:   29 yo [id number] at 5 days postpartum Elevated Blood Pressures  P: Discharge to home RX Norvasc 5 mg Follow-up in office on Friday for blood pressure check  Marlis EdelsonWalidah N Karim, CNM

## 2014-12-27 ENCOUNTER — Ambulatory Visit: Payer: Medicaid Other | Admitting: *Deleted

## 2014-12-27 VITALS — BP 152/101 | HR 91 | Temp 98.7°F

## 2014-12-27 DIAGNOSIS — IMO0001 Reserved for inherently not codable concepts without codable children: Secondary | ICD-10-CM

## 2014-12-27 DIAGNOSIS — R030 Elevated blood-pressure reading, without diagnosis of hypertension: Principal | ICD-10-CM

## 2014-12-27 NOTE — Progress Notes (Signed)
Blood pressure elevated, Discussed with Dr. Debroah LoopArnold, new order. Norvasc 10 mg qd, return in 1 wk for blood pressure check.

## 2014-12-30 NOTE — Progress Notes (Signed)
Post discharge chart review completed.  

## 2015-01-06 ENCOUNTER — Ambulatory Visit: Payer: Medicaid Other | Admitting: *Deleted

## 2015-01-06 VITALS — BP 139/89 | HR 93 | Temp 98.3°F

## 2015-01-06 DIAGNOSIS — Z0130 Encounter for examination of blood pressure without abnormal findings: Secondary | ICD-10-CM

## 2015-01-06 NOTE — Progress Notes (Signed)
Pt complains of headache every afternoon.

## 2015-01-06 NOTE — Progress Notes (Signed)
Spoke with Dr. Macon LargeAnyanwu, pt to stop norvasc medication, recheck blood pressure if elevated contact clinic. [place] Interpreter

## 2015-01-06 NOTE — Addendum Note (Signed)
Addended by: Candelaria StagersHAIZLIP, Armina Galloway E on: 01/06/2015 11:31 AM   Modules accepted: Level of Service

## 2015-01-23 ENCOUNTER — Ambulatory Visit (INDEPENDENT_AMBULATORY_CARE_PROVIDER_SITE_OTHER): Payer: Medicaid Other | Admitting: Family Medicine

## 2015-01-23 ENCOUNTER — Encounter: Payer: Self-pay | Admitting: Family Medicine

## 2015-01-23 VITALS — BP 152/92 | HR 92 | Wt 144.7 lb

## 2015-01-23 DIAGNOSIS — Z3049 Encounter for surveillance of other contraceptives: Secondary | ICD-10-CM

## 2015-01-23 DIAGNOSIS — Z30013 Encounter for initial prescription of injectable contraceptive: Secondary | ICD-10-CM

## 2015-01-23 LAB — POCT PREGNANCY, URINE: PREG TEST UR: NEGATIVE

## 2015-01-23 MED ORDER — HYDROCHLOROTHIAZIDE 12.5 MG PO CAPS: 12.5000 mg | ORAL_CAPSULE | Freq: Every day | ORAL | Status: DC

## 2015-01-23 MED ORDER — ETONOGESTREL 68 MG ~~LOC~~ IMPL
68.0000 mg | DRUG_IMPLANT | Freq: Once | SUBCUTANEOUS | Status: AC
  Administered 2015-01-23: 68 mg via SUBCUTANEOUS

## 2015-01-23 NOTE — Progress Notes (Signed)
Subjective:     Rosamaria LintsChantal Tramble is a 29 y.o. female who presents for a postpartum visit. She is 5 weeks postpartum following a spontaneous vaginal delivery. I have fully reviewed the prenatal and intrapartum course. The delivery was at 39 gestational weeks. Outcome: spontaneous vaginal delivery. Anesthesia: none. Postpartum course has been complicated by elevated blood pressures, hx of cHTN vs gHTN. Baby's course has been uncomplicated. Baby is feeding by breast. Bleeding no bleeding. Bowel function is normal. Bladder function is normal. Patient is not sexually active. Contraception method is to be nexplanon. Postpartum depression screening: negative.  Blood pressure log reviewed, no severe range BP.  Started on Norvasc 10mg , did not tolerate, then d/c'd  The following portions of the patient's history were reviewed and updated as appropriate: allergies, current medications, past family history, past medical history, past social history, past surgical history and problem list.  Review of Systems Pertinent items are noted in HPI.   Objective:  BP 152/92 mmHg  Pulse 92  Wt 144 lb 11.2 oz (65.635 kg)  Breastfeeding? Yes  General:  alert and cooperative  Lungs: clear to auscultation bilaterally and normal percussion bilaterally  Heart:  regular rate and rhythm, S1, S2 normal, no murmur, click, rub or gallop  Abdomen: soft, non-tender; bowel sounds normal; no masses,  no organomegaly   Vulva:  not evaluated  Vagina: not evaluated       DTR 1+, no clonus   Risks/benefits/side effects of Nexplanon have been discussed and her questions have been answered.  Specifically, a failure rate of 09/998 has been reported, with an increased failure rate if pt takes St. John's Wort and/or antiseizure medicaitons.  Rosamaria LintsChantal Chretien is aware of the common side effect of irregular bleeding, which the incidence of decreases over time.  Her left arm, approximatly 4 inches proximal from the elbow, was cleansed  with alcohol and anesthetized with 2cc of 3% Lidocaine.  The area was cleansed again and the Nexplanon was inserted without difficulty.  A pressure bandage was applied.  Pt was instructed to remove pressure bandage in a few hours, and keep insertion site covered with a bandaid for 3 days.  Back up contraception was recommended for 3 weeks.  Follow-up scheduled PRN problems   Assessment:     postpartum exam with suspected chronic hypertension. Pap smear not done at today's visit.   Plan:    1. Contraception: Nexplanon 2. CHTN: with HA each night, d/c ibuprofen as may be worsening BP, try tylenol prn, start HCTZ daily, RTC 1 week for BP check 3. Follow up in: 1 week or as needed.

## 2015-02-03 ENCOUNTER — Ambulatory Visit: Payer: Medicaid Other | Admitting: *Deleted

## 2015-02-03 VITALS — BP 142/93 | HR 73

## 2015-02-03 DIAGNOSIS — IMO0001 Reserved for inherently not codable concepts without codable children: Secondary | ICD-10-CM

## 2015-02-03 DIAGNOSIS — R030 Elevated blood-pressure reading, without diagnosis of hypertension: Principal | ICD-10-CM

## 2015-02-03 MED ORDER — HYDROCHLOROTHIAZIDE 12.5 MG PO CAPS: 25.0000 mg | ORAL_CAPSULE | Freq: Every day | ORAL | Status: DC

## 2015-02-03 NOTE — Progress Notes (Signed)
Pt is to increase HCTZ to 25 mg per day/follow up with PCP. [facility]

## 2015-02-12 ENCOUNTER — Ambulatory Visit: Payer: Medicaid Other | Admitting: Family Medicine

## 2015-05-23 ENCOUNTER — Encounter (HOSPITAL_COMMUNITY): Payer: Self-pay

## 2015-05-23 ENCOUNTER — Emergency Department (INDEPENDENT_AMBULATORY_CARE_PROVIDER_SITE_OTHER)
Admission: EM | Admit: 2015-05-23 | Discharge: 2015-05-23 | Disposition: A | Discharge status: Home / Self Care | Payer: Self-pay | Source: Home / Self Care | Attending: Family Medicine | Admitting: Family Medicine

## 2015-05-23 DIAGNOSIS — I158 Other secondary hypertension: Secondary | ICD-10-CM

## 2015-05-23 DIAGNOSIS — G8929 Other chronic pain: Secondary | ICD-10-CM

## 2015-05-23 DIAGNOSIS — R42 Dizziness and giddiness: Secondary | ICD-10-CM

## 2015-05-23 DIAGNOSIS — R51 Headache: Secondary | ICD-10-CM

## 2015-05-23 MED ORDER — ACETAMINOPHEN 325 MG PO TABS
ORAL_TABLET | ORAL | Status: AC
  Filled 2015-05-23: qty 2

## 2015-05-23 MED ORDER — ACETAMINOPHEN 325 MG PO TABS
650.0000 mg | ORAL_TABLET | Freq: Once | ORAL | Status: AC
  Administered 2015-05-23: 650 mg via ORAL

## 2015-05-23 NOTE — Discharge Instructions (Signed)
Dizziness You must call your doctor Monday morning. You must let them know that your blood pressure is elevated and you have been asked to see them as soon as possible. You are also advised to have your headaches evaluated. It is recommended that if you have dizziness use do not drive or operate machinery.  Dizziness means you feel unsteady or lightheaded. You might feel like you are going to pass out (faint). HOME CARE   Drink enough fluids to keep your pee (urine) clear or pale yellow.  Take your medicines exactly as told by your doctor. If you take blood pressure medicine, always stand up slowly from the lying or sitting position. Hold on to something to steady yourself.  If you need to stand in one place for a long time, move your legs often. Tighten and relax your leg muscles.  Have someone stay with you until you feel okay.  Do not drive or use heavy machinery if you feel dizzy.  Do not drink alcohol. GET HELP RIGHT AWAY IF:   You feel dizzy or lightheaded and it gets worse.  You feel sick to your stomach (nauseous), or you throw up (vomit).  You have trouble talking or walking.  You feel weak or have trouble using your arms, hands, or legs.  You cannot think clearly or have trouble forming sentences.  You have chest pain, belly (abdominal) pain, sweating, or you are short of breath.  Your vision changes.  You are bleeding.  You have problems from your medicine that seem to be getting worse. MAKE SURE YOU:   Understand these instructions.  Will watch your condition.  Will get help right away if you are not doing well or get worse. Document Released: 09/02/2011 Document Revised: 12/06/2011 Document Reviewed: 09/02/2011 [facility] Patient Information 2015 [place], [place]. This information is not intended to replace advice given to you by your health care provider. Make sure you discuss any questions you have with your health care provider.  General Headache Without  Cause A headache is pain or discomfort felt around the head or neck area. The specific cause of a headache may not be found. There are many causes and types of headaches. A few common ones are:  Tension headaches.  Migraine headaches.  Cluster headaches.  Chronic daily headaches. HOME CARE INSTRUCTIONS   Keep all follow-up appointments with your caregiver or any specialist referral.  Only take over-the-counter or prescription medicines for pain or discomfort as directed by your caregiver.  Lie down in a dark, quiet room when you have a headache.  Keep a headache journal to find out what may trigger your migraine headaches. For example, write down:  What you eat and drink.  How much sleep you get.  Any change to your diet or medicines.  Try massage or other relaxation techniques.  Put ice packs or heat on the head and neck. Use these 3 to 4 times per day for 15 to 20 minutes each time, or as needed.  Limit stress.  Sit up straight, and do not tense your muscles.  Quit smoking if you smoke.  Limit alcohol use.  Decrease the amount of caffeine you drink, or stop drinking caffeine.  Eat and sleep on a regular schedule.  Get 7 to 9 hours of sleep, or as recommended by your caregiver.  Keep lights dim if bright lights bother you and make your headaches worse. SEEK MEDICAL CARE IF:   You have problems with the medicines you were prescribed.  Your medicines are not working.  You have a change from the usual headache.  You have nausea or vomiting. SEEK IMMEDIATE MEDICAL CARE IF:   Your headache becomes severe.  You have a fever.  You have a stiff neck.  You have loss of vision.  You have muscular weakness or loss of muscle control.  You start losing your balance or have trouble walking.  You feel faint or pass out.  You have severe symptoms that are different from your first symptoms. MAKE SURE YOU:   Understand these instructions.  Will watch your  condition.  Will get help right away if you are not doing well or get worse. Document Released: 09/13/2005 Document Revised: 12/06/2011 Document Reviewed: 09/29/2011 [facility] Patient Information 2015 [place], [place]. This information is not intended to replace advice given to you by your health care provider. Make sure you discuss any questions you have with your health care provider.

## 2015-05-23 NOTE — ED Provider Notes (Signed)
CSN: [id number]     Arrival date & time 05/23/15  1841 History   First MD Initiated Contact with Patient 05/23/15 1916     Chief Complaint  Patient presents with  . Hypertension  . Dizziness  . Headache   (Consider location/radiation/quality/duration/timing/severity/associated sxs/prior Treatment) HPI Comments: 29 year old female with a several year history of chronic headaches often occurring daily and other times occurring 3-4 times a month presents with headache associated with dizziness and low back pain. The headache started yesterday as did the dizziness. She has had pain in the back for 2-3 days. The pain in the back is worse with movement and palpation. She recently delivered a viable infant. She was a high-risk pregnancy with hypertension. With each pregnancy she has had symptoms of headaches and elevated blood pressure. Her headaches are documented in past notes and often associated with dizziness. The patient states that this headache is no different from previous headaches. She usually takes Tylenol but did not take one today. She denies problems with vision speech hearing or swallowing. Denies problems with focal paresthesias or weakness. Denies photophobia, nausea or vomiting. She is holding her newborn in her lap. She is currently breast-feeding. Patient is a 29 y.o. female presenting with headaches.  Headache Pain location:  Generalized Quality:  Dull Radiates to:  Does not radiate Severity currently:  Unable to specify Severity at highest:  Unable to specify Onset quality:  Gradual Duration:  1 day Timing:  Intermittent Progression:  Unchanged Chronicity:  Chronic Similar to prior headaches: yes   Context: not activity and not exposure to bright light   Relieved by:  Acetaminophen Worsened by:  Activity Ineffective treatments:  None tried Associated symptoms: back pain, dizziness and myalgias   Associated symptoms: no abdominal pain, no congestion, no cough, no eye pain,  no fever, no nausea, no neck pain, no numbness, no photophobia, no seizures, no sinus pressure, no syncope, no visual change and no vomiting     Past Medical History  Diagnosis Date  . Gestational HTN   . Pregnancy induced hypertension   . Chronic hypertension in pregnancy 03/29/2013    Start antenatal testing at 37 weeks.     Past Surgical History  Procedure Laterality Date  . No past surgeries     Family History  Problem Relation Age of Onset  . Hypertension Father   . Alcohol abuse Neg Hx   . Arthritis Neg Hx   . Asthma Neg Hx   . Birth defects Neg Hx   . Cancer Neg Hx   . COPD Neg Hx   . Depression Neg Hx   . Diabetes Neg Hx   . Drug abuse Neg Hx   . Early death Neg Hx   . Hearing loss Neg Hx   . Heart disease Neg Hx   . Hyperlipidemia Neg Hx   . Kidney disease Neg Hx   . Learning disabilities Neg Hx   . Mental illness Neg Hx   . Mental retardation Neg Hx   . Miscarriages / Stillbirths Neg Hx   . Stroke Neg Hx   . Vision loss Neg Hx    Social History  Substance Use Topics  . Smoking status: Never Smoker   . Smokeless tobacco: Never Used  . Alcohol Use: No   OB History    Gravida Para Term Preterm AB TAB SAB Ectopic Multiple Living   4 4 3 1  0 0   0 3     Review of Systems  Constitutional: Negative for fever, chills and activity change.  HENT: Negative for congestion, rhinorrhea, sinus pressure and trouble swallowing.   Eyes: Negative for photophobia, pain, discharge, redness and visual disturbance.  Respiratory: Negative for cough and shortness of breath.   Cardiovascular: Negative for chest pain, palpitations and syncope.  Gastrointestinal: Negative for nausea, vomiting and abdominal pain.  Genitourinary: Negative.   Musculoskeletal: Positive for myalgias and back pain. Negative for neck pain.  Skin: Negative.   Neurological: Positive for dizziness and headaches. Negative for seizures, syncope, facial asymmetry, speech difficulty and numbness.     Allergies  Review of patient's allergies indicates no known allergies.  Home Medications   Prior to Admission medications   Not on File   Meds Ordered and Administered this Visit   Medications  acetaminophen (TYLENOL) tablet 650 mg (not administered)    BP 155/102 mmHg  Pulse 84  Temp(Src) 99.5 F (37.5 C) (Oral) No data found.   Physical Exam  Constitutional: She is oriented to person, place, and time. She appears well-developed and well-nourished. No distress.  HENT:  Head: Normocephalic and atraumatic.  Mouth/Throat: Oropharynx is clear and moist. No oropharyngeal exudate.  Eyes: Conjunctivae and EOM are normal. Pupils are equal, round, and reactive to light.  Neck: Normal range of motion. Neck supple.  Cardiovascular: Normal rate, normal heart sounds and intact distal pulses.   Pulmonary/Chest: Effort normal and breath sounds normal. No respiratory distress. She has no wheezes. She has no rales.  Abdominal: Soft. There is no tenderness.  Musculoskeletal: Normal range of motion. She exhibits edema.  Lymphadenopathy:    She has no cervical adenopathy.  Neurological: She is alert and oriented to person, place, and time. She has normal strength. No cranial nerve deficit or sensory deficit. She exhibits normal muscle tone.  Skin: Skin is warm and dry.  Psychiatric: She has a normal mood and affect.  Nursing note and vitals reviewed.   ED Course  Procedures (including critical care time)  Labs Review Labs Reviewed - No data to display  Imaging Review No results found.   Visual Acuity Review  Right Eye Distance:   Left Eye Distance:   Bilateral Distance:    Right Eye Near:   Left Eye Near:    Bilateral Near:         MDM   1. Other secondary hypertension   2. Chronic nonintractable headache, unspecified headache type   3. Dizziness    the patient has a history of chronic daily headaches or headaches that occur every week to every other week.  These headaches have been occurring for at least 2-3 years. She has been seen by physicians for these headaches. And as above there are no symptoms that are occurring now that she has not had before. These usually last approximately one day. She has also had chronic hypertension. She has been taken off her medications from previous positions shortly after   her recent pregnancy. She is advised to take Tylenol as needed for headache and she will be given 650 mg now prior to discharge.  You must call your doctor Monday morning. You must let them know that your blood pressure is elevated and you have been asked to see them as soon as possible. You are also advised to have your headaches evaluated. It is recommended that if you have dizziness use do not drive or operate machinery.      Hayden Rasmussen, NP 05/23/15 1951  Hayden Rasmussen, NP 05/23/15 [phone]

## 2015-05-23 NOTE — ED Notes (Addendum)
Has not taken her BP medication x 1 month, ran out. Has 29 month old child, and states she started on the medication after the baby was born. Presented w slurred speech, stumbling gait. DROVE HERSELF TO THE [facility]

## 2015-05-23 NOTE — ED Notes (Signed)
Advised Carrie Duncan of patient presenting c/o VS, general condition

## 2015-06-24 LAB — GLUCOSE, POCT (MANUAL RESULT ENTRY): POC GLUCOSE: 63 mg/dL — AB (ref 70–99)

## 2015-07-31 ENCOUNTER — Encounter: Payer: Self-pay | Admitting: *Deleted

## 2016-08-23 ENCOUNTER — Ambulatory Visit (INDEPENDENT_AMBULATORY_CARE_PROVIDER_SITE_OTHER): Payer: Self-pay | Admitting: Internal Medicine

## 2016-08-23 ENCOUNTER — Encounter: Payer: Self-pay | Admitting: Internal Medicine

## 2016-08-23 VITALS — BP 122/82 | HR 64 | Resp 12 | Ht 60.0 in | Wt 143.0 lb

## 2016-08-23 DIAGNOSIS — Z79899 Other long term (current) drug therapy: Secondary | ICD-10-CM

## 2016-08-23 DIAGNOSIS — M545 Low back pain: Secondary | ICD-10-CM

## 2016-08-23 DIAGNOSIS — M7061 Trochanteric bursitis, right hip: Secondary | ICD-10-CM

## 2016-08-23 DIAGNOSIS — Z23 Encounter for immunization: Secondary | ICD-10-CM

## 2016-08-23 DIAGNOSIS — B351 Tinea unguium: Secondary | ICD-10-CM

## 2016-08-23 DIAGNOSIS — G8929 Other chronic pain: Secondary | ICD-10-CM

## 2016-08-23 MED ORDER — NAPROXEN 500 MG PO TABS: 500.0000 mg | ORAL_TABLET | Freq: Two times a day (BID) | ORAL | 2 refills | Status: DC
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
## 2016-08-23 NOTE — Patient Instructions (Signed)
Terbinafine Cream 0.1 % and apply twice daily to and around thumbnail

## 2016-08-24 LAB — COMPREHENSIVE METABOLIC PANEL
A/G RATIO: 1.8 (ref 1.2–2.2)
ALBUMIN: 4.6 g/dL (ref 3.5–5.5)
ALT: 8 IU/L (ref 0–32)
AST: 14 IU/L (ref 0–40)
Alkaline Phosphatase: 62 IU/L (ref 39–117)
BUN / CREAT RATIO: 16 (ref 9–23)
BUN: 11 mg/dL (ref 6–20)
Bilirubin Total: 0.3 mg/dL (ref 0.0–1.2)
CALCIUM: 9.3 mg/dL (ref 8.7–10.2)
CO2: 23 mmol/L (ref 18–29)
CREATININE: 0.67 mg/dL (ref 0.57–1.00)
Chloride: 105 mmol/L (ref 96–106)
GFR, EST AFRICAN AMERICAN: 136 mL/min/{1.73_m2} (ref >59)
GFR, EST NON AFRICAN AMERICAN: 118 mL/min/{1.73_m2} (ref >59)
GLOBULIN, TOTAL: 2.6 g/dL (ref 1.5–4.5)
Glucose: 75 mg/dL (ref 65–99)
POTASSIUM: 4.1 mmol/L (ref 3.5–5.2)
SODIUM: 144 mmol/L (ref 134–144)
Total Protein: 7.2 g/dL (ref 6.0–8.5)

## 2016-08-25 NOTE — Progress Notes (Signed)
Spoke with patient. Discussed labs

## 2016-08-30 ENCOUNTER — Other Ambulatory Visit: Payer: Self-pay | Admitting: Licensed Clinical Social Worker

## 2016-09-28 ENCOUNTER — Other Ambulatory Visit: Payer: Self-pay | Admitting: Licensed Clinical Social Worker

## 2016-10-19 IMAGING — US US OB FOLLOW-UP
1 series · 12 of 28 positions shown · non-contrast
Comparison: none

[Series 1: us ob follow-up · 0.21mm/px · 12 of 36 slices shown]
[im 2/36]
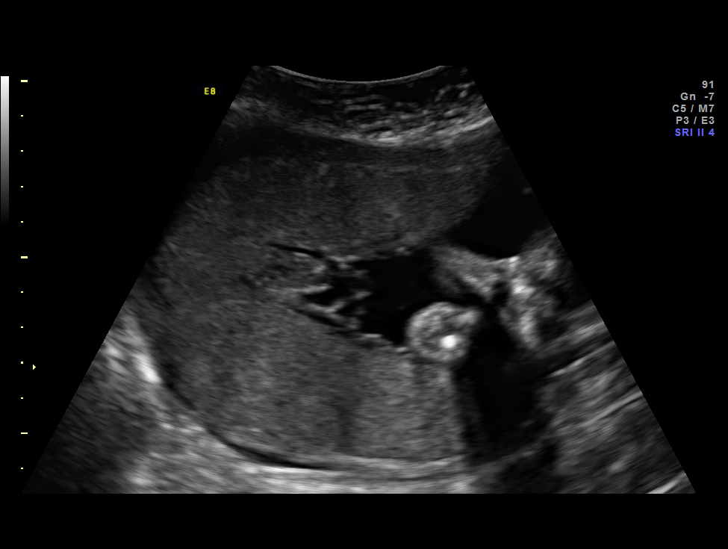
[im 4/36]
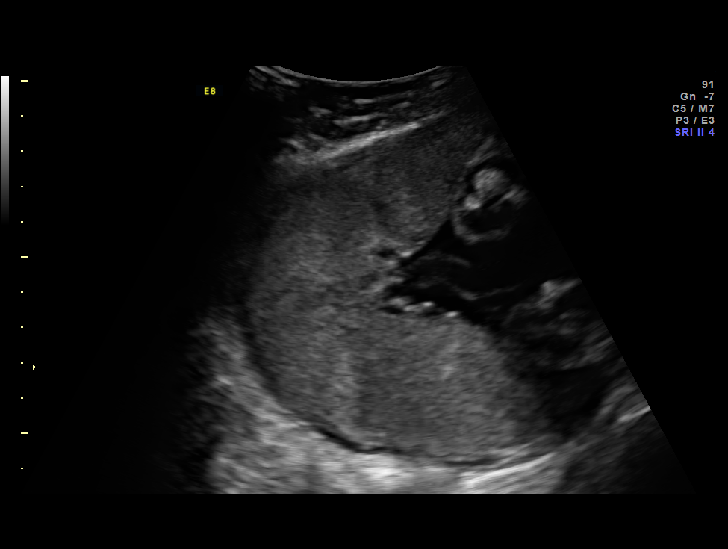
[im 7/36]
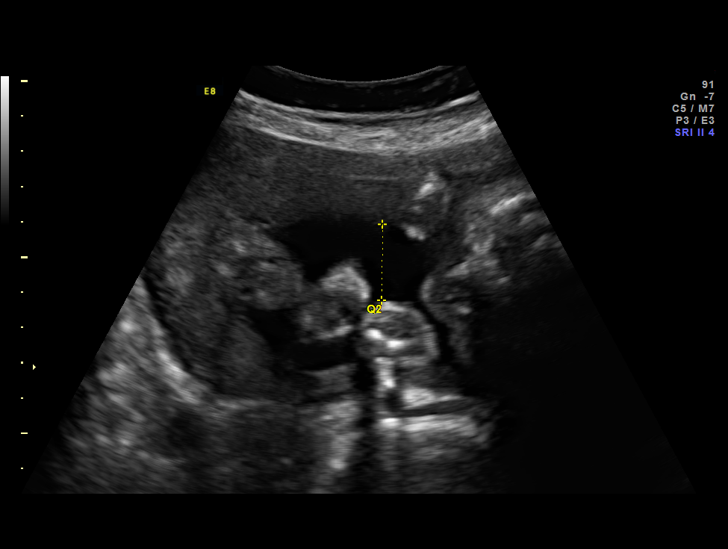
[im 11/36]
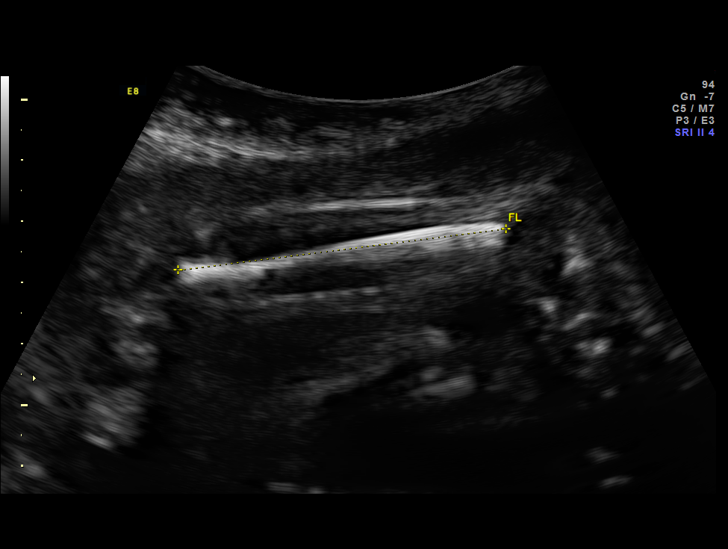
[im 13/36]
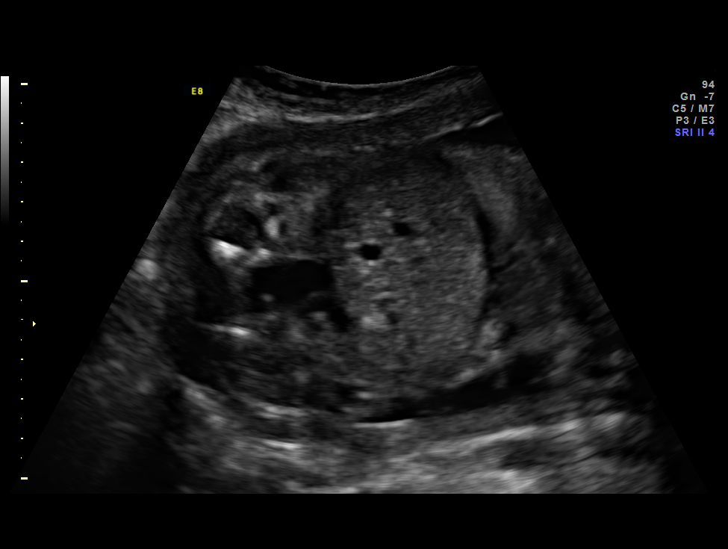
[im 16/36]
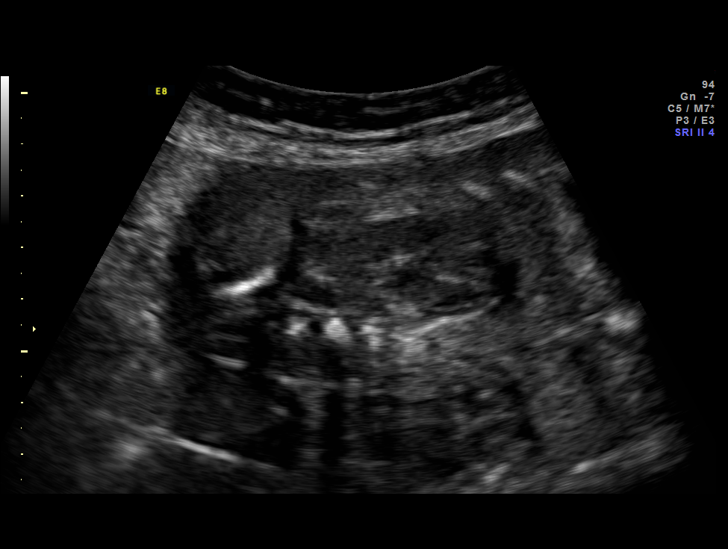
[im 20/36]
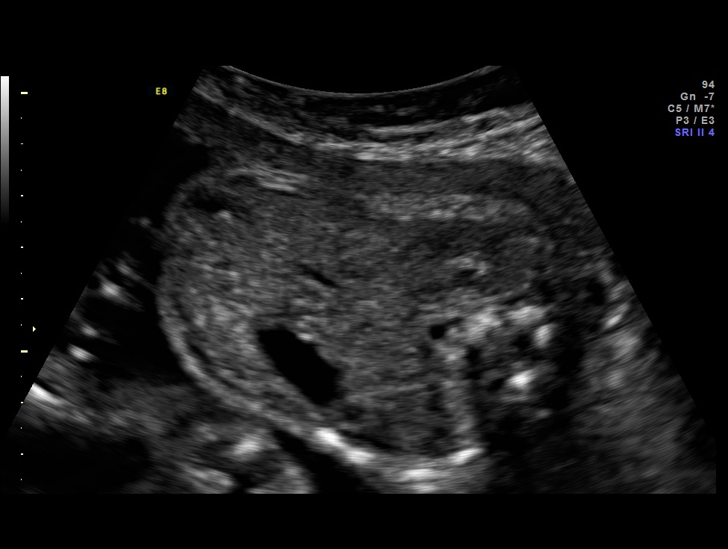
[im 23/36]
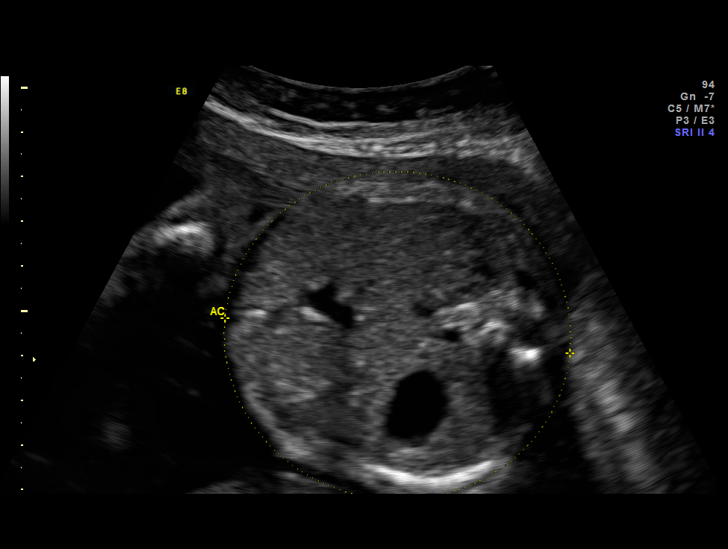
[im 25/36]
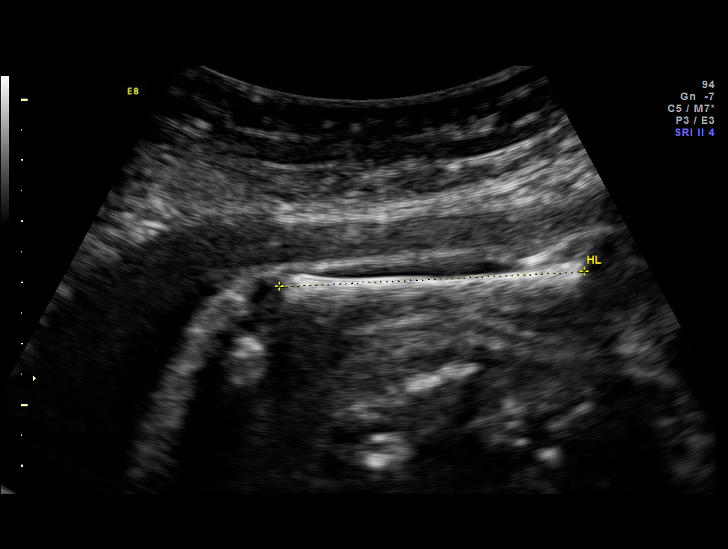
[im 29/36]
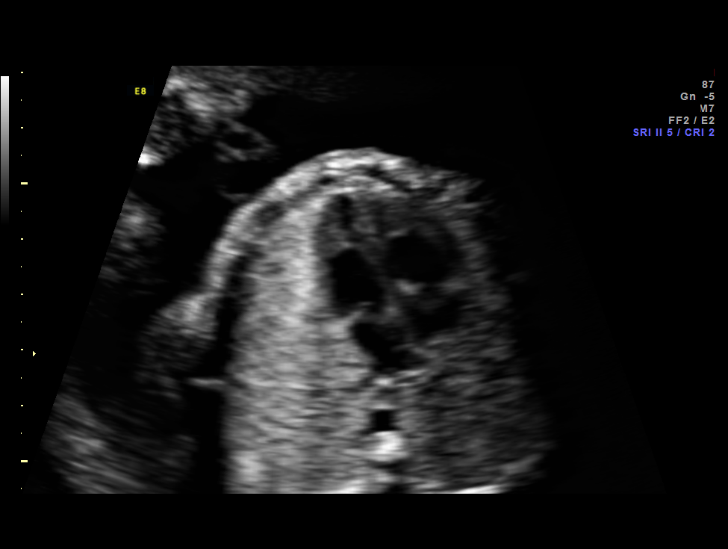
[im 32/36]
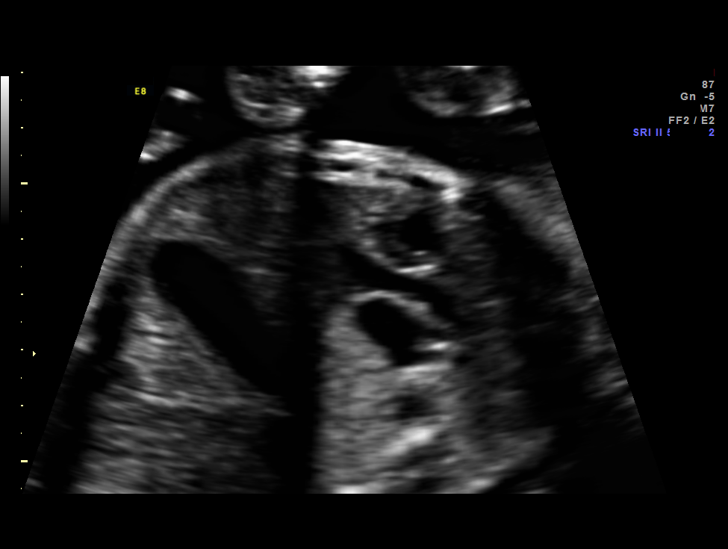
[im 34/36]
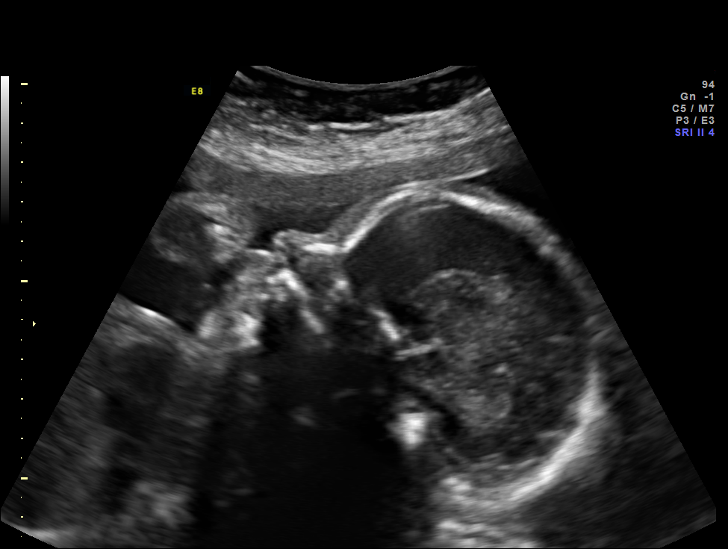

[12 of 28 positions shown; findings below may reference images not displayed]

OBSTETRICS REPORT
                      (Signed Final 10/09/2014 [DATE])

Service(s) Provided

 US OB FOLLOW UP                                       76816.1
Indications

 Poor obstetric history: Previous preeclampsia /
 eclampsia/gestational HTN x 2
 Poor obstetric history: Previous preterm delivery @
 26 weeks induced in [place] due to PIH
 Sickle Cell Trait
 2 vessel umbilical cord
 28 weeks gestation of pregnancy
Fetal Evaluation

 Num Of Fetuses:    1
 Fetal Heart Rate:  134                          bpm
 Cardiac Activity:  Observed
 Presentation:      Cephalic
 Placenta:          Right lateral, above
                    cervical os
 P. Cord            Previously Visualized
 Insertion:

 Amniotic Fluid
 AFI FV:      Subjectively within normal limits
 AFI Sum:     12.47   cm       33  %Tile     Larg Pckt:    3.96  cm
 RUQ:   3.96    cm   RLQ:    2.73   cm    LUQ:   2.16    cm   LLQ:    3.62   cm
Biometry

 BPD:     70.1  mm     G. Age:  28w 1d                CI:         73.5   70 - 86
 OFD:     95.4  mm                                    FL/HC:      20.1   19.6 -

 HC:     267.8  mm     G. Age:  29w 1d       27  %    HC/AC:      1.13   0.99 -

 AC:     236.1  mm     G. Age:  27w 6d       19  %    FL/BPD:     76.7   71 - 87
 FL:      53.8  mm     G. Age:  28w 4d       25  %    FL/AC:      22.8   20 - 24
 HUM:     49.9  mm     G. Age:  29w 2d       52  %

 Est. FW:    4455  gm    2 lb 10 oz      39  %
Gestational Age

 LMP:           28w 6d        Date:  03/21/14                 EDD:   12/26/14
 U/S Today:     28w 3d                                        EDD:   12/29/14
 Best:          28w 6d     Det. By:  LMP  (03/21/14)          EDD:   12/26/14
Anatomy

 Cranium:          Previously seen        Aortic Arch:      Previously seen
 Fetal Cavum:      Previously seen        Ductal Arch:      Previously seen
 Ventricles:       Appears normal         Diaphragm:        Appears normal
 Choroid Plexus:   Previously seen        Stomach:          Appears normal, left
                                                            sided
 Cerebellum:       Previously seen        Abdomen:          Previously seen
 Posterior Fossa:  Previously seen        Abdominal Wall:   Previously seen
 Nuchal Fold:      Previously seen        Cord Vessels:     2 Vessel Cord
 Face:             Orbits and profile     Kidneys:          Appear normal
                   previously seen
 Lips:             Previously seen        Bladder:          Appears normal
 Heart:            Appears normal         Spine:            Previously seen
                   (4CH, axis, and
                   situs)
 RVOT:             Appears normal         Lower             Previously seen
                                          Extremities:
 LVOT:             Appears normal         Upper             Previously seen
                                          Extremities:

 Other:  Male gender. Heels and 5th digit previously seen.
Cervix Uterus Adnexa

 Cervix:       Not visualized (advanced GA >84wks)
Impression

 Single IUP at 28w 6d
 Follow up due to single umbilical artery, CHTN, hx of previous
 preterm delivery at 26 weeks due to? preeclampsia
 A single umbilical artery is again noted (isolated finding)
 Fetal growth is appropriate (39th %tile)
 Normal amniotic fluid volume
Recommendations

 Recommend follow-up ultrasound examination in 4 weeks for
 interval growth

## 2016-10-26 ENCOUNTER — Ambulatory Visit (INDEPENDENT_AMBULATORY_CARE_PROVIDER_SITE_OTHER): Payer: Self-pay | Admitting: Internal Medicine

## 2016-10-26 ENCOUNTER — Encounter: Payer: Self-pay | Admitting: Internal Medicine

## 2016-10-26 VITALS — BP 122/90 | HR 71 | Temp 98.0°F | Resp 14 | Ht 61.5 in | Wt 139.0 lb

## 2016-10-26 DIAGNOSIS — J3089 Other allergic rhinitis: Secondary | ICD-10-CM

## 2016-10-26 DIAGNOSIS — B351 Tinea unguium: Secondary | ICD-10-CM

## 2016-10-26 MED ORDER — TERBINAFINE HCL 250 MG PO TABS: 250.0000 mg | ORAL_TABLET | Freq: Every day | ORAL | 0 refills | Status: DC

## 2016-10-26 MED ORDER — CETIRIZINE HCL 10 MG PO TABS: 10.0000 mg | ORAL_TABLET | Freq: Every day | ORAL | 11 refills | Status: DC

## 2016-10-26 NOTE — Patient Instructions (Signed)
Do not start Terbinafine if you are not stopping breast feeding.

## 2016-10-26 NOTE — Progress Notes (Signed)
   Subjective:    Patient ID: Carrie Duncan, female    DOB: [birth year], 31 y.o.   MRN: [id number]  HPI   1.  Onychomycosis of both toenails and fingernails:  Did not realize she was to pick up an Rx for Terbinafine, though wasn not called in.  Liver enzymes were normal in November.  She has only been using Lamisil topically with minimal success.  2.  Dizziness and headache for about 6 days:  Feels it is because her blood pressure is high.  States when she has this and goes to ED, that's what she is told.  Describes dizziness as a balance issue or like the room is spinning.  No associated nausea or vomiting.  No congestion, no cough, though her ears hurt.   Has been sneezing, but no nasal itching.  Does state eyes itch as well.  Throat was itching and irritated over the weekend, but fine now. No fevers.   Denies mold or mildew issues in her home.  No insect issues as well. Has not taken any medication for the issue. States better today than last week.  Current Meds  Medication Sig  . etonogestrel (NEXPLANON) 68 MG IMPL implant 1 each by Subdermal route once.   No Known Allergies   Review of Systems     Objective:   Physical Exam  NAD HEENT:  PERRL, EOMI, mild conjunctival injection, discs sharp, TMs pearly gray, throat without injection. Neck:  Supple, no adenopathy Chest:  CTA CV:  RRR without murmur or rub, radial pulses normal and equal. Neuro:  A & O x 3, CN II-XII grossly intact, DTRs 2+/4 throughout, Motor 5/5 throughout, sensory to light touch normal.  Finger-nose-finger, rapid alternating motions, Romberg all normal or negative.  Gait normal.  Current Meds  Medication Sig  . etonogestrel (NEXPLANON) 68 MG IMPL implant 1 each by Subdermal route once.   No Known Allergies      Assessment & Plan:  1.  Fingernail and Toenail onychomycosis:  Patient breast feeding in room just prior to discharge.  States she will discontinue as her child is 31 years old and wants to  clear her nail fungus.  Discussed with her she cannot start the Terbinafine 250 mg daily for 90 days until she is done breast feeding. Discussed regular treatment of shoes and cleaning shower floor regular to prevent reinfection.  2.  Allergies or URI and likely inner ear involvement:  Zyrtec 10 mg daily.

## 2016-10-27 DIAGNOSIS — B351 Tinea unguium: Secondary | ICD-10-CM | POA: Insufficient documentation

## 2016-11-16 IMAGING — US US OB FOLLOW-UP
1 series · 12 of 28 positions shown · non-contrast
Comparison: none

OBSTETRICS REPORT
                      (Signed Final 11/06/2014 [DATE])

Service(s) Provided
 US OB FOLLOW UP                                       76816.1
Indications
 Poor obstetric history: Previous preeclampsia /
 eclampsia/gestational HTN x 2
 Poor obstetric history: Previous preterm delivery @
 26 weeks induced in [place] due to PIH
 Sickle Cell Trait
 2 vessel umbilical cord
 32 weeks gestation of pregnancy
Fetal Evaluation
 Num Of Fetuses:    1
 Fetal Heart Rate:  142                          bpm
 Cardiac Activity:  Observed
 Presentation:      Cephalic
 Placenta:          Right lateral, above
                    cervical os
 P. Cord            Visualized, central
 Insertion:
 Amniotic Fluid
 AFI FV:      Subjectively within normal limits
 AFI Sum:     9.54    cm       14  %Tile     Larg Pckt:    3.68  cm
 RUQ:   0       cm   RLQ:    3.14   cm    LUQ:   3.68    cm   LLQ:    2.72   cm
Biometry
 BPD:     79.1  mm     G. Age:  31w 5d                CI:         73.6   70 - 86
 OFD:    107.4  mm                                    FL/HC:      20.9   19.9 -
 HC:     298.1  mm     G. Age:  33w 0d       19  %    HC/AC:      1.04   0.96 -
 AC:       288  mm     G. Age:  32w 6d       49  %    FL/BPD:     78.6   71 - 87
 FL:      62.2  mm     G. Age:  32w 1d       23  %    FL/AC:      21.6   20 - 24
 HUM:     56.6  mm     G. Age:  32w 6d       58  %
 Est. FW:    6229  gm      4 lb 7 oz     52  %
Gestational Age
 LMP:           32w 6d        Date:  03/21/14                 EDD:   12/26/14
 U/S Today:     32w 3d                                        EDD:   12/29/14
 Best:          32w 6d     Det. By:  LMP  (03/21/14)          EDD:   12/26/14
Anatomy
 Cranium:          Previously seen        Aortic Arch:      Previously seen
 Fetal Cavum:      Previously seen        Ductal Arch:      Previously seen
 Ventricles:       Appears normal         Diaphragm:        Appears normal
 Choroid Plexus:   Previously seen        Stomach:          Appears normal, left
                                                            sided
 Cerebellum:       Previously seen        Abdomen:          Previously seen
 Posterior Fossa:  Previously seen        Abdominal Wall:   Previously seen
 Nuchal Fold:      Previously seen        Cord Vessels:     2 Vessel Cord
 Face:             Orbits and profile     Kidneys:          Appear normal
                   previously seen
 Lips:             Previously seen        Bladder:          Appears normal
 Heart:            Appears normal         Spine:            Previously seen
                   (4CH, axis, and
                   situs)
 RVOT:             Previously seen        Lower             Previously seen
                                          Extremities:
 LVOT:             Previously seen        Upper             Previously seen
 Other:  Male gender. Heels and 5th digit previously seen.
Cervix Uterus Adnexa
 Cervix:       Not visualized (advanced GA >95wks)
 Uterus:       No abnormality visualized.
 Cul De Sac:   No free fluid seen.
 Left Ovary:    No adnexal mass visualized.
 Right Ovary:   No adnexal mass visualized.
 Adnexa:     No adnexal mass visualized.
Impression
INDICATION: 28 yr old [id number] at 97w6d with chronic
 hypertension, history of preeclampsia, and fetus with single
 umbilical artery for fetal growth.

[Series 1: us ob follow-up · 0.23mm/px · 43 acquisitions, 12 frames shown]
[im 2/43]
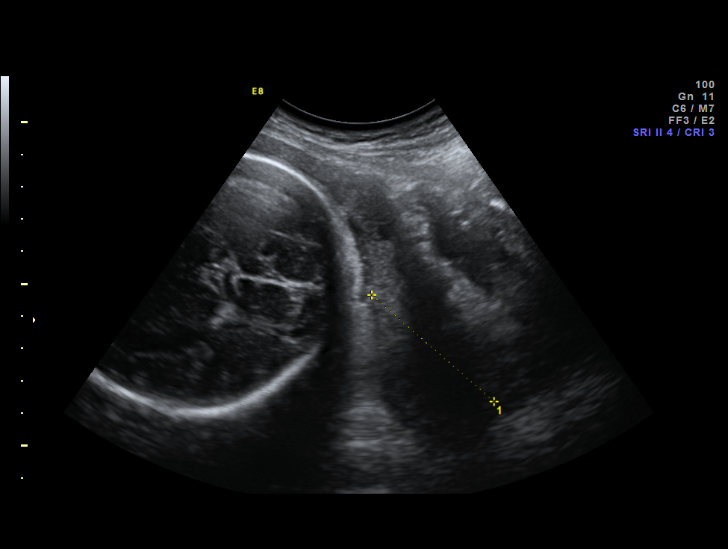
[im 5/43]
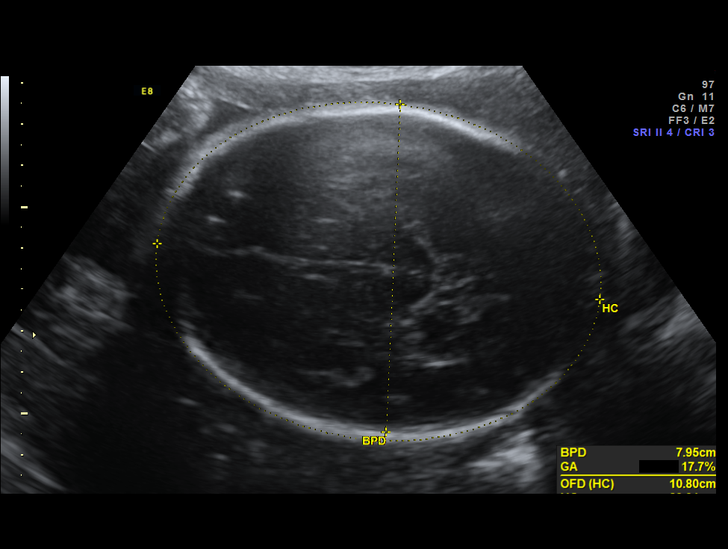
[im 8/43]
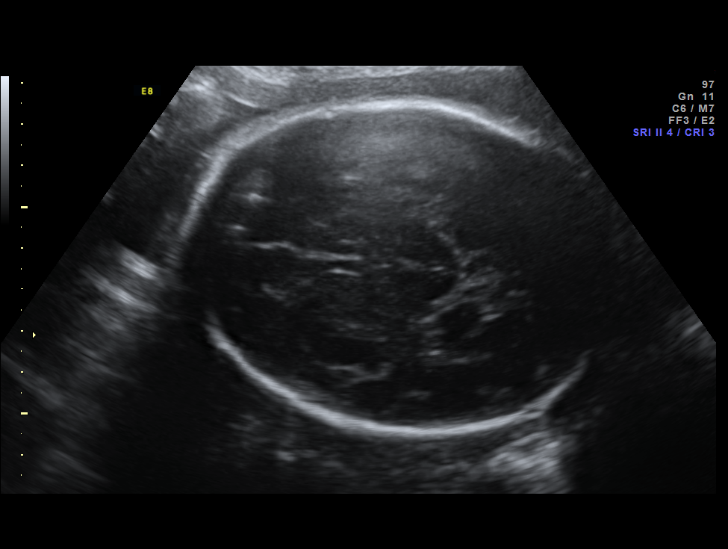
[im 13/43]
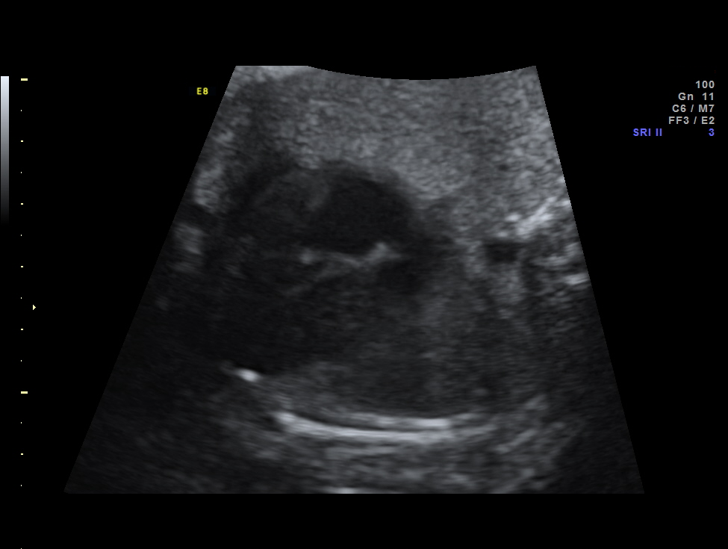
[im 16/43]
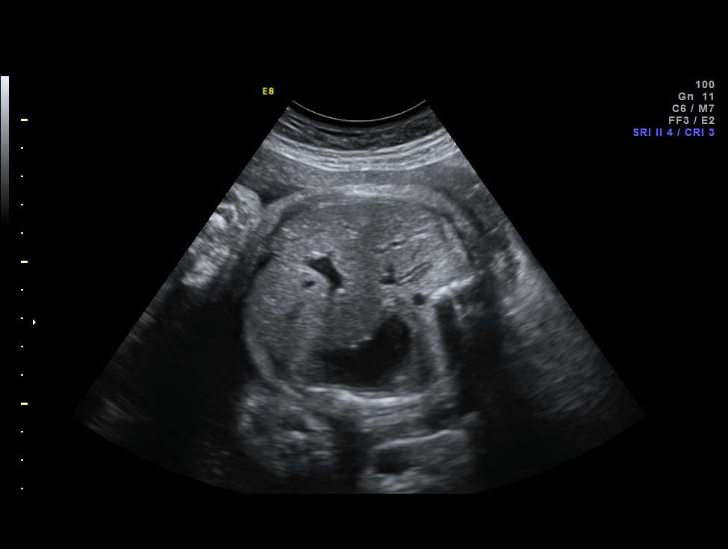
[im 19/43]
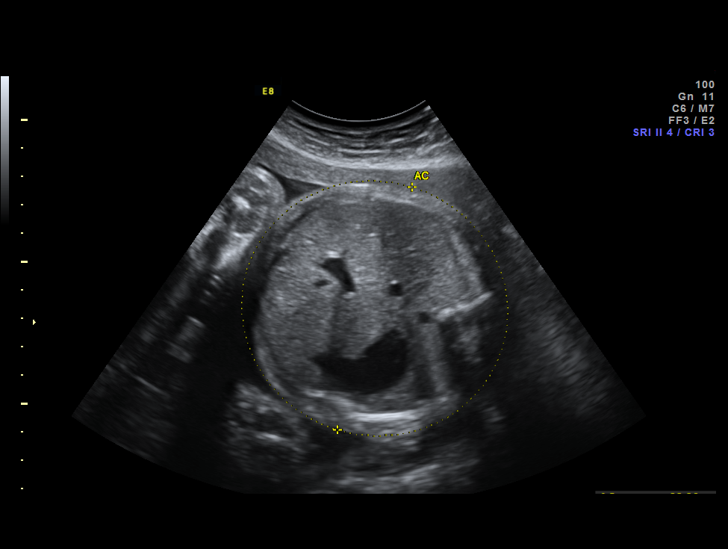
[im 24/43]
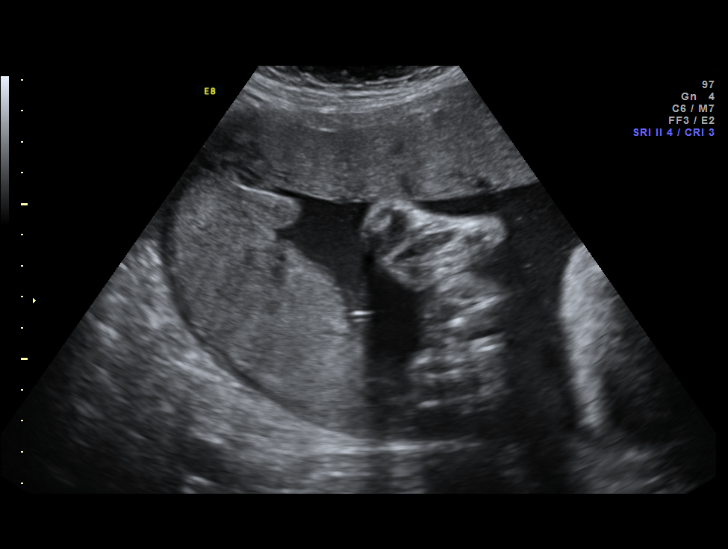
[im 27/43]
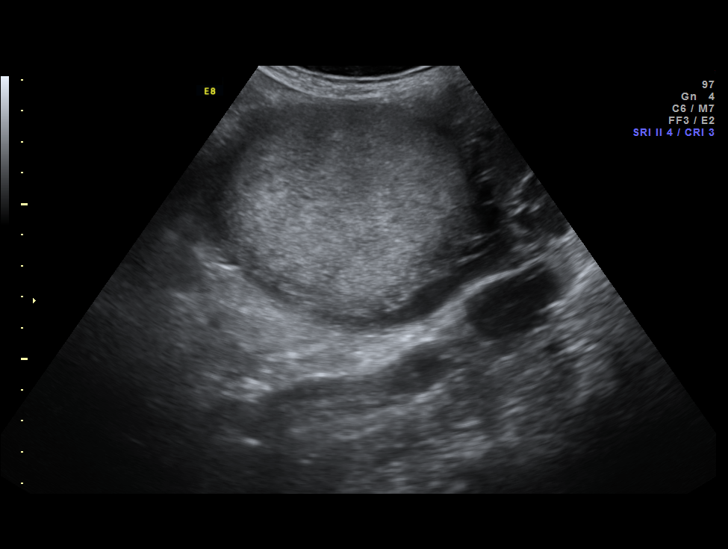
[im 30/43]
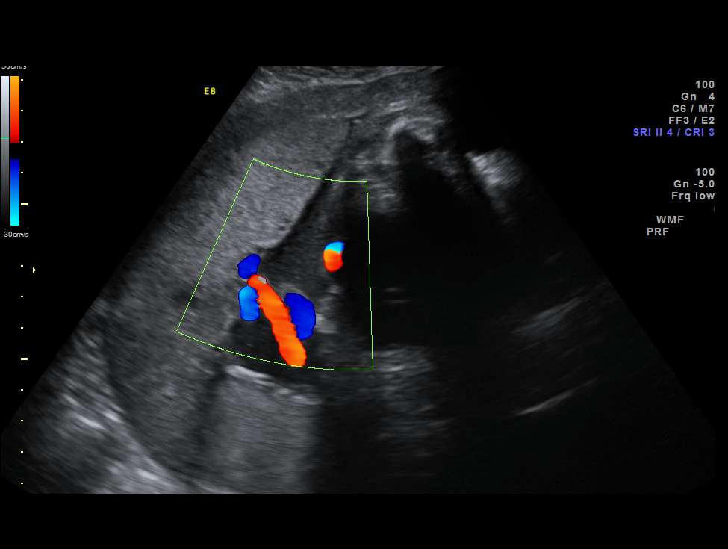
[im 35/43]
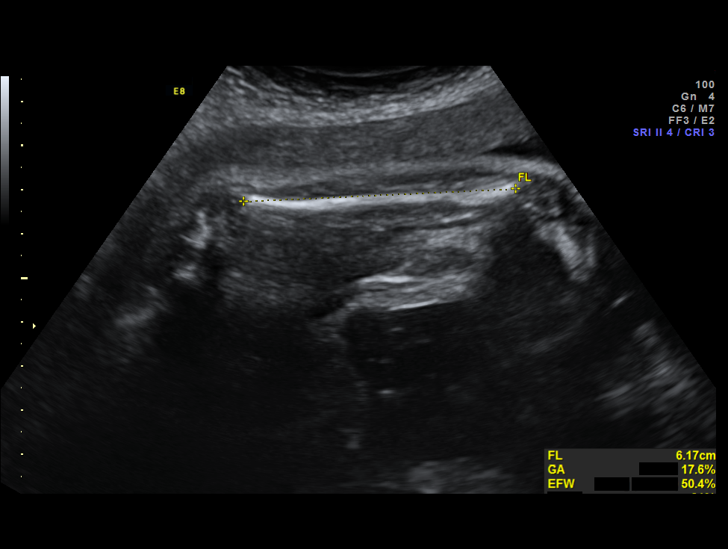
[im 38/43]
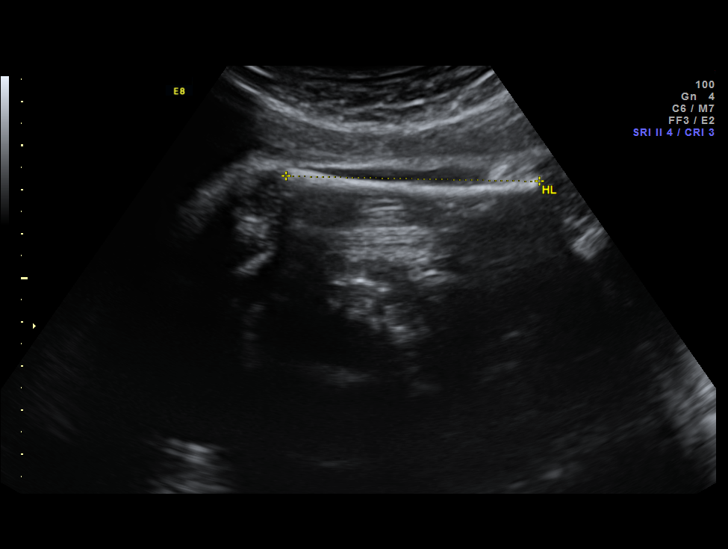
[im 41/43]
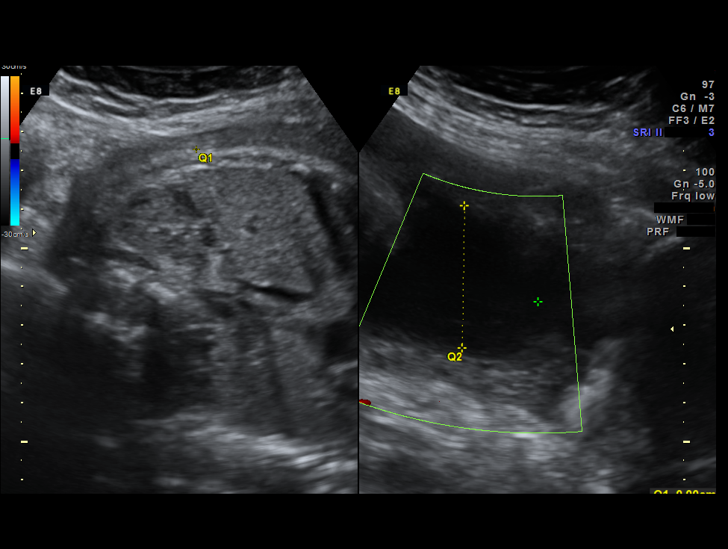

[12 of 28 positions shown; findings below may reference images not displayed]

FINDINGS: 1. Single intrauterine pregnancy.
 2. Estimated fetal weight is in the 52nd%.
 3. Right lateral placenta without evidence of previa.
 4. Normal amniotic fluid index.
 5. The limited anatomy survey is normal.
Recommendations

 1. Appropriate fetal growth.
 2. Hypertension:
 - recommend fetal growth every 4 weeks
 - recommend antenatal testing with twice weekly NSTs and
 weekly AFI
 - monitor closely for signs/symptoms of preeclampsia
 3. Single umbilical artery:
 - previously counseled
 - had normal fetal echocardiogram
 - recommend fetal growth as above

## 2016-11-22 ENCOUNTER — Ambulatory Visit (INDEPENDENT_AMBULATORY_CARE_PROVIDER_SITE_OTHER): Payer: Self-pay | Admitting: Internal Medicine

## 2016-11-22 ENCOUNTER — Encounter: Payer: Self-pay | Admitting: Internal Medicine

## 2016-11-22 VITALS — BP 122/88 | HR 76 | Resp 12 | Ht 60.5 in | Wt 144.0 lb

## 2016-11-22 DIAGNOSIS — B351 Tinea unguium: Secondary | ICD-10-CM

## 2016-11-22 DIAGNOSIS — G8929 Other chronic pain: Secondary | ICD-10-CM

## 2016-11-22 DIAGNOSIS — M545 Low back pain, unspecified: Secondary | ICD-10-CM | POA: Insufficient documentation

## 2016-11-22 DIAGNOSIS — M7061 Trochanteric bursitis, right hip: Secondary | ICD-10-CM

## 2016-11-22 DIAGNOSIS — M7581 Other shoulder lesions, right shoulder: Secondary | ICD-10-CM

## 2016-11-22 MED ORDER — NAPROXEN 500 MG PO TABS: 500.0000 mg | ORAL_TABLET | Freq: Two times a day (BID) | ORAL | 2 refills | Status: DC

## 2016-11-22 NOTE — Progress Notes (Signed)
   Subjective:    Patient ID: Carrie Duncan, female    DOB: [birth year], 31 y.o.   MRN: [id number]  HPI   1.  Toenail onychomycosis:  Stopped nursing her younger almost 162 yo son.  Started Terbinafine 2 weeks ago.  No problems with the medication.  2.  Chronic low back pain and right hip bursitis:  Never heard from PT.  Her right thigh is still bothering her a bit as well.  [facility]. She is able to drive.  3.  Right shoulder pain:  Woke up with pain about 5-6 days ago.  Seems to be gradually improving.  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 She states her shoulder hurts more when using the large armed spray to rinse dishes or when reaching above her head to lift a pile of dishes.  Current Meds  Medication Sig  . cetirizine (ZYRTEC) 10 MG tablet Take 1 tablet (10 mg total) by mouth daily.  Marland Kitchen. etonogestrel (NEXPLANON) 68 MG IMPL implant 1 each by Subdermal route once.  . terbinafine (LAMISIL) 250 MG tablet Take 1 tablet (250 mg total) by mouth daily.    No Known Allergies       Review of Systems     Objective:   Physical Exam  Toenails painted, but appear multiple nails are thickened and crumbling beneath pain.  Right thumbnail with discoloration and periungual swelling, flaking.   Right shoulder:  Discomfort with full abduction/flexion of shoulder.  Tender mildy over subacromial bursa.  NT over AC/CC joints.  Increased pain and decreased strength with downward force on internally rotated abducted right arm/shoulder.   Tender over paraspinous musculature of bilateral lumbosacral area.  NT over spinous processes. Full ROM of back Full ROM of right hip. Tender over right greater trochanter.      Assessment & Plan:  1.  Onychomycosis:  Has started Terbinafine.  To maintain treatment of shoes and shower floor.  Follow up in 4 weeks for hepatic profile and to check nails  2.  Right rotator  cuff tendinitis:  Naproxen 500 mg twice daily with food as needed.  [facility]  3.  Right greater trochanteric bursitis:  As in #2  4.  Bilateral Low back pain:  As in #2  Will need home exercise program and information on posture with work to prevent continued musculoskeletal illness.

## 2016-12-28 ENCOUNTER — Encounter: Payer: Self-pay | Admitting: Internal Medicine

## 2016-12-28 ENCOUNTER — Ambulatory Visit (INDEPENDENT_AMBULATORY_CARE_PROVIDER_SITE_OTHER): Payer: Self-pay | Admitting: Internal Medicine

## 2016-12-28 VITALS — BP 122/88 | HR 70 | Resp 12 | Ht 61.25 in | Wt 142.0 lb

## 2016-12-28 DIAGNOSIS — Z79899 Other long term (current) drug therapy: Secondary | ICD-10-CM

## 2016-12-28 DIAGNOSIS — M7061 Trochanteric bursitis, right hip: Secondary | ICD-10-CM

## 2016-12-28 DIAGNOSIS — M25511 Pain in right shoulder: Secondary | ICD-10-CM

## 2016-12-28 DIAGNOSIS — M545 Low back pain, unspecified: Secondary | ICD-10-CM

## 2016-12-28 DIAGNOSIS — B351 Tinea unguium: Secondary | ICD-10-CM

## 2016-12-28 DIAGNOSIS — G8929 Other chronic pain: Secondary | ICD-10-CM

## 2016-12-28 NOTE — Progress Notes (Signed)
   Subjective:    Patient ID: Carrie Duncan, female    DOB: 1985-10-17, 31 y.o.   MRN: [id number]  HPI   1.  Toenail onychomycosis plus right thumbnail:  States taking Terbinafine daily.  Toenails are better.  She is having pain around her right thumbnail bed and down the dorsal aspect of the thumb.  States the pain around nailbed started 2-3 days ago.  Yesterday, she cut the cuticle and the nail.  Seems to have worsened since doing this.  Hurts to base of thumb.  No fever.     2.  Right shoulder pain, low back pain, and right trochanteric bursitis:  Did go to [facility] clinic.  Was given home exercise program.  She states her pain is much better.  She is only performing exercises once daily.  She could do them daily.   Is able to do her work   Current Meds  Medication Sig  . etonogestrel (NEXPLANON) 68 MG IMPL implant 1 each by Subdermal route once.  . naproxen (NAPROSYN) 500 MG tablet Take 1 tablet (500 mg total) by mouth 2 (two) times daily with a meal.  . terbinafine (LAMISIL) 250 MG tablet Take 1 tablet (250 mg total) by mouth daily.    No Known Allergies    Review of Systems     Objective:   Physical Exam   NAD Full ROM of shoulders bilaterally--smooth movement with no obvious discomfort Minimal paraspinous musculature tenderness of L/S back. Minimal tenderness over right greater trochanter Right thumbnail:  Thickening and discoloration of nail.  Periungual skin cut back significantly with mild swelling at base of nail.  Cuticles completely removed.  No swelling or redness extending to other soft tissues of thumb.  Full ROM Toenails with normal nail from base.  Abnormal nail just at distal margins of nails.       Assessment & Plan:  1.  Right thumbnail and toenail onychomycosis:  Should have 5-6 weeks of treatment left with Lamisil daily.  She gives conflicting reports today as to whether actually taking. Follow up with hepatic profile in 6 weeks and visit  with me. Continue to treat shoes and shower floor. 2.  Right shoulder/low back/right trochanteric bursitis:  All improved.  Encouraged exercises twice daily and to continue indefinitely.  2.

## 2016-12-29 LAB — HEPATIC FUNCTION PANEL
ALBUMIN: 4.4 g/dL (ref 3.5–5.5)
ALK PHOS: 46 IU/L (ref 39–117)
ALT: 10 IU/L (ref 0–32)
AST: 12 IU/L (ref 0–40)
BILIRUBIN TOTAL: 0.3 mg/dL (ref 0.0–1.2)
Bilirubin, Direct: 0.08 mg/dL (ref 0.00–0.40)
TOTAL PROTEIN: 7 g/dL (ref 6.0–8.5)

## 2017-02-08 ENCOUNTER — Encounter: Payer: Self-pay | Admitting: Internal Medicine

## 2017-02-08 ENCOUNTER — Ambulatory Visit (INDEPENDENT_AMBULATORY_CARE_PROVIDER_SITE_OTHER): Payer: Self-pay | Admitting: Internal Medicine

## 2017-02-08 VITALS — BP 122/88 | HR 66 | Resp 12 | Ht 61.0 in | Wt 143.0 lb

## 2017-02-08 DIAGNOSIS — J301 Allergic rhinitis due to pollen: Secondary | ICD-10-CM

## 2017-02-08 DIAGNOSIS — J302 Other seasonal allergic rhinitis: Secondary | ICD-10-CM | POA: Insufficient documentation

## 2017-02-08 DIAGNOSIS — Z79899 Other long term (current) drug therapy: Secondary | ICD-10-CM

## 2017-02-08 DIAGNOSIS — B351 Tinea unguium: Secondary | ICD-10-CM

## 2017-02-08 NOTE — Progress Notes (Signed)
   Subjective:    Patient ID: Carrie Duncan, female    DOB: 29-Jan-1986, 31 y.o.   MRN: [id number]  HPI   1.  Follow up toenail fungus:  Finished Terbinafine last week--full 12 week course.  Feels her toenails are improved.  Cleaning shower floor twice weekly.  Not spraying shoes regularly. Right thumbnail has not really improved since last seen.  Has not tried any topical antifungal cream for this.   Patient states she has had problems with her right thumb nail bed for a year. No longer has the orange card. 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  Allergies:  Apparently, [facility] as [facility].  Current Meds  Medication Sig  . etonogestrel (NEXPLANON) 68 MG IMPL implant 1 each by Subdermal route once.    No Known Allergies  Review of Systems     Objective:   Physical Exam   Sniffling throughout.  Toenails all appear to have 2/3 of nail with normal appearance from base.   Right thumbnail with chronic appearing base of periungual area and thickened, discolored nail at the bilateral nail.  Nail with horizontally undulating surface throughout.        Assessment & Plan:  1.  Toenail Onychomycosis:  Completed 90 days of Terbinafine and toenails fin.  Right thumbnail and nail bed with chronic changes of inflammation and possible fungal nail involvement.  Will try Tinactin topically twice daily to thumbnail bed indefinitely while we wait for dermatology appt. She will need to reapply for orange card. Hepatic profile today.  2.  Allergies:  To pick up zyrtec OTC until gets orange card.  Sounds like this worked for her in the past.

## 2017-02-08 NOTE — Patient Instructions (Signed)
Pick  Up Zyrtec 10 mg daily for allergies--get the [facility] brand (generic is cetirizine) Get Tinactin, a cream you will find in the jock itch area and apply to your thumbnail and surrounding skin twice daily

## 2017-02-09 LAB — HEPATIC FUNCTION PANEL
ALBUMIN: 4.2 g/dL (ref 3.5–5.5)
ALT: 8 IU/L (ref 0–32)
AST: 16 IU/L (ref 0–40)
Alkaline Phosphatase: 47 IU/L (ref 39–117)
BILIRUBIN TOTAL: 0.4 mg/dL (ref 0.0–1.2)
Bilirubin, Direct: 0.1 mg/dL (ref 0.00–0.40)
Total Protein: 7 g/dL (ref 6.0–8.5)

## 2017-06-14 ENCOUNTER — Ambulatory Visit (INDEPENDENT_AMBULATORY_CARE_PROVIDER_SITE_OTHER): Payer: Self-pay | Admitting: Internal Medicine

## 2017-06-14 ENCOUNTER — Encounter: Payer: Self-pay | Admitting: Internal Medicine

## 2017-06-14 VITALS — BP 150/102 | HR 88 | Resp 12 | Ht 61.0 in | Wt 143.0 lb

## 2017-06-14 DIAGNOSIS — G44209 Tension-type headache, unspecified, not intractable: Secondary | ICD-10-CM

## 2017-06-14 DIAGNOSIS — R030 Elevated blood-pressure reading, without diagnosis of hypertension: Secondary | ICD-10-CM

## 2017-06-14 MED ORDER — IBUPROFEN 200 MG PO TABS: ORAL_TABLET | ORAL | 0 refills | Status: DC

## 2017-06-14 MED ORDER — CYCLOBENZAPRINE HCL 10 MG PO TABS: ORAL_TABLET | ORAL | 0 refills | Status: DC

## 2017-06-14 NOTE — Progress Notes (Signed)
   Subjective:    Patient ID: Carrie Duncan, female    DOB: [birth year], 31 y.o.   MRN: [id number]  HPI   Headache from left frontal area around to left nuchal area  and down neck.  Has had for 3 days. Comes and goes. Difficult history.  Feels like her head is swelling.  No throbbing. No aura.   No nausea or vomiting, though has had some epigastric pain.  States had a fever 4 days ago, but did not take temp. May have had a fever 3 days ago as well.   No photophobia.   Has had similar headache before, when gave birth to 15 yo son.  Had PIH at the time as well.   Not clear if increased stress.   Has taken Ibuprofen 400 mg which helped very much.  States helped at least for 4 hours.   No numbness, tingling or weakness in face, arms or legs.   Current Meds  Medication Sig  . etonogestrel (NEXPLANON) 68 MG IMPL implant 1 each by Subdermal route once.    No Known Allergies     Review of Systems     Objective:   Physical Exam   3 children in room, including 2 yo, who is shrieking repeatedly and climbing all over mother. HEENT:  PERRL, EOMI, Discs sharp, TMs pearly gray, nasal mucosa mildly swollen and red.  Throat without injection or exudate. Neck:  Supple, No thyromegaly.  Tender from left nuchal ridge to cervical paraspinous musculature and over trap to left shoulder.  Tight. Chest:  CTA CV:  RRR without murmur or rub, radial and DP pulses normal and equal.  No JVD.   No LE edema. Neuro:  A & O x 3, CN II-XII grossly intact, Motor 5/5 throughout.        Assessment & Plan:  Muscle tension headache:  Ibuprofen 400-800 mg every 6 hours as needed for headache.  Cyclobenzaprine 5-10 mg at bedtime.  Husband to massage area.  Would also like for older children and husband to pitch in watching the younger children, particularly the 2 yo who is a shrieker, which cannot be helping her headache.   2 days off work --Contractor. Return end of week for nursing bp and pulse check and to  give progress report.

## 2017-06-15 ENCOUNTER — Telehealth: Payer: Self-pay | Admitting: Internal Medicine

## 2017-06-15 NOTE — Telephone Encounter (Signed)
Patient called and needs Rx for ibuprofen to [facility] instead of [facility].

## 2017-06-17 ENCOUNTER — Emergency Department (HOSPITAL_COMMUNITY): Payer: Medicaid Other

## 2017-06-17 ENCOUNTER — Encounter (HOSPITAL_COMMUNITY): Payer: Self-pay | Admitting: Emergency Medicine

## 2017-06-17 ENCOUNTER — Emergency Department (HOSPITAL_COMMUNITY)
Admission: EM | Admit: 2017-06-17 | Discharge: 2017-06-17 | Disposition: A | Discharge status: Home / Self Care | Payer: Medicaid Other | Attending: Emergency Medicine | Admitting: Emergency Medicine

## 2017-06-17 ENCOUNTER — Ambulatory Visit (INDEPENDENT_AMBULATORY_CARE_PROVIDER_SITE_OTHER): Payer: Self-pay

## 2017-06-17 VITALS — BP 148/100 | HR 80

## 2017-06-17 DIAGNOSIS — R200 Anesthesia of skin: Secondary | ICD-10-CM | POA: Insufficient documentation

## 2017-06-17 DIAGNOSIS — Z79899 Other long term (current) drug therapy: Secondary | ICD-10-CM | POA: Insufficient documentation

## 2017-06-17 DIAGNOSIS — R29810 Facial weakness: Secondary | ICD-10-CM | POA: Insufficient documentation

## 2017-06-17 DIAGNOSIS — D573 Sickle-cell trait: Secondary | ICD-10-CM | POA: Insufficient documentation

## 2017-06-17 DIAGNOSIS — G43109 Migraine with aura, not intractable, without status migrainosus: Secondary | ICD-10-CM

## 2017-06-17 DIAGNOSIS — R51 Headache: Secondary | ICD-10-CM

## 2017-06-17 DIAGNOSIS — R030 Elevated blood-pressure reading, without diagnosis of hypertension: Secondary | ICD-10-CM

## 2017-06-17 DIAGNOSIS — G43809 Other migraine, not intractable, without status migrainosus: Secondary | ICD-10-CM | POA: Insufficient documentation

## 2017-06-17 LAB — COMPREHENSIVE METABOLIC PANEL
ALBUMIN: 4 g/dL (ref 3.5–5.0)
ALT: 11 U/L — ABNORMAL LOW (ref 14–54)
ANION GAP: 7 (ref 5–15)
AST: 18 U/L (ref 15–41)
Alkaline Phosphatase: 43 U/L (ref 38–126)
BUN: 9 mg/dL (ref 6–20)
CO2: 22 mmol/L (ref 22–32)
Calcium: 8.6 mg/dL — ABNORMAL LOW (ref 8.9–10.3)
Chloride: 109 mmol/L (ref 101–111)
Creatinine, Ser: 0.77 mg/dL (ref 0.44–1.00)
GFR calc Af Amer: >60 mL/min (ref >60)
GFR calc non Af Amer: >60 mL/min (ref >60)
GLUCOSE: 79 mg/dL (ref 65–99)
POTASSIUM: 4 mmol/L (ref 3.5–5.1)
Sodium: 138 mmol/L (ref 135–145)
Total Bilirubin: 0.5 mg/dL (ref 0.3–1.2)
Total Protein: 6.8 g/dL (ref 6.5–8.1)

## 2017-06-17 LAB — I-STAT CHEM 8, ED
BUN: 9 mg/dL (ref 6–20)
CREATININE: 0.7 mg/dL (ref 0.44–1.00)
Calcium, Ion: 1.04 mmol/L — ABNORMAL LOW (ref 1.15–1.40)
Chloride: 106 mmol/L (ref 101–111)
GLUCOSE: 80 mg/dL (ref 65–99)
HCT: 37 % (ref 36.0–46.0)
HEMOGLOBIN: 12.6 g/dL (ref 12.0–15.0)
Potassium: 3.9 mmol/L (ref 3.5–5.1)
Sodium: 140 mmol/L (ref 135–145)
TCO2: 25 mmol/L (ref 22–32)

## 2017-06-17 LAB — CBC WITH DIFFERENTIAL/PLATELET
Basophils Absolute: 0 10*3/uL (ref 0.0–0.1)
Basophils Relative: 1 %
EOS ABS: 0.4 10*3/uL (ref 0.0–0.7)
Eosinophils Relative: 7 %
HEMATOCRIT: 35.2 % — AB (ref 36.0–46.0)
HEMOGLOBIN: 12.1 g/dL (ref 12.0–15.0)
LYMPHS ABS: 2.5 10*3/uL (ref 0.7–4.0)
LYMPHS PCT: 48 %
MCH: 28.6 pg (ref 26.0–34.0)
MCHC: 34.4 g/dL (ref 30.0–36.0)
MCV: 83.2 fL (ref 78.0–100.0)
MONOS PCT: 5 %
Monocytes Absolute: 0.2 10*3/uL (ref 0.1–1.0)
NEUTROS ABS: 1.9 10*3/uL (ref 1.7–7.7)
NEUTROS PCT: 39 %
Platelets: 314 10*3/uL (ref 150–400)
RBC: 4.23 MIL/uL (ref 3.87–5.11)
RDW: 12.8 % (ref 11.5–15.5)
WBC: 5 10*3/uL (ref 4.0–10.5)

## 2017-06-17 LAB — I-STAT BETA HCG BLOOD, ED (MC, WL, AP ONLY): I-stat hCG, quantitative: <5 m[IU]/mL (ref <5)

## 2017-06-17 LAB — RAPID URINE DRUG SCREEN, HOSP PERFORMED
Amphetamines: NOT DETECTED
BARBITURATES: NOT DETECTED
BENZODIAZEPINES: NOT DETECTED
COCAINE: NOT DETECTED
OPIATES: NOT DETECTED
Tetrahydrocannabinol: NOT DETECTED

## 2017-06-17 LAB — URINALYSIS, ROUTINE W REFLEX MICROSCOPIC
Bilirubin Urine: NEGATIVE
GLUCOSE, UA: NEGATIVE mg/dL
HGB URINE DIPSTICK: NEGATIVE
Ketones, ur: NEGATIVE mg/dL
LEUKOCYTES UA: NEGATIVE
Nitrite: NEGATIVE
PROTEIN: NEGATIVE mg/dL
Specific Gravity, Urine: 1.004 — ABNORMAL LOW (ref 1.005–1.030)
pH: 7 (ref 5.0–8.0)

## 2017-06-17 LAB — I-STAT TROPONIN, ED: TROPONIN I, POC: 0 ng/mL (ref 0.00–0.08)

## 2017-06-17 LAB — PROTIME-INR
INR: 1.1
PROTHROMBIN TIME: 14.1 s (ref 11.4–15.2)

## 2017-06-17 LAB — APTT: APTT: 34 s (ref 24–36)

## 2017-06-17 LAB — CBG MONITORING, ED: Glucose-Capillary: 80 mg/dL (ref 65–99)

## 2017-06-17 MED ORDER — CYCLOBENZAPRINE HCL 10 MG PO TABS: ORAL_TABLET | ORAL | 0 refills | Status: DC

## 2017-06-17 MED ORDER — MAGNESIUM SULFATE 2 GM/50ML IV SOLN
2.0000 g | Freq: Once | INTRAVENOUS | Status: AC
  Administered 2017-06-17: 2 g via INTRAVENOUS
  Filled 2017-06-17: qty 50

## 2017-06-17 MED ORDER — KETOROLAC TROMETHAMINE 30 MG/ML IJ SOLN
30.0000 mg | Freq: Once | INTRAMUSCULAR | Status: AC
  Administered 2017-06-17: 30 mg via INTRAVENOUS
  Filled 2017-06-17: qty 1

## 2017-06-17 MED ORDER — PROCHLORPERAZINE EDISYLATE 5 MG/ML IJ SOLN: 10.0000 mg | Freq: Four times a day (QID) | INTRAMUSCULAR | Status: DC | PRN
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
## 2017-06-17 NOTE — Code Documentation (Addendum)
31yo female arriving to [facility] via [facility] at 74.  Patient from work where she was noted to have right facial droop and altered mental status at 1200.  EMS called and assessed slight right facial droop which resolved en route.  Stroke team at the bedside on patient arrival.  Labs drawn and patient cleared for CT by Dr. Silverio Lay.  Patient to CT with team.  CT completed.  NIHSS 3, see documentation for details and code stroke times.  Patient with bilateral arm drift on exam which seems to be effort dependent.  Patient with inconsistent sensory exam, however, reporting decreased sensation on the left side.  Patient with fatigue and headache x 1 week. Patient reporting headache and dizziness.  Patient to MRI at 1324.  No acute stroke treatment at this time.  Code stroke canceled.  Handoff with ED RN Tobi Bastos.
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
## 2017-06-17 NOTE — Telephone Encounter (Signed)
Patient came in for BP check. Informed she needed to get Ibuprofen OTC. Patient had a RX for flexiril that she was suppose to pick up from the pharmacy.

## 2017-06-17 NOTE — Progress Notes (Signed)
Per Dr. Delrae Alfred have patient pick up. Flexeril and come back in 1 week. Called patient and she is currently at ER transported there by EMS from after feeling dizzy and BP running higher than before. Per Dr. Delrae Alfred have patient seen there call on Monday with follow up.Patient verbalized understanding.

## 2017-06-17 NOTE — Telephone Encounter (Signed)
I did not send a prescription--she was to purchase over the counter strength Ibuprofen.  Please notify

## 2017-06-17 NOTE — ED Triage Notes (Addendum)
Pt in via [facility] with stroke-like symptoms, LSN at 1200 today. Per EMS, pt was at work and co-workers noticed R side facial droop and unsteady gait around noon. EMS reports speech clear, pt a&ox4. R side facial droop noticed on arrival, able to MAE's equally but weak on both sides. VSS, denies pain, n/v or dizziness. Airway intact, stroke team present on ED arrival

## 2017-06-23 ENCOUNTER — Ambulatory Visit: Payer: Self-pay | Admitting: Neurology

## 2017-06-23 ENCOUNTER — Telehealth: Payer: Self-pay

## 2017-06-23 ENCOUNTER — Other Ambulatory Visit (INDEPENDENT_AMBULATORY_CARE_PROVIDER_SITE_OTHER): Payer: Self-pay

## 2017-06-23 VITALS — BP 130/100 | HR 78

## 2017-06-23 DIAGNOSIS — R030 Elevated blood-pressure reading, without diagnosis of hypertension: Secondary | ICD-10-CM

## 2017-06-23 NOTE — Telephone Encounter (Signed)
PATIENT WAS A NO SHOW FOR NEW PT APPT.Marland Kitchen

## 2017-06-24 ENCOUNTER — Encounter: Payer: Self-pay | Admitting: Neurology

## 2017-06-27 ENCOUNTER — Encounter: Payer: Self-pay | Admitting: Internal Medicine

## 2017-06-27 ENCOUNTER — Ambulatory Visit (INDEPENDENT_AMBULATORY_CARE_PROVIDER_SITE_OTHER): Payer: Self-pay | Admitting: Internal Medicine

## 2017-06-27 VITALS — BP 150/110 | HR 76 | Resp 12 | Ht 61.0 in | Wt 143.0 lb

## 2017-06-27 DIAGNOSIS — G44209 Tension-type headache, unspecified, not intractable: Secondary | ICD-10-CM

## 2017-06-27 DIAGNOSIS — I10 Essential (primary) hypertension: Secondary | ICD-10-CM

## 2017-06-27 DIAGNOSIS — G43109 Migraine with aura, not intractable, without status migrainosus: Secondary | ICD-10-CM

## 2017-06-27 HISTORY — DX: Migraine with aura, not intractable, without status migrainosus: G43.109

## 2017-06-27 HISTORY — DX: Tension-type headache, unspecified, not intractable: G44.209

## 2017-06-27 MED ORDER — METOPROLOL TARTRATE 25 MG PO TABS: 25.0000 mg | ORAL_TABLET | Freq: Two times a day (BID) | ORAL | 11 refills | Status: DC

## 2017-06-27 MED ORDER — CETIRIZINE HCL 10 MG PO TABS: 10.0000 mg | ORAL_TABLET | Freq: Every day | ORAL | 11 refills | Status: DC

## 2017-06-27 MED ORDER — SUMATRIPTAN SUCCINATE 50 MG PO TABS: 50.0000 mg | ORAL_TABLET | ORAL | 6 refills | Status: DC | PRN
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
## 2017-06-27 NOTE — Patient Instructions (Signed)
Take Cyclobenzaprine 1/2 hour before bedtime

## 2017-06-28 NOTE — Progress Notes (Signed)
Referral along with OV notes and demographics faxed to [facility]. Facility will contact patient and schedule appointment. 

## 2017-06-29 ENCOUNTER — Ambulatory Visit (INDEPENDENT_AMBULATORY_CARE_PROVIDER_SITE_OTHER): Payer: Self-pay | Admitting: Licensed Clinical Social Worker

## 2017-06-29 DIAGNOSIS — F439 Reaction to severe stress, unspecified: Secondary | ICD-10-CM
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
## 2017-07-06 ENCOUNTER — Encounter: Payer: Self-pay | Admitting: Internal Medicine

## 2017-07-06 ENCOUNTER — Ambulatory Visit (INDEPENDENT_AMBULATORY_CARE_PROVIDER_SITE_OTHER): Payer: Self-pay | Admitting: Internal Medicine

## 2017-07-06 VITALS — BP 130/88 | HR 64 | Resp 12 | Ht 61.0 in | Wt 141.0 lb

## 2017-07-06 DIAGNOSIS — I10 Essential (primary) hypertension: Secondary | ICD-10-CM

## 2017-07-06 DIAGNOSIS — J302 Other seasonal allergic rhinitis: Secondary | ICD-10-CM

## 2017-07-06 DIAGNOSIS — G43109 Migraine with aura, not intractable, without status migrainosus: Secondary | ICD-10-CM

## 2017-07-06 DIAGNOSIS — G44209 Tension-type headache, unspecified, not intractable: Secondary | ICD-10-CM

## 2017-07-06 MED ORDER — CYCLOBENZAPRINE HCL 10 MG PO TABS: ORAL_TABLET | ORAL | 0 refills | Status: DC

## 2017-07-06 NOTE — Patient Instructions (Signed)
Get set up with orange card and go pick up sumatriptan for your migraine headaches Call in 1 week if you have not heard from PT in [facility].

## 2017-07-06 NOTE — Progress Notes (Signed)
   Subjective:    Patient ID: Carrie Duncan, female    DOB: January 17, 1986, 31 y.o.   MRN: [id number]  HPI   1.  Migraine:  Did not get Imitrex as orange card expired.  She has an appointment for orange card sign up at [facility] shortly.    2.  Tension headache:  States has not heard from [facility].  Has used all of Cyclobenzaprine.  This helped.  Had a bit of a headache yesterday. Unable to get a good history today again with what she is doing with meds.  She would like a new prescription for Cyclobenzaprine. Has met with Macie Burows, LCSW for evaluation of past trauma and possible PTSD which could be driving some of her risk for all types of headaches  3.  Essential Hypertension;  Taking Metoprolol 25 mg twice daily.  No problems with medication.  Has been taking now since 06/27/2017.    4.  Allergies:  Zyrtec 10 mg, but not daily.  Discussed for now would take daily  Current Meds  Medication Sig  . cetirizine (ZYRTEC) 10 MG tablet Take 1 tablet (10 mg total) by mouth daily.  Marland Kitchen etonogestrel (NEXPLANON) 68 MG IMPL implant 68 mg by Subdermal route once. Implanted April 2016  . ibuprofen (ADVIL,MOTRIN) 200 MG tablet 2-4 tabs by mouth every 6 hours with food as needed for headache (Patient taking differently: 400 mg every 6 (six) hours as needed for headache. Take with food)  . metoprolol tartrate (LOPRESSOR) 25 MG tablet Take 1 tablet (25 mg total) by mouth 2 (two) times daily.   No Known Allergies  Review of Systems     Objective:   Physical Exam NAD Exam unchanged PERRL, EOMI Lungs:  CTA CV:  RRR without murmur or rub, radial pulses normal and equal Neuro:  Unchanged        Assessment & Plan:  1.  Complicated Migraine: none of these since last visit.  2.  Tension Headache:  Refill cyclobenzaprine.  To call if does not hear from [facility] clinic in next week.  3.  Essential Hypertension:  Improved with Metoprolol and hopefully will help prevent  migraines.  4.  Stress:  Following with Macie Burows for evaluation and treatment of probable PTSD  5.  Allergies:  To take zyrtec regularly until good freeze.

## 2017-07-08 ENCOUNTER — Other Ambulatory Visit: Payer: Self-pay | Admitting: Licensed Clinical Social Worker

## 2017-07-14 ENCOUNTER — Ambulatory Visit (INDEPENDENT_AMBULATORY_CARE_PROVIDER_SITE_OTHER): Payer: Self-pay | Admitting: Licensed Clinical Social Worker

## 2017-07-14 DIAGNOSIS — F4310 Post-traumatic stress disorder, unspecified: Secondary | ICD-10-CM

## 2017-07-21 ENCOUNTER — Ambulatory Visit (INDEPENDENT_AMBULATORY_CARE_PROVIDER_SITE_OTHER): Payer: Self-pay | Admitting: Licensed Clinical Social Worker

## 2017-07-21 DIAGNOSIS — F4310 Post-traumatic stress disorder, unspecified: Secondary | ICD-10-CM
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
## 2017-07-21 NOTE — Progress Notes (Signed)
   THERAPY PROGRESS NOTE  Session Time: *15 minutes**  Participation Level: Active  Behavioral Response: Fairly Groomed and Well GroomedAlertEuthymic  Type of Therapy: Individual Therapy  Treatment Goals addressed: Coping  Interventions: Motivational Interviewing  Summary: Carrie LintsChantal [place] is a 31 y.o. female . Social TEFL teacherWorker Intern implemented a Insurance underwritervisual representation of an ECO-Map during the session. SWI explained to [place] the purpose of the Eco-Map and how it allows individuals to understand how their external factors and outside influences connects to them. [place] seemed to be receptive to the activity and was excited to complete it. During the activity, Carrie [place] did have a tough time trying to come up with things to put in each circle, and what it means to her. In each circle, Carrie [place] discussed how each positive circle she drew connected to her life, she discussed her family, and talking to her sister who lives in [place]. [place] also drew circles in which she reported that something she finds value in is going back to school, become better at speaking English and wanting to find peace and happiness within herself. Carrie [place] also explained in her drawing that she feels that she worries too much and wants to work on reducing it.   Suicidal/Homicidal: Negativewithout intent/plan  Therapist Response: SWI worked on building an initial rapport with Carrie school teacherChantal and offered empathic listening, and used motivational interviewing. SWI was able to discuss interventions they can use to cope with reducing her worrying and stress. We discussed mindfulness techniques that she will be able to implement outside of the sessions. The purpose of the Eco map was a way to assess life domains and build rapport.  The next session is scheduled for next week.   Plan: Return again in 1 weeks.    [place], Student-Social Work 07/21/2017

## 2017-07-28 ENCOUNTER — Ambulatory Visit (INDEPENDENT_AMBULATORY_CARE_PROVIDER_SITE_OTHER): Payer: Self-pay | Admitting: Licensed Clinical Social Worker

## 2017-07-28 DIAGNOSIS — F4310 Post-traumatic stress disorder, unspecified: Secondary | ICD-10-CM

## 2017-08-04 NOTE — Progress Notes (Signed)
   THERAPY PROGRESS NOTE  Session Time: 30 minutes  Participation Level: Active  Behavioral Response: Well GroomedAlert Euthymic  Type of Therapy: Individual Therapy  Treatment Goals addressed: Coping  Interventions: Supportive  Summary: Carrie LintsChantal [place] is a 31 y.o. female who presents a Euthymic affect  mood with the approriate mood. Social TEFL teacherWorker Intern implemented a Insurance underwritervisual representation that demonstrated how worrying and stress can be a heavy burden to carry on a daily bases, the purpose of the exercise was to write down things that worry her about and then to release them. [place] seemed very receptive to activity. [place] did seem to have a tough time trying to come up with things that she worries about on a daily basis. However, she was able to write down things that were stressors which included bills and her health concerns. During the session the discussion was around insurance, the application process for the orange card and home remedies were all possible solutions to help with her health concerns. .   Suicidal/Homicidal: Nowithout intent/plan  Therapist Response: SWI is still continuing to build initial rapport with [place], as well as continuing to offer empathic listening throughout the session. SWI believed joining in with the activity with [place] would help to normalize the activity.  SWI engaged Caylei in a guided imagery, which [place] reported was helpful. SWI also explored with [place] how does she usually deal with stress which she reported that she usually tries to forget about it. She also self-reported that she does not usually talk to anyone when she is worried or stressed. SWI suggested additional interventions and resources she could use such as journaling, going on walks, and implementing more mindfulness. The next session is scheduled for next week.    Plan: Return again in 1 weeks.     Ankush Gintz [place], Student-Social Work 08/04/2017

## 2017-08-11 ENCOUNTER — Other Ambulatory Visit: Payer: Self-pay | Admitting: Licensed Clinical Social Worker

## 2017-09-27 HISTORY — PX: OTHER SURGICAL HISTORY: SHX169

## 2017-10-07 ENCOUNTER — Ambulatory Visit: Payer: Self-pay | Admitting: Internal Medicine

## 2017-10-21 ENCOUNTER — Encounter: Payer: Self-pay | Admitting: Family

## 2017-10-21 ENCOUNTER — Ambulatory Visit (INDEPENDENT_AMBULATORY_CARE_PROVIDER_SITE_OTHER): Payer: Self-pay | Admitting: Family

## 2017-10-21 VITALS — BP 152/89 | HR 85 | Temp 98.1°F

## 2017-10-21 DIAGNOSIS — L03011 Cellulitis of right finger: Secondary | ICD-10-CM

## 2017-10-21 MED ORDER — FLUCONAZOLE 100 MG PO TABS: 100.0000 mg | ORAL_TABLET | Freq: Every day | ORAL | 0 refills | Status: DC

## 2017-10-21 MED ORDER — CEFDINIR 300 MG PO CAPS: 300.0000 mg | ORAL_CAPSULE | Freq: Two times a day (BID) | ORAL | 0 refills | Status: DC

## 2017-10-21 MED ORDER — AMOXICILLIN-POT CLAVULANATE 875-125 MG PO TABS
1.0000 | ORAL_TABLET | Freq: Two times a day (BID) | ORAL | 0 refills | Status: DC
Start: 2017-10-21 — End: 2017-11-08
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
## 2017-10-21 NOTE — Patient Instructions (Addendum)
Thank you for choosing [facility].  Nice to meet you!  We have sent in 2 antibiotics and 1 fungal medication to help treat your thumb.   Please complete all medications as prescribed.   Continue with wound care per Dr. Merlyn LotKuzma.   Follow up if your symptoms worsen or do not improve.

## 2017-10-21 NOTE — Assessment & Plan Note (Signed)
Carrie Duncan has a polymicrobial paronychia with Enterococcus faecalis, beta-hemolytic strep group B, Enterobacter cloacae, and Morganella morganii. Also noted to have Candida albicans. Recent x-rays with no evidence of osteomyelitis. Appears to be localized infection. Start Omnicef, Augmentin, and fluconazole 7 days. Continue wound care per orthopedics. Follow-up if symptoms worsen or do not improve.

## 2017-11-08 ENCOUNTER — Other Ambulatory Visit: Payer: Self-pay

## 2017-11-08 ENCOUNTER — Ambulatory Visit (HOSPITAL_COMMUNITY)
Admission: EM | Admit: 2017-11-08 | Discharge: 2017-11-08 | Disposition: A | Discharge status: Home / Self Care | Payer: Self-pay | Attending: Family Medicine | Admitting: Family Medicine

## 2017-11-08 ENCOUNTER — Encounter (HOSPITAL_COMMUNITY): Payer: Self-pay | Admitting: Emergency Medicine

## 2017-11-08 DIAGNOSIS — B9789 Other viral agents as the cause of diseases classified elsewhere: Secondary | ICD-10-CM

## 2017-11-08 DIAGNOSIS — Z3202 Encounter for pregnancy test, result negative: Secondary | ICD-10-CM

## 2017-11-08 DIAGNOSIS — N898 Other specified noninflammatory disorders of vagina: Secondary | ICD-10-CM

## 2017-11-08 DIAGNOSIS — R05 Cough: Secondary | ICD-10-CM

## 2017-11-08 DIAGNOSIS — J069 Acute upper respiratory infection, unspecified: Secondary | ICD-10-CM

## 2017-11-08 LAB — POCT URINALYSIS DIP (DEVICE)
BILIRUBIN URINE: NEGATIVE
Glucose, UA: NEGATIVE mg/dL
HGB URINE DIPSTICK: NEGATIVE
KETONES UR: NEGATIVE mg/dL
Leukocytes, UA: NEGATIVE
Nitrite: NEGATIVE
PH: 7 (ref 5.0–8.0)
Protein, ur: NEGATIVE mg/dL
SPECIFIC GRAVITY, URINE: 1.01 (ref 1.005–1.030)
Urobilinogen, UA: 0.2 mg/dL (ref 0.0–1.0)

## 2017-11-08 LAB — POCT PREGNANCY, URINE: PREG TEST UR: NEGATIVE

## 2017-11-08 MED ORDER — FLUTICASONE PROPIONATE 50 MCG/ACT NA SUSP: 1.0000 | Freq: Every day | NASAL | 0 refills | Status: DC

## 2017-11-08 MED ORDER — GUAIFENESIN-DM 100-10 MG/5ML PO SYRP: 10.0000 mL | ORAL_SOLUTION | Freq: Four times a day (QID) | ORAL | 0 refills | Status: AC | PRN

## 2017-11-08 MED ORDER — CETIRIZINE HCL 10 MG PO TABS: 10.0000 mg | ORAL_TABLET | Freq: Every day | ORAL | 0 refills | Status: DC
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
## 2017-11-08 NOTE — Discharge Instructions (Signed)
You likely having a viral upper respiratory infection. We recommended symptom control. I expect your symptoms to start improving in the next 1-2 weeks.   For congestion I sent in a daily Zyrtec pill, and Flonase nasal spray to use in both nostrils daily.  You may increase to 2 sprays daily if congestion still persisting.  For your sore throat you may try cepacol lozenges, salt water gargles, throat spray. Treatment of congestion may also help your sore throat.  For cough you may send an Robitussin-DM, you may also try over-the-counter Delsym or Robitussin.  Take Tylenol or Ibuprofen-alternate every 4 hours to help with fever, headache, pain  Stay hydrated, drink plenty of fluids to keep throat coated and less irritated  Honey Tea For cough/sore throat try using a honey-based tea. Use 3 teaspoons of honey with juice squeezed from half lemon. Place shaved pieces of ginger into 1/2-1 cup of water and warm over stove top. Then mix the ingredients and repeat every 4 hours as needed.  Please return if symptoms worsening, develop difficulty breathing, shortness of breath, chest pain, changing symptoms.

## 2017-11-08 NOTE — ED Triage Notes (Signed)
Pt reports chills, cough, nasal congestion and headache x4 days.  She reports an unmeasured fever.
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
## 2018-09-11 ENCOUNTER — Encounter (HOSPITAL_COMMUNITY): Payer: Self-pay | Admitting: *Deleted

## 2018-09-11 ENCOUNTER — Inpatient Hospital Stay (HOSPITAL_COMMUNITY): Payer: Self-pay

## 2018-09-11 ENCOUNTER — Other Ambulatory Visit: Payer: Self-pay

## 2018-09-11 ENCOUNTER — Inpatient Hospital Stay (HOSPITAL_COMMUNITY)
Admission: AD | Admit: 2018-09-11 | Discharge: 2018-09-11 | Disposition: A | Discharge status: Home / Self Care | Payer: Self-pay | Attending: Obstetrics and Gynecology | Admitting: Obstetrics and Gynecology

## 2018-09-11 DIAGNOSIS — R1030 Lower abdominal pain, unspecified: Secondary | ICD-10-CM | POA: Insufficient documentation

## 2018-09-11 DIAGNOSIS — R109 Unspecified abdominal pain: Secondary | ICD-10-CM

## 2018-09-11 DIAGNOSIS — Z3491 Encounter for supervision of normal pregnancy, unspecified, first trimester: Secondary | ICD-10-CM

## 2018-09-11 DIAGNOSIS — Z3A01 Less than 8 weeks gestation of pregnancy: Secondary | ICD-10-CM | POA: Insufficient documentation

## 2018-09-11 DIAGNOSIS — O26891 Other specified pregnancy related conditions, first trimester: Secondary | ICD-10-CM | POA: Insufficient documentation

## 2018-09-11 HISTORY — DX: Essential (primary) hypertension: I10

## 2018-09-11 LAB — WET PREP, GENITAL
Clue Cells Wet Prep HPF POC: NONE SEEN
SPERM: NONE SEEN
Trich, Wet Prep: NONE SEEN
Yeast Wet Prep HPF POC: NONE SEEN

## 2018-09-11 LAB — CBC
HEMATOCRIT: 33.9 % — AB (ref 36.0–46.0)
HEMOGLOBIN: 11.2 g/dL — AB (ref 12.0–15.0)
MCH: 27.1 pg (ref 26.0–34.0)
MCHC: 33 g/dL (ref 30.0–36.0)
MCV: 81.9 fL (ref 80.0–100.0)
Platelets: 296 10*3/uL (ref 150–400)
RBC: 4.14 MIL/uL (ref 3.87–5.11)
RDW: 14.7 % (ref 11.5–15.5)
WBC: 7.4 10*3/uL (ref 4.0–10.5)
nRBC: 0 % (ref 0.0–0.2)

## 2018-09-11 LAB — URINALYSIS, ROUTINE W REFLEX MICROSCOPIC
Bilirubin Urine: NEGATIVE
GLUCOSE, UA: NEGATIVE mg/dL
HGB URINE DIPSTICK: NEGATIVE
Ketones, ur: NEGATIVE mg/dL
Leukocytes, UA: NEGATIVE
Nitrite: NEGATIVE
PH: 6 (ref 5.0–8.0)
Protein, ur: NEGATIVE mg/dL
SPECIFIC GRAVITY, URINE: 1.015 (ref 1.005–1.030)

## 2018-09-11 LAB — HCG, QUANTITATIVE, PREGNANCY: hCG, Beta Chain, Quant, S: 154001 m[IU]/mL — ABNORMAL HIGH (ref <5)

## 2018-09-11 LAB — POCT PREGNANCY, URINE: Preg Test, Ur: POSITIVE — AB
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
## 2018-09-11 NOTE — MAU Note (Signed)
Missed period. Did 2 home tests, didn't say if preg or not.  Is having cramping in lower, started 2 wks ago.
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
## 2018-09-11 NOTE — Discharge Instructions (Signed)

## 2018-09-12 LAB — HIV ANTIBODY (ROUTINE TESTING W REFLEX): HIV SCREEN 4TH GENERATION: NONREACTIVE

## 2018-09-12 LAB — GC/CHLAMYDIA PROBE AMP (~~LOC~~) NOT AT ARMC
Chlamydia: NEGATIVE
Neisseria Gonorrhea: NEGATIVE

## 2018-09-27 NOTE — L&D Delivery Note (Signed)
Called Dr Tacy Dura to ask if patient could have her Oxycodone for a pain of seven.She had post op Duramorph at 1137 AM and he gave ok to give a dose now.

## 2018-11-07 ENCOUNTER — Ambulatory Visit (INDEPENDENT_AMBULATORY_CARE_PROVIDER_SITE_OTHER): Payer: Medicaid Other | Admitting: *Deleted

## 2018-11-07 ENCOUNTER — Encounter: Payer: Self-pay | Admitting: *Deleted

## 2018-11-07 ENCOUNTER — Other Ambulatory Visit: Payer: Self-pay

## 2018-11-07 ENCOUNTER — Ambulatory Visit: Payer: Medicaid Other | Admitting: Clinical

## 2018-11-07 VITALS — BP 136/96 | HR 71 | Temp 98.9°F | Wt 140.0 lb

## 2018-11-07 DIAGNOSIS — O0990 Supervision of high risk pregnancy, unspecified, unspecified trimester: Secondary | ICD-10-CM | POA: Diagnosis not present

## 2018-11-07 DIAGNOSIS — Z23 Encounter for immunization: Secondary | ICD-10-CM

## 2018-11-07 DIAGNOSIS — O10919 Unspecified pre-existing hypertension complicating pregnancy, unspecified trimester: Secondary | ICD-10-CM

## 2018-11-07 LAB — POCT URINALYSIS DIP (DEVICE)
Bilirubin Urine: NEGATIVE
Glucose, UA: NEGATIVE mg/dL
HGB URINE DIPSTICK: NEGATIVE
Ketones, ur: NEGATIVE mg/dL
Leukocytes,Ua: NEGATIVE
Nitrite: NEGATIVE
PH: 8.5 — AB (ref 5.0–8.0)
Protein, ur: NEGATIVE mg/dL
Specific Gravity, Urine: 1.015 (ref 1.005–1.030)
Urobilinogen, UA: 0.2 mg/dL (ref 0.0–1.0)

## 2018-11-07 MED ORDER — LABETALOL HCL 100 MG PO TABS: 100.0000 mg | ORAL_TABLET | Freq: Two times a day (BID) | ORAL | 2 refills | Status: DC

## 2018-11-07 MED ORDER — ASPIRIN EC 81 MG PO TBEC: 81.0000 mg | DELAYED_RELEASE_TABLET | Freq: Every day | ORAL | 12 refills | Status: DC

## 2018-11-07 MED ORDER — PRENATAL VITAMINS 28-0.8 MG PO TABS: 1.0000 | ORAL_TABLET | Freq: Every day | ORAL | 12 refills | Status: DC

## 2018-11-07 NOTE — Progress Notes (Signed)
Swahili interpreter remained present in room for encounter however not needed as pt speaks and understands English well. Pt declined use of live interpreter for future visits. She was advised of video and audio interpreter available if needed at any time. New Ob intake completed and pregnancy information packet given. Labs drawn, Medicaid Home form completed and [place] for anatomy scheduled. Pt has Hx of CHTN and has not taken meds x1 year due to no insurance - now has pregnancy Medicaid. She confirms having occasional mild H/A however denies H/A or visual disturbances today. Consult w/Dr. Alysia Penna and Rx for ASA 81 mg as well as Labetalol prescribed. Initial prenatal visit scheduled on 11/21/18.

## 2018-11-07 NOTE — BH Specialist Note (Signed)
Integrated Behavioral Health Initial Visit  MRN: [id number] Name: Carrie Duncan  Number of Integrated Behavioral Health Clinician visits:: 1/6 Session Start time: 9:51 Session End time: 10:02 Total time: 15 minutes  Type of Service: Integrated Behavioral Health- Individual/Family Interpretor:No. Interpretor Name and Language: Kiswahili (pt did not use interpreter)   Warm Hand Off Completed.       SUBJECTIVE: Carrie Duncan is a 33 y.o. female accompanied by Kiswahili interpreter Patient was referred by Carrie Muta Day, RN for Initial OB introduction to integrated behavioral health services . Patient reports the following symptoms/concerns: Pt states no particular concern today.  Duration of problem: n/a; Severity of problem: n/a  OBJECTIVE: Mood: Normal and Affect: Appropriate Risk of harm to self or others: No plan to harm self or others  LIFE CONTEXT: Family and Social:  School/Work: Pt lives with her husband and children (8yo; 5yo; 3yo) Self-Care: - Life Changes: Current pregnancy  GOALS ADDRESSED: Patient will: 1. Increase knowledge and/or ability of: healthy habits   INTERVENTIONS: Interventions utilized: Psychoeducation and/or Health Education  Standardized Assessments completed: GAD-7 and PHQ 9  ASSESSMENT: Patient currently experiencing Supervision of high risk pregnancy, antepartum   Patient may benefit from Initial OB introduction to integrated behavioral health services .  PLAN: 1. Follow up with behavioral health clinician on : As needed 2. Behavioral recommendations:  -Take prenatal vitamin, as recommended by medical provider 3. Referral(s): Integrated Behavioral Health Services (In Clinic) 4. "From scale of 1-10, how likely are you to follow plan?": 10  Rae Lips, LCSW  Depression screen [facility] 2/9 11/07/2018 10/21/2017 08/23/2016 12/04/2014 11/06/2014  Decreased Interest 0 0 1 0 0  Down, Depressed, Hopeless 0 0 1 0 0  PHQ - 2 Score 0 0 2 0 0   Altered sleeping 0 - 1 - -  Tired, decreased energy 0 - 1 - -  Change in appetite 0 - 0 - -  Feeling bad or failure about yourself  0 - 2 - -  Trouble concentrating 0 - 0 - -  Moving slowly or fidgety/restless 0 - 0 - -  Suicidal thoughts 0 - 0 - -  PHQ-9 Score 0 - 6 - -   GAD 7 : Generalized Anxiety Score 11/07/2018  Nervous, Anxious, on Edge 0  Control/stop worrying 0  Worry too much - different things 0  Trouble relaxing 0  Restless 0  Easily annoyed or irritable 0  Afraid - awful might happen 0  Total GAD 7 Score 0

## 2018-11-08 NOTE — Progress Notes (Signed)
I have reviewed this chart and agree with the RN/CMA assessment and management.    Jamal Haskin C Jazari Ober, MD, FACOG Attending Physician, Faculty [facility]  

## 2018-11-09 LAB — CULTURE, OB URINE

## 2018-11-09 LAB — URINE CULTURE, OB REFLEX: Organism ID, Bacteria: NO GROWTH

## 2018-11-12 ENCOUNTER — Encounter: Payer: Self-pay | Admitting: Family Medicine

## 2018-11-20 DIAGNOSIS — Z029 Encounter for administrative examinations, unspecified: Secondary | ICD-10-CM

## 2018-11-20 LAB — OBSTETRIC PANEL, INCLUDING HIV
Antibody Screen: NEGATIVE
Basophils Absolute: 0 10*3/uL (ref 0.0–0.2)
Basos: 1 %
EOS (ABSOLUTE): 0.7 10*3/uL — ABNORMAL HIGH (ref 0.0–0.4)
Eos: 10 %
HEMATOCRIT: 35.1 % (ref 34.0–46.6)
HIV Screen 4th Generation wRfx: NONREACTIVE
Hemoglobin: 12 g/dL (ref 11.1–15.9)
Hepatitis B Surface Ag: NEGATIVE
Immature Grans (Abs): 0 10*3/uL (ref 0.0–0.1)
Immature Granulocytes: 0 %
Lymphocytes Absolute: 1.5 10*3/uL (ref 0.7–3.1)
Lymphs: 24 %
MCH: 29 pg (ref 26.6–33.0)
MCHC: 34.2 g/dL (ref 31.5–35.7)
MCV: 85 fL (ref 79–97)
Monocytes Absolute: 0.4 10*3/uL (ref 0.1–0.9)
Monocytes: 7 %
Neutrophils Absolute: 3.8 10*3/uL (ref 1.4–7.0)
Neutrophils: 58 %
PLATELETS: 289 10*3/uL (ref 150–450)
RBC: 4.14 x10E6/uL (ref 3.77–5.28)
RDW: 14.8 % (ref 11.7–15.4)
RPR Ser Ql: NONREACTIVE
RUBELLA: 13.2 {index} (ref >0.99)
Rh Factor: POSITIVE
WBC: 6.3 10*3/uL (ref 3.4–10.8)

## 2018-11-20 LAB — INHERITEST(R) CF/SMA PANEL

## 2018-11-21 ENCOUNTER — Ambulatory Visit (INDEPENDENT_AMBULATORY_CARE_PROVIDER_SITE_OTHER): Payer: Medicaid Other | Admitting: Obstetrics & Gynecology

## 2018-11-21 ENCOUNTER — Other Ambulatory Visit (HOSPITAL_COMMUNITY)
Admission: RE | Admit: 2018-11-21 | Discharge: 2018-11-21 | Disposition: A | Discharge status: Home / Self Care | Payer: Medicaid Other | Source: Ambulatory Visit | Attending: Obstetrics & Gynecology | Admitting: Obstetrics & Gynecology

## 2018-11-21 ENCOUNTER — Encounter: Payer: Self-pay | Admitting: Obstetrics and Gynecology

## 2018-11-21 VITALS — BP 145/95 | HR 85 | Wt 143.4 lb

## 2018-11-21 DIAGNOSIS — D573 Sickle-cell trait: Secondary | ICD-10-CM

## 2018-11-21 DIAGNOSIS — O10919 Unspecified pre-existing hypertension complicating pregnancy, unspecified trimester: Secondary | ICD-10-CM

## 2018-11-21 DIAGNOSIS — O0992 Supervision of high risk pregnancy, unspecified, second trimester: Secondary | ICD-10-CM

## 2018-11-21 DIAGNOSIS — O0990 Supervision of high risk pregnancy, unspecified, unspecified trimester: Secondary | ICD-10-CM

## 2018-11-21 DIAGNOSIS — Z789 Other specified health status: Secondary | ICD-10-CM | POA: Insufficient documentation

## 2018-11-21 DIAGNOSIS — O09292 Supervision of pregnancy with other poor reproductive or obstetric history, second trimester: Secondary | ICD-10-CM | POA: Diagnosis not present

## 2018-11-21 DIAGNOSIS — O10912 Unspecified pre-existing hypertension complicating pregnancy, second trimester: Secondary | ICD-10-CM

## 2018-11-21 DIAGNOSIS — O09299 Supervision of pregnancy with other poor reproductive or obstetric history, unspecified trimester: Secondary | ICD-10-CM

## 2018-11-21 DIAGNOSIS — Z3A17 17 weeks gestation of pregnancy: Secondary | ICD-10-CM

## 2018-11-21 LAB — POCT URINALYSIS DIP (DEVICE)
Bilirubin Urine: NEGATIVE
Glucose, UA: NEGATIVE mg/dL
Hgb urine dipstick: NEGATIVE
Ketones, ur: NEGATIVE mg/dL
Leukocytes,Ua: NEGATIVE
Nitrite: NEGATIVE
PH: 7 (ref 5.0–8.0)
PROTEIN: NEGATIVE mg/dL
Specific Gravity, Urine: 1.015 (ref 1.005–1.030)
Urobilinogen, UA: 0.2 mg/dL (ref 0.0–1.0)

## 2018-11-21 MED ORDER — LABETALOL HCL 100 MG PO TABS: 200.0000 mg | ORAL_TABLET | Freq: Two times a day (BID) | ORAL | 2 refills | Status: DC

## 2018-11-21 NOTE — Progress Notes (Signed)
  Subjective:    Carrie Duncan is being seen today for her first obstetrical visit.  This is not a planned pregnancy. She is at [redacted]w[redacted]d gestation. Her obstetrical history is significant for chronic hypertension, h/o pre eclampsia. Relationship with FOB: spouse, living together. Patient does intend to breast feed. Pregnancy history fully reviewed.  Patient reports no complaints.  Review of Systems:   Review of Systems  Objective:     BP (!) 145/95   Pulse 85   Wt 143 lb 6.4 oz (65 kg)   LMP 07/20/2018   BMI 27.10 kg/m  Physical Exam  Exam Breathing, conversing, and ambulating normally Well nourished, well hydrated Black female, no apparent distress Live Swahili interpretor present for exam Heart- rrr Lungs- CTAB Abd- benign   Assessment:    Pregnancy: [id number] Patient Active Problem List   Diagnosis Date Noted  . Chronic hypertension during pregnancy, antepartum 11/07/2018  . Supervision of high risk pregnancy, antepartum 11/07/2018  . Paronychia of right thumb 10/21/2017  . 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 Seasonal allergies 02/08/2017  . Low back pain 11/22/2016  . Onychomycosis 10/27/2016  . Essential hypertension   . Hx of preeclampsia, prior pregnancy, currently pregnant 03/29/2013  . Sickle cell trait (HCC) 11/30/2012       Plan:     Initial labs drawn. Prenatal vitamins. Problem list reviewed and updated. AFP3 discussed: ordered. Role of ultrasound in pregnancy discussed; fetal survey: ordered and scheduled for 11-29-18. Amniocentesis discussed: not indicated. Follow up in 4 weeks. Pap smear done today Quad screen today I have increased her labetalol from 100 mg BID to 200 mg BID BP check in 2 weeks HBA1C today Continue baby asa daily  Allie Bossier 11/21/2018

## 2018-11-22 LAB — PROTEIN / CREATININE RATIO, URINE
Creatinine, Urine: 107.4 mg/dL
Protein, Ur: 12.1 mg/dL
Protein/Creat Ratio: 113 mg/g creat (ref 0–200)

## 2018-11-23 LAB — AFP TETRA
DIA Mom Value: 1.09
DIA Value (EIA): 275.78 pg/mL
DSR (BY AGE) 1 IN: 460
DSR (Second Trimester) 1 IN: 531
Gestational Age: 21.6 WEEKS
MSAFP Mom: 1.7
MSAFP: 122.9 ng/mL
MSHCG Mom: 2.11
MSHCG: 49252 m[IU]/mL
Maternal Age At EDD: 32.8 yr
Osb Risk: 1630
Test Results:: NEGATIVE
WEIGHT: 143 [lb_av]
uE3 Mom: 0.24
uE3 Value: 0.69 ng/mL

## 2018-11-23 LAB — COMPREHENSIVE METABOLIC PANEL
ALT: 10 IU/L (ref 0–32)
AST: 18 IU/L (ref 0–40)
Albumin/Globulin Ratio: 1.3 (ref 1.2–2.2)
Albumin: 3.6 g/dL — ABNORMAL LOW (ref 3.8–4.8)
Alkaline Phosphatase: 53 IU/L (ref 39–117)
BUN/Creatinine Ratio: 8 — ABNORMAL LOW (ref 9–23)
BUN: 5 mg/dL — ABNORMAL LOW (ref 6–20)
Bilirubin Total: 0.2 mg/dL (ref 0.0–1.2)
CO2: 19 mmol/L — ABNORMAL LOW (ref 20–29)
Calcium: 9.2 mg/dL (ref 8.7–10.2)
Chloride: 104 mmol/L (ref 96–106)
Creatinine, Ser: 0.63 mg/dL (ref 0.57–1.00)
GFR calc Af Amer: 137 mL/min/{1.73_m2} (ref >59)
GFR calc non Af Amer: 119 mL/min/{1.73_m2} (ref >59)
GLUCOSE: 65 mg/dL (ref 65–99)
Globulin, Total: 2.8 g/dL (ref 1.5–4.5)
Potassium: 4.2 mmol/L (ref 3.5–5.2)
Sodium: 138 mmol/L (ref 134–144)
Total Protein: 6.4 g/dL (ref 6.0–8.5)

## 2018-11-23 LAB — HEMOGLOBIN A1C
Est. average glucose Bld gHb Est-mCnc: 97 mg/dL
Hgb A1c MFr Bld: 5 % (ref 4.8–5.6)

## 2018-11-28 ENCOUNTER — Ambulatory Visit (HOSPITAL_COMMUNITY)
Admission: RE | Admit: 2018-11-28 | Discharge: 2018-11-28 | Disposition: A | Discharge status: Home / Self Care | Payer: Medicaid Other | Source: Ambulatory Visit | Attending: Obstetrics & Gynecology | Admitting: Obstetrics & Gynecology

## 2018-11-28 ENCOUNTER — Ambulatory Visit (HOSPITAL_COMMUNITY): Payer: Medicaid Other | Admitting: *Deleted

## 2018-11-28 ENCOUNTER — Encounter (HOSPITAL_COMMUNITY): Payer: Self-pay

## 2018-11-28 ENCOUNTER — Other Ambulatory Visit (HOSPITAL_COMMUNITY): Payer: Self-pay | Admitting: *Deleted

## 2018-11-28 VITALS — BP 132/87 | HR 79 | Wt 144.6 lb

## 2018-11-28 DIAGNOSIS — O10919 Unspecified pre-existing hypertension complicating pregnancy, unspecified trimester: Secondary | ICD-10-CM | POA: Insufficient documentation

## 2018-11-28 DIAGNOSIS — O0990 Supervision of high risk pregnancy, unspecified, unspecified trimester: Secondary | ICD-10-CM | POA: Insufficient documentation

## 2018-11-28 DIAGNOSIS — Z862 Personal history of diseases of the blood and blood-forming organs and certain disorders involving the immune mechanism: Secondary | ICD-10-CM | POA: Diagnosis not present

## 2018-11-28 DIAGNOSIS — O09292 Supervision of pregnancy with other poor reproductive or obstetric history, second trimester: Secondary | ICD-10-CM

## 2018-11-28 DIAGNOSIS — Z363 Encounter for antenatal screening for malformations: Secondary | ICD-10-CM | POA: Diagnosis not present

## 2018-11-28 DIAGNOSIS — O10012 Pre-existing essential hypertension complicating pregnancy, second trimester: Secondary | ICD-10-CM | POA: Diagnosis not present

## 2018-11-28 DIAGNOSIS — Z3A18 18 weeks gestation of pregnancy: Secondary | ICD-10-CM

## 2018-11-30 ENCOUNTER — Telehealth: Payer: Self-pay

## 2018-11-30 ENCOUNTER — Encounter: Payer: Self-pay | Admitting: Obstetrics & Gynecology

## 2018-11-30 DIAGNOSIS — R87612 Low grade squamous intraepithelial lesion on cytologic smear of cervix (LGSIL): Secondary | ICD-10-CM | POA: Insufficient documentation

## 2018-11-30 LAB — CYTOLOGY - PAP
Chlamydia: NEGATIVE
HPV 16/18/45 genotyping: NEGATIVE
HPV: DETECTED — AB
Neisseria Gonorrhea: NEGATIVE

## 2018-11-30 NOTE — Telephone Encounter (Signed)
-----   Message from Allie Bossier, MD sent at 11/30/2018 11:16 AM EST ----- Please let her know that she needs to schedule a colpo due to a LGSIL pap.

## 2018-11-30 NOTE — Telephone Encounter (Signed)
Called pt to make aware of Pap results of LSIL & need of a Colposcopy. No answer, received message stating the person you are calling does not  Have VM set up yet. Will send Letter.

## 2018-12-05 ENCOUNTER — Encounter: Payer: Medicaid Other | Admitting: Obstetrics & Gynecology

## 2018-12-05 ENCOUNTER — Encounter: Payer: Self-pay | Admitting: Family Medicine

## 2018-12-06 ENCOUNTER — Ambulatory Visit (INDEPENDENT_AMBULATORY_CARE_PROVIDER_SITE_OTHER): Payer: Medicaid Other | Admitting: Obstetrics and Gynecology

## 2018-12-06 ENCOUNTER — Other Ambulatory Visit: Payer: Self-pay

## 2018-12-06 ENCOUNTER — Encounter: Payer: Self-pay | Admitting: Obstetrics and Gynecology

## 2018-12-06 VITALS — BP 146/95 | HR 72 | Wt 144.9 lb

## 2018-12-06 DIAGNOSIS — R87612 Low grade squamous intraepithelial lesion on cytologic smear of cervix (LGSIL): Secondary | ICD-10-CM

## 2018-12-06 DIAGNOSIS — O09299 Supervision of pregnancy with other poor reproductive or obstetric history, unspecified trimester: Secondary | ICD-10-CM

## 2018-12-06 DIAGNOSIS — Z789 Other specified health status: Secondary | ICD-10-CM | POA: Diagnosis not present

## 2018-12-06 DIAGNOSIS — O10912 Unspecified pre-existing hypertension complicating pregnancy, second trimester: Secondary | ICD-10-CM | POA: Diagnosis not present

## 2018-12-06 DIAGNOSIS — O10919 Unspecified pre-existing hypertension complicating pregnancy, unspecified trimester: Secondary | ICD-10-CM

## 2018-12-06 DIAGNOSIS — O0990 Supervision of high risk pregnancy, unspecified, unspecified trimester: Secondary | ICD-10-CM

## 2018-12-06 DIAGNOSIS — O09292 Supervision of pregnancy with other poor reproductive or obstetric history, second trimester: Secondary | ICD-10-CM | POA: Diagnosis not present

## 2018-12-06 DIAGNOSIS — Z3A19 19 weeks gestation of pregnancy: Secondary | ICD-10-CM

## 2018-12-06 NOTE — Progress Notes (Signed)
   PRENATAL VISIT NOTE  Subjective:  Carrie Duncan is a 33 y.o. [id number] at [redacted]w[redacted]d being seen today for ongoing prenatal care.  She is currently monitored for the following issues for this high-risk pregnancy and has Sickle cell trait (HCC); Hx of preeclampsia, prior pregnancy, currently pregnant; Essential hypertension; Onychomycosis; Low back pain; Seasonal allergies; Complicated migraine; Muscle tension headache; Paronychia of right thumb; Chronic hypertension during pregnancy, antepartum; Supervision of high risk pregnancy, antepartum; Language barrier; and LGSIL on Pap smear of cervix on their problem list.  Patient reports no complaints.  Contractions: Not present. Vag. Bleeding: None.  Movement: Present. Denies leaking of fluid.   The following portions of the patient's history were reviewed and updated as appropriate: allergies, current medications, past family history, past medical history, past social history, past surgical history and problem list.   Objective:   Vitals:   12/06/18 1050 12/06/18 1052  BP: (!) 137/96 (!) 146/95  Pulse: 72 72  Weight: 144 lb 14.4 oz (65.7 kg)     Fetal Status: Fetal Heart Rate (bpm): 161   Movement: Present     General:  Alert, oriented and cooperative. Patient is in no acute distress.  Skin: Skin is warm and dry. No rash noted.   Cardiovascular: Normal heart rate noted  Respiratory: Normal respiratory effort, no problems with respiration noted  Abdomen: Soft, gravid, appropriate for gestational age.  Pain/Pressure: Present     Pelvic: Cervical exam deferred        Extremities: Normal range of motion.  Edema: None  Mental Status: Normal mood and affect. Normal behavior. Normal judgment and thought content.   Assessment and Plan:  Pregnancy: [id number] at [redacted]w[redacted]d 1. Supervision of high risk pregnancy, antepartum Anatomy normal  2. Language barrier Declined interpretor  3. Hx of preeclampsia, prior pregnancy, currently pregnant On baby ASA  4. Chronic hypertension during pregnancy, antepartum On labetalol 200 mg BID Last growth WNL, f/u scheduled  5. LGSIL on Pap smear of cervix   Preterm labor symptoms and general obstetric precautions including but not limited to vaginal bleeding, contractions, leaking of fluid and fetal movement were reviewed in detail with the patient. Please refer to After Visit Summary for other counseling recommendations.   Return in about 2 weeks (around 12/20/2018) for OB visit (MD).  Future Appointments  Date Time Provider Department Center  01/02/2019  8:50 AM [facility] NURSE [facility]  01/02/2019  9:00 AM [facility] [place] 3 [facility]    Conan Bowens, MD

## 2018-12-06 NOTE — Patient Instructions (Signed)
Use the following websites (and others) to help learn more about your contraception options and find the method that is right for you!  - The Centers for Disease Control (CDC) website: https://www.cdc.gov/reproductivehealth/contraception/index.htm  - [facility] website: https://www.plannedparenthood.org/learn/birth-control  - Bedsider.org: https://www.bedsider.org/methods   Go to bedsider.org for more information!  Contraception Choices Contraception, also called birth control, refers to methods or devices that prevent pregnancy. Hormonal methods Contraceptive implant A contraceptive implant is a thin, plastic tube that contains a hormone. It is inserted into the upper part of the arm. It can remain in place for up to 3 years. Progestin-only injections Progestin-only injections are injections of progestin, a synthetic form of the hormone progesterone. They are given every 3 months by a health care provider. Birth control pills Birth control pills are pills that contain hormones that prevent pregnancy. They must be taken once a day, preferably at the same time each day. Birth control patch The birth control patch contains hormones that prevent pregnancy. It is placed on the skin and must be changed once a week for three weeks and removed on the fourth week. A prescription is needed to use this method of contraception. Vaginal ring A vaginal ring contains hormones that prevent pregnancy. It is placed in the vagina for three weeks and removed on the fourth week. After that, the process is repeated with a new ring. A prescription is needed to use this method of contraception. Emergency contraceptive Emergency contraceptives prevent pregnancy after unprotected sex. They come in pill form and can be taken up to 5 days after sex. They work best the sooner they are taken after having sex. Most emergency contraceptives are available without a prescription. This method should not be used as  your only form of birth control. Barrier methods Female condom A female condom is a thin sheath that is worn over the penis during sex. Condoms keep sperm from going inside a woman's body. They can be used with a spermicide to increase their effectiveness. They should be disposed after a single use. Female condom A female condom is a soft, loose-fitting sheath that is put into the vagina before sex. The condom keeps sperm from going inside a woman's body. They should be disposed after a single use.  Intrauterine contraception Intrauterine device (IUD) An IUD is a T-shaped device that is put in a woman's uterus. There are two types:  Hormone IUD.This type contains progestin, a synthetic form of the hormone progesterone. This type can stay in place for 3-5 years.  Copper IUD.This type is wrapped in copper wire. It can stay in place for 10 years.  Permanent methods of contraception Female tubal ligation In this method, a woman's fallopian tubes are sealed, tied, or blocked during surgery to prevent eggs from traveling to the uterus.  Female sterilization This is a procedure to tie off the tubes that carry sperm (vasectomy). After the procedure, the man can still ejaculate fluid (semen).  Summary  Contraception, also called birth control, means methods or devices that prevent pregnancy.  Hormonal methods of contraception include implants, injections, pills, patches, vaginal rings, and emergency contraceptives.  Barrier methods of contraception can include female condoms, female condoms, diaphragms, cervical caps, sponges, and spermicides.  There are two types of IUDs (intrauterine devices). An IUD can be put in a woman's uterus to prevent pregnancy for 3-5 years.  Permanent sterilization can be done through a procedure for males, females, or both. This information is not intended to replace advice given to you   by your health care provider. Make sure you discuss any questions you have with your  health care provider. Document Released: 09/13/2005 Document Revised: 10/16/2016 Document Reviewed: 10/16/2016 Elsevier Interactive Patient Education  2018 Elsevier Inc.  

## 2018-12-19 ENCOUNTER — Encounter: Payer: Self-pay | Admitting: *Deleted

## 2018-12-20 ENCOUNTER — Telehealth: Payer: Self-pay | Admitting: Obstetrics & Gynecology

## 2018-12-20 NOTE — Telephone Encounter (Signed)
Called the patient however no options to leave a voicemail as the voicemail was not setup yet.

## 2018-12-21 ENCOUNTER — Telehealth: Payer: Self-pay | Admitting: Obstetrics and Gynecology

## 2018-12-21 ENCOUNTER — Encounter: Payer: Self-pay | Admitting: Obstetrics and Gynecology

## 2018-12-21 ENCOUNTER — Other Ambulatory Visit: Payer: Self-pay

## 2018-12-21 ENCOUNTER — Ambulatory Visit (INDEPENDENT_AMBULATORY_CARE_PROVIDER_SITE_OTHER): Payer: Medicaid Other | Admitting: Obstetrics and Gynecology

## 2018-12-21 DIAGNOSIS — Z3A22 22 weeks gestation of pregnancy: Secondary | ICD-10-CM | POA: Diagnosis not present

## 2018-12-21 DIAGNOSIS — O0992 Supervision of high risk pregnancy, unspecified, second trimester: Secondary | ICD-10-CM | POA: Diagnosis not present

## 2018-12-21 DIAGNOSIS — O10919 Unspecified pre-existing hypertension complicating pregnancy, unspecified trimester: Secondary | ICD-10-CM

## 2018-12-21 DIAGNOSIS — O09299 Supervision of pregnancy with other poor reproductive or obstetric history, unspecified trimester: Secondary | ICD-10-CM

## 2018-12-21 DIAGNOSIS — O09292 Supervision of pregnancy with other poor reproductive or obstetric history, second trimester: Secondary | ICD-10-CM

## 2018-12-21 DIAGNOSIS — Z789 Other specified health status: Secondary | ICD-10-CM

## 2018-12-21 DIAGNOSIS — O10912 Unspecified pre-existing hypertension complicating pregnancy, second trimester: Secondary | ICD-10-CM

## 2018-12-21 DIAGNOSIS — O0990 Supervision of high risk pregnancy, unspecified, unspecified trimester: Secondary | ICD-10-CM

## 2018-12-21 DIAGNOSIS — R87612 Low grade squamous intraepithelial lesion on cytologic smear of cervix (LGSIL): Secondary | ICD-10-CM

## 2018-12-21 NOTE — Telephone Encounter (Signed)
Called patient to inform her of telehealth visit. Patient understood she would be getting a call. Used interpreter [phone].

## 2018-12-21 NOTE — Patient Instructions (Signed)
Coronavirus (COVID-19) Are you at risk?  Are you at risk for the Coronavirus (COVID-19)?  To be considered HIGH RISK for Coronavirus (COVID-19), you have to meet the following criteria:  . Traveled to [place], [place], [place], [place] or [place]; or in the [place] to [place], [place], [place], or [place]; and have fever, cough, and shortness of breath within the last 2 weeks of travel OR . Been in close contact with a person diagnosed with COVID-19 within the last 2 weeks and have fever, cough, and shortness of breath . IF YOU DO NOT MEET THESE CRITERIA, YOU ARE CONSIDERED LOW RISK FOR COVID-19.  What to do if you are HIGH RISK for COVID-19?  . If you are having a medical emergency, call 911. . Seek medical care right away. Before you go to a doctor's office, urgent care or emergency department, call ahead and tell them about your recent travel, contact with someone diagnosed with COVID-19, and your symptoms. You should receive instructions from your physician's office regarding next steps of care.  . When you arrive at healthcare provider, tell the healthcare staff immediately you have returned from visiting [place], [place], [place], [place] or [place]; or traveled in the [place] to [place], [place], [place], or [place]; in the last two weeks or you have been in close contact with a person diagnosed with COVID-19 in the last 2 weeks.   . Tell the health care staff about your symptoms: fever, cough and shortness of breath. . After you have been seen by a medical provider, you will be either: o Tested for (COVID-19) and discharged home on quarantine except to seek medical care if symptoms worsen, and asked to  - Stay home and avoid contact with others until you get your results (4-5 days)  - Avoid travel on public transportation if possible (such as bus, train, or airplane) or o Sent to the Emergency Department by EMS for evaluation, COVID-19 testing, and possible  admission depending on your condition and test results.  What to do if you are LOW RISK for COVID-19?  Reduce your risk of any infection by using the same precautions used for avoiding the common cold or flu:  . Wash your hands often with soap and warm water for at least 20 seconds.  If soap and water are not readily available, use an alcohol-based hand sanitizer with at least 60% alcohol.  . If coughing or sneezing, cover your mouth and nose by coughing or sneezing into the elbow areas of your shirt or coat, into a tissue or into your sleeve (not your hands). . Avoid shaking hands with others and consider head nods or verbal greetings only. . Avoid touching your eyes, nose, or mouth with unwashed hands.  . Avoid close contact with people who are sick. . Avoid places or events with large numbers of people in one location, like concerts or sporting events. . Carefully consider travel plans you have or are making. . If you are planning any travel outside or inside the [place], visit the CDC's Travelers' Health webpage for the latest health notices. . If you have some symptoms but not all symptoms, continue to monitor at home and seek medical attention if your symptoms worsen. . If you are having a medical emergency, call 911.   ADDITIONAL HEALTHCARE OPTIONS FOR PATIENTS  [facility] / e-Visit: [email or web address]         [facility]: [phone]     Urgent Care: [phone]                   [facility]: [phone]   

## 2018-12-21 NOTE — Progress Notes (Signed)
   TELEHEALTH VIRTUAL OBSTETRICS VISIT ENCOUNTER NOTE  I connected with Carrie Duncan on 12/21/18 at 10:35 AM EDT by telephone at home and verified that I am speaking with the correct person using two identifiers.   I discussed the limitations, risks, security and privacy concerns of performing an evaluation and management service by telephone and the availability of in person appointments. I also discussed with the patient that there may be a patient responsible charge related to this service. The patient expressed understanding and agreed to proceed.  Subjective:  Carrie Duncan is a 33 y.o. O1R7116 at [redacted]w[redacted]d being followed for ongoing prenatal care.  She is currently monitored for the following issues for this high-risk pregnancy and has Sickle cell trait (HCC); Hx of preeclampsia, prior pregnancy, currently pregnant; Essential hypertension; Onychomycosis; Low back pain; Seasonal allergies; Complicated migraine; Muscle tension headache; Paronychia of right thumb; Chronic hypertension during pregnancy, antepartum; Supervision of high risk pregnancy, antepartum; Language barrier; and LGSIL on Pap smear of cervix on their problem list.  Patient reports no complaints. Reports fetal movement. Denies any contractions, bleeding or leaking of fluid.   The following portions of the patient's history were reviewed and updated as appropriate: allergies, current medications, past family history, past medical history, past social history, past surgical history and problem list.   Objective:   General:  Alert, oriented and cooperative.   Mental Status: Normal mood and affect perceived. Normal judgment and thought content.  Rest of physical exam deferred due to type of encounter  Assessment and Plan:  Pregnancy: G5P3103 at [redacted]w[redacted]d  1. Supervision of high risk pregnancy, antepartum - Anatomy normal - Requesting letter to be taken out of work, reviewed that we are not taking pregnant patients out of  work now, but will let her know if that changes per guidance  2. Language barrier Swahili interpretor used  3. LGSIL on Pap smear of cervix colpo pp  4. Hx of preeclampsia, prior pregnancy, currently pregnant cont baby ASA  5. Chronic hypertension during pregnancy, antepartum - Does not have cuff at home, patient to be enrolled in babyscripts BUT WILL CALL WITH NEW EMAIL ADDRESS, needs order placed for babyscripts at that time - Was started on labetalol 200 mg BID but has not started yet - Encouraged her to pick up at pharmacy and start - Has f/u growth scheduled   Preterm labor symptoms and general obstetric precautions including but not limited to vaginal bleeding, contractions, leaking of fluid and fetal movement were reviewed in detail with the patient.  I discussed the assessment and treatment plan with the patient. The patient was provided an opportunity to ask questions and all were answered. The patient agreed with the plan and demonstrated an understanding of the instructions. The patient was advised to call back or seek an in-person office evaluation/go to MAU at [facility] for any urgent or concerning symptoms. Please refer to After Visit Summary for other counseling recommendations.   Return in about 2 weeks (around 01/04/2019) for OB visit (MD).  Future Appointments  Date Time Provider Department Center  01/02/2019  8:50 AM WH-MFC NURSE WH-MFC MFC-US  01/02/2019  9:00 AM WH-MFC [place] 3 WH-MFCUS MFC-US    Conan Bowens, MD Center for [facility], [facility]

## 2019-01-02 ENCOUNTER — Ambulatory Visit (HOSPITAL_COMMUNITY): Payer: Medicaid Other

## 2019-01-12 ENCOUNTER — Ambulatory Visit (INDEPENDENT_AMBULATORY_CARE_PROVIDER_SITE_OTHER): Payer: Medicaid Other | Admitting: Obstetrics & Gynecology

## 2019-01-12 ENCOUNTER — Encounter: Payer: Self-pay | Admitting: Obstetrics & Gynecology

## 2019-01-12 ENCOUNTER — Other Ambulatory Visit: Payer: Self-pay

## 2019-01-12 DIAGNOSIS — O0992 Supervision of high risk pregnancy, unspecified, second trimester: Secondary | ICD-10-CM

## 2019-01-12 DIAGNOSIS — O09293 Supervision of pregnancy with other poor reproductive or obstetric history, third trimester: Secondary | ICD-10-CM | POA: Diagnosis not present

## 2019-01-12 DIAGNOSIS — Z3A25 25 weeks gestation of pregnancy: Secondary | ICD-10-CM

## 2019-01-12 DIAGNOSIS — O0990 Supervision of high risk pregnancy, unspecified, unspecified trimester: Secondary | ICD-10-CM

## 2019-01-12 MED ORDER — LABETALOL HCL 300 MG PO TABS: 300.0000 mg | ORAL_TABLET | Freq: Two times a day (BID) | ORAL | 3 refills | Status: DC

## 2019-01-12 NOTE — Progress Notes (Signed)
   TELEHEALTH VIRTUAL OBSTETRICS VISIT ENCOUNTER NOTE  I connected with Carrie Duncan on 01/12/19 at 10:15 AM EDT by telephone at home and verified that I am speaking with the correct person using two identifiers.   I discussed the limitations, risks, security and privacy concerns of performing an evaluation and management service by telephone and the availability of in person appointments. I also discussed with the patient that there may be a patient responsible charge related to this service. The patient expressed understanding and agreed to proceed.  Subjective:  Carrie Duncan is a 33 y.o. T2I7124 at [redacted]w[redacted]d being followed for ongoing prenatal care.  She is currently monitored for the following issues for this high-risk pregnancy and has Sickle cell trait (HCC); Hx of preeclampsia, prior pregnancy, currently pregnant; Essential hypertension; Onychomycosis; Low back pain; Seasonal allergies; Complicated migraine; Muscle tension headache; Paronychia of right thumb; Chronic hypertension during pregnancy, antepartum; Supervision of high risk pregnancy, antepartum; Language barrier; and LGSIL on Pap smear of cervix on their problem list.  Patient reports no complaints. Reports fetal movement. Denies any contractions, bleeding or leaking of fluid.   The following portions of the patient's history were reviewed and updated as appropriate: allergies, current medications, past family history, past medical history, past social history, past surgical history and problem list.   Objective:   General:  Alert, oriented and cooperative.   Mental Status: Normal mood and affect perceived. Normal judgment and thought content.  Rest of physical exam deferred due to type of encounter  Assessment and Plan:  Pregnancy: [id number] at [redacted]w[redacted]d 1. Supervision of high risk pregnancy, antepartum Elevated BP, 140s/90's last week, increase her dose - labetalol (NORMODYNE) 300 MG tablet; Take 1 tablet (300 mg total) by  mouth 2 (two) times daily.  Dispense: 60 tablet; Refill: 3  Preterm labor symptoms and general obstetric precautions including but not limited to vaginal bleeding, contractions, leaking of fluid and fetal movement were reviewed in detail with the patient.  I discussed the assessment and treatment plan with the patient. The patient was provided an opportunity to ask questions and all were answered. The patient agreed with the plan and demonstrated an understanding of the instructions. The patient was advised to call back or seek an in-person office evaluation/go to MAU at [facility] for any urgent or concerning symptoms. Please refer to After Visit Summary for other counseling recommendations.   I provided 10 minutes of non-face-to-face time during this encounter.  Return in about 1 week (around 01/19/2019) for 2 hr GTT.  Future Appointments  Date Time Provider Department Center  01/23/2019 11:20 AM WH-MFC NURSE WH-MFC MFC-US  01/23/2019 11:30 AM WH-MFC [place] 5 WH-MFCUS MFC-US    Scheryl Darter, MD Center for [facility], [facility]

## 2019-01-12 NOTE — Progress Notes (Signed)
Telehealth Swahili Inter. Id# [phone]

## 2019-01-12 NOTE — Progress Notes (Signed)
Tele visit

## 2019-01-12 NOTE — Patient Instructions (Signed)

## 2019-01-23 ENCOUNTER — Encounter (HOSPITAL_COMMUNITY): Payer: Self-pay

## 2019-01-23 ENCOUNTER — Encounter (HOSPITAL_COMMUNITY): Payer: Self-pay | Admitting: *Deleted

## 2019-01-23 ENCOUNTER — Ambulatory Visit (HOSPITAL_COMMUNITY): Payer: Medicaid Other

## 2019-01-23 ENCOUNTER — Inpatient Hospital Stay (HOSPITAL_COMMUNITY)
Admission: AD | Admit: 2019-01-23 | Discharge: 2019-01-31 | DRG: 788 | Disposition: A | Discharge status: Home / Self Care | Payer: Medicaid Other | Attending: Obstetrics and Gynecology | Admitting: Obstetrics and Gynecology

## 2019-01-23 ENCOUNTER — Other Ambulatory Visit: Payer: Self-pay

## 2019-01-23 ENCOUNTER — Ambulatory Visit (HOSPITAL_COMMUNITY): Payer: Medicaid Other | Admitting: *Deleted

## 2019-01-23 ENCOUNTER — Other Ambulatory Visit (HOSPITAL_COMMUNITY): Payer: Self-pay | Admitting: Maternal & Fetal Medicine

## 2019-01-23 ENCOUNTER — Ambulatory Visit (HOSPITAL_COMMUNITY)
Admission: RE | Admit: 2019-01-23 | Discharge: 2019-01-23 | Disposition: A | Discharge status: Home / Self Care | Payer: Medicaid Other | Source: Ambulatory Visit | Attending: Maternal & Fetal Medicine | Admitting: Maternal & Fetal Medicine

## 2019-01-23 VITALS — BP 145/98 | HR 86 | Temp 98.7°F

## 2019-01-23 VITALS — BP 160/108 | HR 70

## 2019-01-23 DIAGNOSIS — O0990 Supervision of high risk pregnancy, unspecified, unspecified trimester: Secondary | ICD-10-CM

## 2019-01-23 DIAGNOSIS — O365920 Maternal care for other known or suspected poor fetal growth, second trimester, not applicable or unspecified: Secondary | ICD-10-CM | POA: Insufficient documentation

## 2019-01-23 DIAGNOSIS — O10912 Unspecified pre-existing hypertension complicating pregnancy, second trimester: Secondary | ICD-10-CM | POA: Diagnosis not present

## 2019-01-23 DIAGNOSIS — Z789 Other specified health status: Secondary | ICD-10-CM

## 2019-01-23 DIAGNOSIS — O365929 Maternal care for other known or suspected poor fetal growth, second trimester, other fetus: Secondary | ICD-10-CM

## 2019-01-23 DIAGNOSIS — O10919 Unspecified pre-existing hypertension complicating pregnancy, unspecified trimester: Secondary | ICD-10-CM | POA: Insufficient documentation

## 2019-01-23 DIAGNOSIS — D573 Sickle-cell trait: Secondary | ICD-10-CM | POA: Diagnosis present

## 2019-01-23 DIAGNOSIS — R87612 Low grade squamous intraepithelial lesion on cytologic smear of cervix (LGSIL): Secondary | ICD-10-CM

## 2019-01-23 DIAGNOSIS — O10012 Pre-existing essential hypertension complicating pregnancy, second trimester: Secondary | ICD-10-CM

## 2019-01-23 DIAGNOSIS — Z3A26 26 weeks gestation of pregnancy: Secondary | ICD-10-CM

## 2019-01-23 DIAGNOSIS — Z862 Personal history of diseases of the blood and blood-forming organs and certain disorders involving the immune mechanism: Secondary | ICD-10-CM

## 2019-01-23 DIAGNOSIS — O1002 Pre-existing essential hypertension complicating childbirth: Secondary | ICD-10-CM | POA: Diagnosis present

## 2019-01-23 DIAGNOSIS — O9902 Anemia complicating childbirth: Secondary | ICD-10-CM | POA: Diagnosis not present

## 2019-01-23 DIAGNOSIS — O1414 Severe pre-eclampsia complicating childbirth: Secondary | ICD-10-CM | POA: Diagnosis not present

## 2019-01-23 DIAGNOSIS — Z603 Acculturation difficulty: Secondary | ICD-10-CM | POA: Diagnosis present

## 2019-01-23 DIAGNOSIS — O365930 Maternal care for other known or suspected poor fetal growth, third trimester, not applicable or unspecified: Secondary | ICD-10-CM | POA: Diagnosis not present

## 2019-01-23 DIAGNOSIS — O321XX0 Maternal care for breech presentation, not applicable or unspecified: Secondary | ICD-10-CM | POA: Diagnosis present

## 2019-01-23 DIAGNOSIS — Z758 Other problems related to medical facilities and other health care: Secondary | ICD-10-CM

## 2019-01-23 DIAGNOSIS — Z362 Encounter for other antenatal screening follow-up: Secondary | ICD-10-CM | POA: Diagnosis not present

## 2019-01-23 DIAGNOSIS — O09292 Supervision of pregnancy with other poor reproductive or obstetric history, second trimester: Secondary | ICD-10-CM

## 2019-01-23 DIAGNOSIS — Z364 Encounter for antenatal screening for fetal growth retardation: Secondary | ICD-10-CM | POA: Diagnosis not present

## 2019-01-23 DIAGNOSIS — O114 Pre-existing hypertension with pre-eclampsia, complicating childbirth: Secondary | ICD-10-CM | POA: Diagnosis not present

## 2019-01-23 DIAGNOSIS — Z3A27 27 weeks gestation of pregnancy: Secondary | ICD-10-CM | POA: Diagnosis not present

## 2019-01-23 DIAGNOSIS — O365990 Maternal care for other known or suspected poor fetal growth, unspecified trimester, not applicable or unspecified: Secondary | ICD-10-CM

## 2019-01-23 DIAGNOSIS — O09299 Supervision of pregnancy with other poor reproductive or obstetric history, unspecified trimester: Secondary | ICD-10-CM

## 2019-01-23 DIAGNOSIS — O288 Other abnormal findings on antenatal screening of mother: Secondary | ICD-10-CM | POA: Diagnosis not present

## 2019-01-23 LAB — PROTEIN / CREATININE RATIO, URINE
Creatinine, Urine: 91.1 mg/dL
Protein Creatinine Ratio: 0.09 mg/mg{Cre} (ref 0.00–0.15)
Total Protein, Urine: 8 mg/dL

## 2019-01-23 LAB — COMPREHENSIVE METABOLIC PANEL
ALT: 13 U/L (ref 0–44)
AST: 20 U/L (ref 15–41)
Albumin: 3.4 g/dL — ABNORMAL LOW (ref 3.5–5.0)
Alkaline Phosphatase: 93 U/L (ref 38–126)
Anion gap: 9 (ref 5–15)
BUN: 7 mg/dL (ref 6–20)
CO2: 22 mmol/L (ref 22–32)
Calcium: 9 mg/dL (ref 8.9–10.3)
Chloride: 106 mmol/L (ref 98–111)
Creatinine, Ser: 0.63 mg/dL (ref 0.44–1.00)
GFR calc Af Amer: >60 mL/min (ref >60)
GFR calc non Af Amer: >60 mL/min (ref >60)
Glucose, Bld: 77 mg/dL (ref 70–99)
Potassium: 3.9 mmol/L (ref 3.5–5.1)
Sodium: 137 mmol/L (ref 135–145)
Total Bilirubin: 0.5 mg/dL (ref 0.3–1.2)
Total Protein: 6.8 g/dL (ref 6.5–8.1)

## 2019-01-23 LAB — CBC
HCT: 38.9 % (ref 36.0–46.0)
Hemoglobin: 13.7 g/dL (ref 12.0–15.0)
MCH: 30.1 pg (ref 26.0–34.0)
MCHC: 35.2 g/dL (ref 30.0–36.0)
MCV: 85.5 fL (ref 80.0–100.0)
Platelets: 264 10*3/uL (ref 150–400)
RBC: 4.55 MIL/uL (ref 3.87–5.11)
RDW: 12.9 % (ref 11.5–15.5)
WBC: 9 10*3/uL (ref 4.0–10.5)
nRBC: 0 % (ref 0.0–0.2)

## 2019-01-23 LAB — TYPE AND SCREEN
ABO/RH(D): O POS
Antibody Screen: NEGATIVE

## 2019-01-23 MED ORDER — SODIUM CHLORIDE 0.9% FLUSH: 3.0000 mL | INTRAVENOUS | Status: DC | PRN

## 2019-01-23 MED ORDER — LACTATED RINGERS IV SOLN
INTRAVENOUS | Status: DC
  Administered 2019-01-23 – 2019-01-27 (×12): via INTRAVENOUS

## 2019-01-23 MED ORDER — SODIUM CHLORIDE 0.9 % IV SOLN: 250.0000 mL | INTRAVENOUS | Status: DC | PRN

## 2019-01-23 MED ORDER — BETAMETHASONE SOD PHOS & ACET 6 (3-3) MG/ML IJ SUSP
12.0000 mg | INTRAMUSCULAR | Status: AC
  Administered 2019-01-24: 12 mg via INTRAMUSCULAR
  Filled 2019-01-23 (×3): qty 2

## 2019-01-23 MED ORDER — LABETALOL HCL 200 MG PO TABS
300.0000 mg | ORAL_TABLET | Freq: Three times a day (TID) | ORAL | Status: DC
  Administered 2019-01-23 – 2019-01-25 (×6): 300 mg via ORAL
  Filled 2019-01-23 (×6): qty 1

## 2019-01-23 MED ORDER — CALCIUM CARBONATE ANTACID 500 MG PO CHEW
2.0000 | CHEWABLE_TABLET | ORAL | Status: DC | PRN
  Administered 2019-01-25: 400 mg via ORAL
  Filled 2019-01-23: qty 2

## 2019-01-23 MED ORDER — DOCUSATE SODIUM 100 MG PO CAPS
100.0000 mg | ORAL_CAPSULE | Freq: Every day | ORAL | Status: DC
  Administered 2019-01-24 – 2019-01-27 (×3): 100 mg via ORAL
  Filled 2019-01-23 (×4): qty 1

## 2019-01-23 MED ORDER — BETAMETHASONE SOD PHOS & ACET 6 (3-3) MG/ML IJ SUSP
12.0000 mg | INTRAMUSCULAR | Status: DC
  Administered 2019-01-23: 14:00:00 12 mg via INTRAMUSCULAR

## 2019-01-23 MED ORDER — LACTATED RINGERS IV BOLUS
1000.0000 mL | Freq: Once | INTRAVENOUS | Status: AC
  Administered 2019-01-23: 1000 mL via INTRAVENOUS

## 2019-01-23 MED ORDER — SODIUM CHLORIDE 0.9% FLUSH
3.0000 mL | Freq: Two times a day (BID) | INTRAVENOUS | Status: DC
  Administered 2019-01-27 (×2): 3 mL via INTRAVENOUS

## 2019-01-23 MED ORDER — PRENATAL MULTIVITAMIN CH
1.0000 | ORAL_TABLET | Freq: Every day | ORAL | Status: DC
  Administered 2019-01-24 – 2019-01-27 (×3): 1 via ORAL
  Filled 2019-01-23 (×4): qty 1

## 2019-01-23 MED ORDER — ACETAMINOPHEN 325 MG PO TABS
650.0000 mg | ORAL_TABLET | ORAL | Status: DC | PRN
  Administered 2019-01-24 – 2019-01-25 (×2): 650 mg via ORAL
  Filled 2019-01-23 (×2): qty 2

## 2019-01-23 NOTE — Procedures (Signed)
Carrie Duncan Aug 18, 1986 [redacted]w[redacted]d  Fetus A Non-Stress Test Interpretation for 01/23/19  Indication: IUGR  Fetal Heart Rate A Mode: External Baseline Rate (A): 150 bpm Variability: Minimal Accelerations: None Decelerations: None Multiple birth?: No  Uterine Activity Mode: Toco Contraction Frequency (min): none noted  Interpretation (Fetal Testing) Nonstress Test Interpretation: Non-reactive Comments: FHR tracing rev'd by Dr. Grace Bushy
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
## 2019-01-23 NOTE — MAU Note (Signed)
Sent over from MFM, CHTN, on meds BP elevated.  Received a dose of BMZ.  Denies HA, visual changes, epigastric pain, no increase in swelling in hands or feet.

## 2019-01-24 ENCOUNTER — Other Ambulatory Visit (HOSPITAL_COMMUNITY): Payer: Self-pay | Admitting: *Deleted

## 2019-01-24 ENCOUNTER — Ambulatory Visit (HOSPITAL_COMMUNITY): Payer: Medicaid Other

## 2019-01-24 DIAGNOSIS — O365920 Maternal care for other known or suspected poor fetal growth, second trimester, not applicable or unspecified: Secondary | ICD-10-CM

## 2019-01-24 DIAGNOSIS — O10912 Unspecified pre-existing hypertension complicating pregnancy, second trimester: Secondary | ICD-10-CM

## 2019-01-24 DIAGNOSIS — Z3A26 26 weeks gestation of pregnancy: Secondary | ICD-10-CM

## 2019-01-24 LAB — ABO/RH: ABO/RH(D): O POS

## 2019-01-24 LAB — RPR: RPR Ser Ql: NONREACTIVE

## 2019-01-24 NOTE — Progress Notes (Signed)
Patient ID: Carrie Duncan, female   DOB: 1986-09-24, 33 y.o.   MRN: [id number] [facility]) NOTE  Carrie Duncan is a 33 y.o. [id number] at [redacted]w[redacted]d [facility]    Fetal presentation is breech. Length of Stay:  1  Days  Date of admission:01/23/2019  Subjective: Patient reports feeling well, reporting good fetal movement without contractions, vaginal bleeding or leakage of fluid Patient reports the fetal movement as active. Patient reports uterine contraction  activity as none. Patient reports  vaginal bleeding as none. Patient describes fluid per vagina as None.  Vitals:  Blood pressure (!) 140/92, pulse 96, temperature 98.8 F (37.1 C), temperature source Oral, resp. rate 17, height _0  (1.575 m), weight 148 lb 12 oz (67.5 kg), last menstrual period 07/20/2018, SpO2 100 %, currently breastfeeding. Vitals:   01/24/19 0448 01/24/19 0450 01/24/19 0628 01/24/19 0825  BP: 128/72  131/79 (!) 140/92  Pulse: 90  88 96  Resp: _1 Temp: 98.6 F (37 C)   98.8 F (37.1 C)  TempSrc: Oral   Oral  SpO2: 100% 99%  100%  Weight:      Height:       Physical Examination: GENERAL: Well-developed, well-nourished female in no acute distress.  LUNGS: Clear to auscultation bilaterally.  HEART: Regular rate and rhythm. ABDOMEN: Soft, nontender,gravid PELVIC: Not indicated EXTREMITIES: No cyanosis, clubbing, or edema, 2+ distal pulses.   Fetal Monitoring:  Baseline 150, min variability, no accels, occasional decels every hour   equivocal  Labs:  Results for orders placed or performed during the hospital encounter of 01/23/19 (from the past 24 hour(s))  CBC   Collection Time: 01/23/19  3:21 PM  Result Value Ref Range   WBC 9.0 4.0 - 10.5 K/uL   RBC 4.55 3.87 - 5.11 MIL/uL   Hemoglobin 13.7 12.0 - 15.0 g/dL   HCT 38.9 36.0 - 46.0 %   MCV 85.5 80.0 - 100.0 fL   MCH 30.1 26.0 - 34.0 pg   MCHC 35.2 30.0 - 36.0 g/dL    RDW 12.9 11.5 - 15.5 %   Platelets 264 150 - 400 K/uL   nRBC 0.0 0.0 - 0.2 %  Comprehensive metabolic panel   Collection Time: 01/23/19  3:21 PM  Result Value Ref Range   Sodium 137 135 - 145 mmol/L   Potassium 3.9 3.5 - 5.1 mmol/L   Chloride 106 98 - 111 mmol/L   CO2 22 22 - 32 mmol/L   Glucose, Bld 77 70 - 99 mg/dL   BUN 7 6 - 20 mg/dL   Creatinine, Ser 0.63 0.44 - 1.00 mg/dL   Calcium 9.0 8.9 - 10.3 mg/dL   Total Protein 6.8 6.5 - 8.1 g/dL   Albumin 3.4 (L) 3.5 - 5.0 g/dL   AST 20 15 - 41 U/L   ALT 13 0 - 44 U/L   Alkaline Phosphatase 93 38 - 126 U/L   Total Bilirubin 0.5 0.3 - 1.2 mg/dL   GFR calc non Af Amer >60 >60 mL/min   GFR calc Af Amer >60 >60 mL/min   Anion gap 9 5 - 15  Protein / creatinine ratio, urine   Collection Time: 01/23/19  3:49 PM  Result Value Ref Range   Creatinine, Urine 91.10 mg/dL   Total Protein, Urine 8 mg/dL   Protein Creatinine Ratio 0.09 0.00 - 0.15 mg/mg[Cre]  ABO/Rh   Collection Time: 01/23/19  5:20 PM  Result  Value Ref Range   ABO/RH(D) O POS    No rh immune globuloin      NOT A RH IMMUNE GLOBULIN CANDIDATE, PT RH POSITIVE Performed at [facility] [address], [place], [place] [address]   Type and screen [place]   Collection Time: 01/23/19  5:22 PM  Result Value Ref Range   ABO/RH(D) O POS    Antibody Screen NEG    Sample Expiration      01/26/2019 Performed at [facility], [place] [address] [place], [place] [address]   RPR   Collection Time: 01/23/19  5:25 PM  Result Value Ref Range   RPR Ser Ql Non Reactive Non Reactive    Imaging Studies:    [place] Mfm Ob Follow Up  Result Date: 01/23/2019 ----------------------------------------------------------------------  OBSTETRICS REPORT                       (Signed Final 01/23/2019 02:21 pm) ---------------------------------------------------------------------- Patient Info  ID #:       [id number]                          D.O.B.:  Apr 05, 1986 (32 yrs)   Name:       Carrie Duncan                Visit Date: 01/23/2019 12:02 pm ---------------------------------------------------------------------- Performed By  Performed By:     Jeanene Erb BS,      Ref. Address:     [address]                                                             [address]  Attending:        Sander Nephew      [facility]                    MD                                       Fetal [facility]  Referred By:      [facility] ---------------------------------------------------------------------- Orders   #  Description                          Code         Ordered By   1  [place] MFM OB FOLLOW UP                  31517.61     Sander Nephew   2  [place] MFM UA CORD DOPPLER  22633.35     Sander Nephew  ----------------------------------------------------------------------   #  Order #                    Accession #                 Episode #   1  [id number]                  [id number]                  [id number]   2  [id number]                  [id number]                  [id number]  ---------------------------------------------------------------------- Indications   [redacted] weeks gestation of pregnancy                Z3A.26   Hypertension - Chronic/Pre-existing            O10.019   (labetalol and ASA)(Neg Quad)   Poor obstetric history: Previous               O09.299   preeclampsia / eclampsia/gestational HTN   History of sickle cell trait                   Z86.2   Encounter for other antenatal screening        Z36.2   follow-up   Encounter for fetal growth retardation         Z36.4  ---------------------------------------------------------------------- Vital Signs  Weight (lb): 144                               Height:        5'1"  BMI:         27.21 ---------------------------------------------------------------------- Fetal  Evaluation  Num Of Fetuses:         1  Fetal Heart Rate(bpm):  136  Cardiac Activity:       Observed  Presentation:           Breech  Placenta:               Fundal  P. Cord Insertion:      Previously Visualized  Amniotic Fluid  AFI FV:      Subjectively low-normal                              Largest Pocket(cm)                              3.3 ---------------------------------------------------------------------- Biometry  BPD:      60.5  mm     G. Age:  24w 5d          1  %    CI:        69.78   %    70 - 86  FL/HC:      19.0   %    18.6 - 20.4  HC:      231.1  mm     G. Age:  25w 1d        < 3  %    HC/AC:      1.17        1.05 - 1.21  AC:      196.8  mm     G. Age:  24w 3d        < 3  %    FL/BPD:     72.4   %    71 - 87  FL:       43.8  mm     G. Age:  24w 3d        < 3  %    FL/AC:      22.3   %    20 - 24  Est. FW:     698  gm      1 lb 9 oz     15  % ---------------------------------------------------------------------- OB History  Gravidity:    5         Term:   3        Prem:   1        SAB:   0  TOP:          0       Ectopic:  0        Living: 3 ---------------------------------------------------------------------- Gestational Age  LMP:           26w 5d        Date:  07/20/18                 EDD:   04/26/19  U/S Today:     24w 5d                                        EDD:   05/10/19  Best:          26w 5d     Det. By:  LMP  (07/20/18)          EDD:   04/26/19 ---------------------------------------------------------------------- Anatomy  Cranium:               Appears normal         Aortic Arch:            Previously seen  Cavum:                 Previously seen        Ductal Arch:            Previously seen  Ventricles:            Previously seen        Diaphragm:              Previously seen  Choroid Plexus:        Previously seen        Stomach:                Appears normal, left  sided  Cerebellum:            Previously seen        Abdomen:                Appears normal  Posterior Fossa:       Previously seen        Abdominal Wall:         Previously seen  Nuchal Fold:           Previously seen        Cord Vessels:           Previously seen  Face:                  Orbits and profile     Kidneys:                Appear normal                         previously seen  Lips:                  Previously seen        Bladder:                Appears normal  Thoracic:              Appears normal         Spine:                  Previously seen  Heart:                 Appears normal         Upper Extremities:      Previously seen                         (4CH, axis, and                         situs)  RVOT:                  Appears normal         Lower Extremities:      Previously seen  LVOT:                  Appears normal  Other:  Fetus appears to be a female. Heels, 5th digit, and Nasal bone          previously visualized. ---------------------------------------------------------------------- Doppler - Fetal Vessels  Umbilical Artery                                                            ADFV    RDFV                                                              Yes     Yes ---------------------------------------------------------------------- Cervix Uterus Adnexa  Cervix  Not visualized (advanced GA >24wks) ---------------------------------------------------------------------- Comments  I met with Ms.  Perin and reviewed today's examination  demonstrating reversed EDF in the UA dopplers, the DV  dopplers were within normal limits. In addition, we discussed  that the fetus has IUGR. We discussed the recommendation  of delivery at 30 weeks with possible inpatient stay at 28  weeks. Today she will have BMZ administered and follow up  in on Friday. The NST demonstrated minimal variability.  Lastly, we noted that her blood pressure was elevated and  will send her to the MAU.  ---------------------------------------------------------------------- Impression  IUGR  UA Doppler REDF ---------------------------------------------------------------------- Recommendations  BMZ today  To MAU  Follow up UA DOPPLER's on Friday  Admit at 28 weeks. ----------------------------------------------------------------------               Sander Nephew, MD Electronically Signed Final Report   01/23/2019 02:21 pm ----------------------------------------------------------------------  [place] Mfm Ua Cord Doppler  Result Date: 01/23/2019 ----------------------------------------------------------------------  OBSTETRICS REPORT                       (Signed Final 01/23/2019 02:21 pm) ---------------------------------------------------------------------- Patient Info  ID #:       [id number]                          D.O.B.:  [birth year] (32 yrs)  Name:       Carrie Duncan                Visit Date: 01/23/2019 12:02 pm ---------------------------------------------------------------------- Performed By  Performed By:     Jeanene Erb BS,      Ref. Address:     [address]                                                             [address]  Attending:        Sander Nephew      Location[facility]                    MD                                       Fetal Care  Referred By:      [facility] ---------------------------------------------------------------------- Orders   #  Description                          Code         Ordered By   1  [place] MFM OB FOLLOW UP                  33832.91     Sander Nephew   2  [place] MFM UA CORD DOPPLER               [id number]     Sander Nephew  ----------------------------------------------------------------------   #  Order #                    Accession #                 Episode #   1  [id number]                   [id number]                  [id number]   2  [id number]                  [id number]                  [id number]  ---------------------------------------------------------------------- Indications   [redacted] weeks gestation of pregnancy                Z3A.26   Hypertension - Chronic/Pre-existing            O10.019   (labetalol and ASA)(Neg Quad)   Poor obstetric history: Previous               O09.299   preeclampsia / eclampsia/gestational HTN   History of sickle cell trait                   Z86.2   Encounter for other antenatal screening        Z36.2   follow-up   Encounter for fetal growth retardation         Z36.4  ---------------------------------------------------------------------- Vital Signs  Weight (lb): 144                               Height:        5'1"  BMI:         27.21 ---------------------------------------------------------------------- Fetal Evaluation  Num Of Fetuses:         1  Fetal Heart Rate(bpm):  136  Cardiac Activity:       Observed  Presentation:           Breech  Placenta:               Fundal  P. Cord Insertion:      Previously Visualized  Amniotic Fluid  AFI FV:      Subjectively low-normal                              Largest Pocket(cm)                              3.3 ---------------------------------------------------------------------- Biometry  BPD:      60.5  mm     G. Age:  24w 5d          1  %    CI:        69.78   %  70 - 86                                                          FL/HC:      19.0   %    18.6 - 20.4  HC:      231.1  mm     G. Age:  25w 1d        < 3  %    HC/AC:      1.17        1.05 - 1.21  AC:      196.8  mm     G. Age:  24w 3d        < 3  %    FL/BPD:     72.4   %    71 - 87  FL:       43.8  mm     G. Age:  24w 3d        < 3  %    FL/AC:      22.3   %    20 - 24  Est. FW:     698  gm      1 lb 9 oz     15  % ---------------------------------------------------------------------- OB History  Gravidity:    5         Term:   3        Prem:   1        SAB:   0  TOP:           0       Ectopic:  0        Living: 3 ---------------------------------------------------------------------- Gestational Age  LMP:           26w 5d        Date:  07/20/18                 EDD:   04/26/19  U/S Today:     24w 5d                                        EDD:   05/10/19  Best:          26w 5d     Det. By:  LMP  (07/20/18)          EDD:   04/26/19 ---------------------------------------------------------------------- Anatomy  Cranium:               Appears normal         Aortic Arch:            Previously seen  Cavum:                 Previously seen        Ductal Arch:            Previously seen  Ventricles:            Previously seen        Diaphragm:              Previously seen  Choroid Plexus:        Previously seen  Stomach:                Appears normal, left                                                                        sided  Cerebellum:            Previously seen        Abdomen:                Appears normal  Posterior Fossa:       Previously seen        Abdominal Wall:         Previously seen  Nuchal Fold:           Previously seen        Cord Vessels:           Previously seen  Face:                  Orbits and profile     Kidneys:                Appear normal                         previously seen  Lips:                  Previously seen        Bladder:                Appears normal  Thoracic:              Appears normal         Spine:                  Previously seen  Heart:                 Appears normal         Upper Extremities:      Previously seen                         (4CH, axis, and                         situs)  RVOT:                  Appears normal         Lower Extremities:      Previously seen  LVOT:                  Appears normal  Other:  Fetus appears to be a female. Heels, 5th digit, and Nasal bone          previously visualized. ---------------------------------------------------------------------- Doppler - Fetal Vessels  Umbilical Artery  ADFV    RDFV                                                              Yes     Yes ---------------------------------------------------------------------- Cervix Uterus Adnexa  Cervix  Not visualized (advanced GA >24wks) ---------------------------------------------------------------------- Comments  I met with Ms. Lacerte and reviewed today's examination  demonstrating reversed EDF in the UA dopplers, the DV  dopplers were within normal limits. In addition, we discussed  that the fetus has IUGR. We discussed the recommendation  of delivery at 30 weeks with possible inpatient stay at 28  weeks. Today she will have BMZ administered and follow up  in on Friday. The NST demonstrated minimal variability.  Lastly, we noted that her blood pressure was elevated and  will send her to the MAU. ---------------------------------------------------------------------- Impression  IUGR  UA Doppler REDF ---------------------------------------------------------------------- Recommendations  BMZ today  To MAU  Follow up UA DOPPLER's on Friday  Admit at 28 weeks. ----------------------------------------------------------------------               Sander Nephew, MD Electronically Signed Final Report   01/23/2019 02:21 pm ----------------------------------------------------------------------    Medications:  Scheduled . betamethasone acetate-betamethasone sodium phosphate  12 mg Intramuscular Q24 Hr x 2  . docusate sodium  100 mg Oral Daily  . labetalol  300 mg Oral Q8H  . prenatal multivitamin  1 tablet Oral Q1200  . sodium chloride flush  3 mL Intravenous Q12H   I have reviewed the patient's current medications.  ASSESSMENT: G5P3103 48w6dEstimated Date of Delivery: 04/26/19  Patient Active Problem List   Diagnosis Date Noted  . Chronic hypertension affecting pregnancy 01/23/2019  . 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 Language barrier 11/21/2018  . Chronic  hypertension during pregnancy, antepartum 11/07/2018  . Supervision of high risk pregnancy, antepartum 11/07/2018  . Paronychia of right thumb 10/21/2017  . Complicated migraine 152/84/1324 . 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 Seasonal allergies 02/08/2017  . Low back pain 11/22/2016  . Onychomycosis 10/27/2016  . Essential hypertension   . Hx of preeclampsia, prior pregnancy, currently pregnant 03/29/2013  . Sickle cell trait ([place] 11/30/2012    PLAN: - Patient to complete BMZ today - Continue monitoring of BP - continue close fetal monitoring - NICU consult - It was explained to the patient that delivery will likely be via cesarean section - delivery for worsening maternal/fetal status with goal for delivery at 30 weeks per MFM  Haadiya Frogge 01/24/2019,9:41 AM

## 2019-01-25 ENCOUNTER — Other Ambulatory Visit: Payer: Medicaid Other

## 2019-01-25 DIAGNOSIS — Z3A27 27 weeks gestation of pregnancy: Secondary | ICD-10-CM

## 2019-01-25 LAB — COMPREHENSIVE METABOLIC PANEL
ALT: 13 U/L (ref 0–44)
AST: 16 U/L (ref 15–41)
Albumin: 3 g/dL — ABNORMAL LOW (ref 3.5–5.0)
Alkaline Phosphatase: 88 U/L (ref 38–126)
Anion gap: 12 (ref 5–15)
BUN: 6 mg/dL (ref 6–20)
CO2: 22 mmol/L (ref 22–32)
Calcium: 9.1 mg/dL (ref 8.9–10.3)
Chloride: 105 mmol/L (ref 98–111)
Creatinine, Ser: 0.6 mg/dL (ref 0.44–1.00)
GFR calc Af Amer: >60 mL/min (ref >60)
GFR calc non Af Amer: >60 mL/min (ref >60)
Glucose, Bld: 88 mg/dL (ref 70–99)
Potassium: 3.9 mmol/L (ref 3.5–5.1)
Sodium: 139 mmol/L (ref 135–145)
Total Bilirubin: 0.4 mg/dL (ref 0.3–1.2)
Total Protein: 6.2 g/dL — ABNORMAL LOW (ref 6.5–8.1)

## 2019-01-25 LAB — CBC
HCT: 35.9 % — ABNORMAL LOW (ref 36.0–46.0)
Hemoglobin: 12.9 g/dL (ref 12.0–15.0)
MCH: 30.5 pg (ref 26.0–34.0)
MCHC: 35.9 g/dL (ref 30.0–36.0)
MCV: 84.9 fL (ref 80.0–100.0)
Platelets: 237 10*3/uL (ref 150–400)
RBC: 4.23 MIL/uL (ref 3.87–5.11)
RDW: 12.9 % (ref 11.5–15.5)
WBC: 12.7 10*3/uL — ABNORMAL HIGH (ref 4.0–10.5)
nRBC: 0.3 % — ABNORMAL HIGH (ref 0.0–0.2)

## 2019-01-25 LAB — PROTEIN / CREATININE RATIO, URINE
Creatinine, Urine: 21.59 mg/dL
Total Protein, Urine: <6 mg/dL

## 2019-01-25 MED ORDER — PANTOPRAZOLE SODIUM 40 MG PO TBEC
40.0000 mg | DELAYED_RELEASE_TABLET | Freq: Every day | ORAL | Status: DC
  Administered 2019-01-25 – 2019-01-27 (×3): 40 mg via ORAL
  Filled 2019-01-25 (×4): qty 1

## 2019-01-25 MED ORDER — SOD CITRATE-CITRIC ACID 500-334 MG/5ML PO SOLN
ORAL | Status: AC
  Filled 2019-01-25: qty 15

## 2019-01-25 MED ORDER — LABETALOL HCL 200 MG PO TABS
400.0000 mg | ORAL_TABLET | Freq: Three times a day (TID) | ORAL | Status: DC
  Administered 2019-01-25 – 2019-01-27 (×6): 400 mg via ORAL
  Filled 2019-01-25 (×6): qty 2

## 2019-01-25 MED ORDER — LACTATED RINGERS IV BOLUS
1000.0000 mL | Freq: Once | INTRAVENOUS | Status: AC
  Administered 2019-01-25: 1000 mL via INTRAVENOUS

## 2019-01-25 NOTE — Progress Notes (Signed)
Patient complaining of a headache. Patient reports headaches was present yesterday and was relieved with tylenol. She denies visual changes, epigastric or RUQ pain. She reports good fetal movement  Vitals:   01/25/19 1245 01/25/19 1253 01/25/19 1330 01/25/19 1331  BP:  (!) 155/94  (!) 158/94  Pulse:  66  69  Resp:      Temp:      TempSrc:      SpO2: 100%  100%   Weight:      Height:       GENERAL: Well-developed, well-nourished female in no acute distress.  ABDOMEN: Soft, nontender, gravid PELVIC: Not indicated EXTREMITIES: No cyanosis, clubbing, or edema, 2+ distal pulses.  FHT: baseline 150, min-mod variability, occasional prolong decels, no accels Toco: no contractions  Results for orders placed or performed during the hospital encounter of 01/23/19 (from the past 24 hour(s))  Protein / creatinine ratio, urine     Status: None   Collection Time: 01/25/19 12:35 PM  Result Value Ref Range   Creatinine, Urine 21.59 mg/dL   Total Protein, Urine <6 mg/dL   Protein Creatinine Ratio        0.00 - 0.15 mg/mg[Cre]  CBC     Status: Abnormal   Collection Time: 01/25/19 12:44 PM  Result Value Ref Range   WBC 12.7 (H) 4.0 - 10.5 K/uL   RBC 4.23 3.87 - 5.11 MIL/uL   Hemoglobin 12.9 12.0 - 15.0 g/dL   HCT 83.3 (L) 38.3 - 29.1 %   MCV 84.9 80.0 - 100.0 fL   MCH 30.5 26.0 - 34.0 pg   MCHC 35.9 30.0 - 36.0 g/dL   RDW 91.6 60.6 - 00.4 %   Platelets 237 150 - 400 K/uL   nRBC 0.3 (H) 0.0 - 0.2 %  Comprehensive metabolic panel     Status: Abnormal   Collection Time: 01/25/19 12:44 PM  Result Value Ref Range   Sodium 139 135 - 145 mmol/L   Potassium 3.9 3.5 - 5.1 mmol/L   Chloride 105 98 - 111 mmol/L   CO2 22 22 - 32 mmol/L   Glucose, Bld 88 70 - 99 mg/dL   BUN 6 6 - 20 mg/dL   Creatinine, Ser 5.99 0.44 - 1.00 mg/dL   Calcium 9.1 8.9 - 77.4 mg/dL   Total Protein 6.2 (L) 6.5 - 8.1 g/dL   Albumin 3.0 (L) 3.5 - 5.0 g/dL   AST 16 15 - 41 U/L   ALT 13 0 - 44 U/L   Alkaline Phosphatase  88 38 - 126 U/L   Total Bilirubin 0.4 0.3 - 1.2 mg/dL   GFR calc non Af Amer >60 >60 mL/min   GFR calc Af Amer >60 >60 mL/min   Anion gap 12 5 - 15    A/P 33 yo G5P3103 at 27w with CHTN, IUGR and reverse end diastolic flow -  Headache resolved with tylenol again - Continue monitoring BP closely  - Close monitoring of fetal status with plan for delivery with recurrent decels - Continue current care - Patient to have clear liquids for now

## 2019-01-25 NOTE — Progress Notes (Signed)
Called to see patient 3 times overnight. Once when they could not get FHR x 10 min. U/s showed full bladder and then she was back on. Then for some decels and minimal variability, but that picked up. Again now for prolonged decel to 40s x 4 minutes. FHR was back up when I got here. Watched for a bit longer with return to baseline and another probable 1 min. Decel, though tracing was not continuous. May need to consider delivery if this becomes repetitive.

## 2019-01-25 NOTE — Progress Notes (Signed)
Patient ID: Carrie Duncan, female   DOB: [birth year], 33 y.o.   MRN: [id number] [place]) NOTE  Carrie Duncan is a 33 y.o. [id number] at [redacted]w[redacted]d [facility].    Fetal presentation is breech. Length of Stay:  2  Days  Date of admission:01/23/2019  Subjective: Patient reports feeling well, reporting good fetal movement without contractions, vaginal bleeding or leakage of fluid Patient reports the fetal movement as active. Patient reports uterine contraction  activity as none. Patient reports  vaginal bleeding as none. Patient describes fluid per vagina as None.  Vitals:  Blood pressure 138/89, pulse 81, temperature 98.7 F (37.1 C), temperature source Oral, resp. rate 16, height 5' 2" (1.575 m), weight 67.5 kg, last menstrual period 07/20/2018, SpO2 99 %, currently breastfeeding. Vitals:   01/24/19 2129 01/25/19 0004 01/25/19 0319 01/25/19 0815  BP: (!) 152/96 (!) 146/90 140/87 138/89  Pulse: 84 79 77 81  Resp:  _0 Temp:  99 F (37.2 C) 98.2 F (36.8 C) 98.7 F (37.1 C)  TempSrc:  Oral  Oral  SpO2:  98% 98% 99%  Weight:      Height:       Physical Examination: GENERAL: Well-developed, well-nourished female in no acute distress.  LUNGS: Clear to auscultation bilaterally.  HEART: Regular rate and rhythm. ABDOMEN: Soft, nontender,gravid PELVIC: Not indicated EXTREMITIES: No cyanosis, clubbing, or edema, 2+ distal pulses.   Fetal Monitoring:  Baseline 150, min variability, no accels, recurrent decels in the early hours of the morning   equivocal  Labs:  No results found for this or any previous visit (from the past 24 hour(s)).  Imaging Studies:    [place]fm Ob Follow Up  Result Date: 01/23/2019 ----------------------------------------------------------------------  OBSTETRICS REPORT                       (Signed Final 01/23/2019 02:21 pm)  ---------------------------------------------------------------------- Patient Info  ID #:       [id number]                         D.O.B.:  006/17/1987(33 yrs)  Name:       CErnst Duncan               Visit Date: 01/23/2019 12:02 pm ---------------------------------------------------------------------- Performed By  Performed By:     KJeanene ErbBS,      Ref. Address[address]     [address]                                                            [address]  Attending:        CSander Nephew     [facility]                    MD  Fetal Care  Referred By:      [facility] ---------------------------------------------------------------------- Orders   #  Description                          Code         Ordered By   1  US MFM OB FOLLOW UP                  76816.01     CORENTHIAN                                                        BOOKER   2  US MFM UA CORD DOPPLER               76820.02     CORENTHIAN                                                        BOOKER  ----------------------------------------------------------------------   #  Order #                    Accession #                 Episode #   1  [id number]                  [id number]                  [id number]   2  [id number]                  [id number]                  [id number]  ---------------------------------------------------------------------- Indications   [redacted] weeks gestation of pregnancy                Z3A.26   Hypertension - Chronic/Pre-existing            O10.019   (labetalol and ASA)(Neg Quad)   Poor obstetric history: Previous               O09.299   preeclampsia / eclampsia/gestational HTN   History of sickle cell trait                   Z86.2   Encounter for other antenatal screening        Z36.2   follow-up   Encounter for fetal growth retardation         Z36.4  ---------------------------------------------------------------------- Vital Signs  Weight  (lb): 144                               Height:        5'1"  BMI:         27.21 ---------------------------------------------------------------------- Fetal Evaluation  Num Of Fetuses:         1  Fetal Heart Rate(bpm):  136  Cardiac Activity:       Observed  Presentation:           Breech  Placenta:                 Fundal  P. Cord Insertion:      Previously Visualized  Amniotic Fluid  AFI FV:      Subjectively low-normal                              Largest Pocket(cm)                              3.3 ---------------------------------------------------------------------- Biometry  BPD:      60.5  mm     G. Age:  24w 5d          1  %    CI:        69.78   %    70 - 86                                                          FL/HC:      19.0   %    18.6 - 20.4  HC:      231.1  mm     G. Age:  25w 1d        < 3  %    HC/AC:      1.17        1.05 - 1.21  AC:      196.8  mm     G. Age:  24w 3d        < 3  %    FL/BPD:     72.4   %    71 - 87  FL:       43.8  mm     G. Age:  24w 3d        < 3  %    FL/AC:      22.3   %    20 - 24  Est. FW:     698  gm      1 lb 9 oz     15  % ---------------------------------------------------------------------- OB History  Gravidity:    5         Term:   3        Prem:   1        SAB:   0  TOP:          0       Ectopic:  0        Living: 3 ---------------------------------------------------------------------- Gestational Age  LMP:           26w 5d        Date:  07/20/18                 EDD:   04/26/19  U/S Today:     24w 5d                                        EDD:   05/10/19  Best:          26w 5d     Det. By:  LMP  (07/20/18)          EDD:   04/26/19 ---------------------------------------------------------------------- Anatomy  Cranium:                 Appears normal         Aortic Arch:            Previously seen  Cavum:                 Previously seen        Ductal Arch:            Previously seen  Ventricles:            Previously seen        Diaphragm:              Previously seen   Choroid Plexus:        Previously seen        Stomach:                Appears normal, left                                                                        sided  Cerebellum:            Previously seen        Abdomen:                Appears normal  Posterior Fossa:       Previously seen        Abdominal Wall:         Previously seen  Nuchal Fold:           Previously seen        Cord Vessels:           Previously seen  Face:                  Orbits and profile     Kidneys:                Appear normal                         previously seen  Lips:                  Previously seen        Bladder:                Appears normal  Thoracic:              Appears normal         Spine:                  Previously seen  Heart:                 Appears normal         Upper Extremities:      Previously seen                         (4CH, axis, and                         situs)  RVOT:                  Appears normal  Lower Extremities:      Previously seen  LVOT:                  Appears normal  Other:  Fetus appears to be a female. Heels, 5th digit, and Nasal bone          previously visualized. ---------------------------------------------------------------------- Doppler - Fetal Vessels  Umbilical Artery                                                            ADFV    RDFV                                                              Yes     Yes ---------------------------------------------------------------------- Cervix Uterus Adnexa  Cervix  Not visualized (advanced GA >24wks) ---------------------------------------------------------------------- Comments  I met with Ms. Rathke and reviewed today's examination  demonstrating reversed EDF in the UA dopplers, the DV  dopplers were within normal limits. In addition, we discussed  that the fetus has IUGR. We discussed the recommendation  of delivery at 30 weeks with possible inpatient stay at 28  weeks. Today she will have BMZ administered and follow up  in on  Friday. The NST demonstrated minimal variability.  Lastly, we noted that her blood pressure was elevated and  will send her to the MAU. ---------------------------------------------------------------------- Impression  IUGR  UA Doppler REDF ---------------------------------------------------------------------- Recommendations  BMZ today  To MAU  Follow up UA DOPPLER's on Friday  Admit at 28 weeks. ----------------------------------------------------------------------               Sander Nephew, MD Electronically Signed Final Report   01/23/2019 02:21 pm ----------------------------------------------------------------------  [place] Mfm Ua Cord Doppler  Result Date: 01/23/2019 ----------------------------------------------------------------------  OBSTETRICS REPORT                       (Signed Final 01/23/2019 02:21 pm) ---------------------------------------------------------------------- Patient Info  ID #:       [id number]                          D.O.B.:  October 05, 1985 (32 yrs)  Name:       Carrie Duncan                Visit Date: 01/23/2019 12:02 pm ---------------------------------------------------------------------- Performed By  Performed By:     Jeanene Erb BS,      Ref. Address:     [address]                                                             [address]  Attending:        Sander Nephew      [facility]                    MD                                       Fetal Care  Referred By:      [facility] ---------------------------------------------------------------------- Orders   #  Description                          Code         Ordered By   1  [place] MFM OB FOLLOW UP                  70017.49     Sander Nephew   2  [place] MFM UA CORD DOPPLER               76820.02     Sander Nephew   ----------------------------------------------------------------------   #  Order #                    Accession #                 Episode #   1  [id number]                  [id number]                  [id number]   2  [id number]                  [id number]                  [id number]  ---------------------------------------------------------------------- Indications   [redacted] weeks gestation of pregnancy                Z3A.26   Hypertension - Chronic/Pre-existing            O10.019   (labetalol and ASA)(Neg Quad)   Poor obstetric history: Previous               O09.299   preeclampsia / eclampsia/gestational HTN   History of sickle cell trait                   Z86.2   Encounter for other antenatal screening        Z36.2   follow-up   Encounter for fetal growth retardation         Z36.4  ---------------------------------------------------------------------- Vital Signs  Weight (lb): 144                               Height:  5'1"  BMI:         27.21 ---------------------------------------------------------------------- Fetal Evaluation  Num Of Fetuses:         1  Fetal Heart Rate(bpm):  136  Cardiac Activity:       Observed  Presentation:           Breech  Placenta:               Fundal  P. Cord Insertion:      Previously Visualized  Amniotic Fluid  AFI FV:      Subjectively low-normal                              Largest Pocket(cm)                              3.3 ---------------------------------------------------------------------- Biometry  BPD:      60.5  mm     G. Age:  24w 5d          1  %    CI:        69.78   %    70 - 86                                                          FL/HC:      19.0   %    18.6 - 20.4  HC:      231.1  mm     G. Age:  25w 1d        < 3  %    HC/AC:      1.17        1.05 - 1.21  AC:      196.8  mm     G. Age:  24w 3d        < 3  %    FL/BPD:     72.4   %    71 - 87  FL:       43.8  mm     G. Age:  24w 3d        < 3  %    FL/AC:      22.3   %    20 - 24  Est. FW:     698  gm      1 lb 9  oz     15  % ---------------------------------------------------------------------- OB History  Gravidity:    5         Term:   3        Prem:   1        SAB:   0  TOP:          0       Ectopic:  0        Living: 3 ---------------------------------------------------------------------- Gestational Age  LMP:           26w 5d        Date:  07/20/18                 EDD:   04/26/19  U/S Today:     24w 5d  EDD:   05/10/19  Best:          26w 5d     Det. By:  LMP  (07/20/18)          EDD:   04/26/19 ---------------------------------------------------------------------- Anatomy  Cranium:               Appears normal         Aortic Arch:            Previously seen  Cavum:                 Previously seen        Ductal Arch:            Previously seen  Ventricles:            Previously seen        Diaphragm:              Previously seen  Choroid Plexus:        Previously seen        Stomach:                Appears normal, left                                                                        sided  Cerebellum:            Previously seen        Abdomen:                Appears normal  Posterior Fossa:       Previously seen        Abdominal Wall:         Previously seen  Nuchal Fold:           Previously seen        Cord Vessels:           Previously seen  Face:                  Orbits and profile     Kidneys:                Appear normal                         previously seen  Lips:                  Previously seen        Bladder:                Appears normal  Thoracic:              Appears normal         Spine:                  Previously seen  Heart:                 Appears normal         Upper Extremities:      Previously seen                         (  4CH, axis, and                         situs)  RVOT:                  Appears normal         Lower Extremities:      Previously seen  LVOT:                  Appears normal  Other:  Fetus appears to be a female. Heels, 5th digit, and  Nasal bone          previously visualized. ---------------------------------------------------------------------- Doppler - Fetal Vessels  Umbilical Artery                                                            ADFV    RDFV                                                              Yes     Yes ---------------------------------------------------------------------- Cervix Uterus Adnexa  Cervix  Not visualized (advanced GA >24wks) ---------------------------------------------------------------------- Comments  I met with Ms. Prill and reviewed today's examination  demonstrating reversed EDF in the UA dopplers, the DV  dopplers were within normal limits. In addition, we discussed  that the fetus has IUGR. We discussed the recommendation  of delivery at 30 weeks with possible inpatient stay at 28  weeks. Today she will have BMZ administered and follow up  in on Friday. The NST demonstrated minimal variability.  Lastly, we noted that her blood pressure was elevated and  will send her to the MAU. ---------------------------------------------------------------------- Impression  IUGR  UA Doppler REDF ---------------------------------------------------------------------- Recommendations  BMZ today  To MAU  Follow up UA DOPPLER's on Friday  Admit at 28 weeks. ----------------------------------------------------------------------               Sander Nephew, MD Electronically Signed Final Report   01/23/2019 02:21 pm ----------------------------------------------------------------------    Medications:  Scheduled . sodium citrate-citric acid      . betamethasone acetate-betamethasone sodium phosphate  12 mg Intramuscular Q24 Hr x 2  . docusate sodium  100 mg Oral Daily  . labetalol  300 mg Oral Q8H  . prenatal multivitamin  1 tablet Oral Q1200  . sodium chloride flush  3 mL Intravenous Q12H   I have reviewed the patient's current medications.  ASSESSMENT: [id number] Date of Delivery:  04/26/19  Patient Active Problem List   Diagnosis Date Noted  . Chronic hypertension affecting pregnancy 01/23/2019  . 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 Language barrier 11/21/2018  . Chronic hypertension during pregnancy, antepartum 11/07/2018  . Supervision of high risk pregnancy, antepartum 11/07/2018  . Paronychia of right thumb 10/21/2017  . Complicated migraine 114/78/2956 . 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 Seasonal allergies 02/08/2017  . Low back pain 11/22/2016  . Onychomycosis 10/27/2016  . Essential hypertension   . Hx of preeclampsia, prior pregnancy, currently pregnant 03/29/2013  . Sickle cell trait ([place]elphos 11/30/2012  PLAN: - Patient to complete BMZ yesterday - Will increase labetalol to 400 mg TID - continue close fetal monitoring - NICU consult pending - Patient placed NPO  - Patient understands that she will have a cesarean section  - delivery for worsening maternal/fetal status with goal for delivery at 30 weeks per MFM  Wrenn Willcox 01/25/2019,9:14 AM

## 2019-01-25 NOTE — Progress Notes (Signed)
Dr. Shawnie Pons called down to help find babies HR @3 :36am. Bedside ultrasound performed. Dr. Shawnie Pons reviewed strip @3 :44am and wants to continue to monitor.

## 2019-01-26 ENCOUNTER — Inpatient Hospital Stay (HOSPITAL_COMMUNITY): Payer: Medicaid Other

## 2019-01-26 ENCOUNTER — Ambulatory Visit (HOSPITAL_COMMUNITY): Payer: Medicaid Other

## 2019-01-26 ENCOUNTER — Encounter (HOSPITAL_COMMUNITY): Payer: Self-pay

## 2019-01-26 ENCOUNTER — Encounter (HOSPITAL_COMMUNITY): Payer: Self-pay | Admitting: Obstetrics and Gynecology

## 2019-01-26 DIAGNOSIS — O09292 Supervision of pregnancy with other poor reproductive or obstetric history, second trimester: Secondary | ICD-10-CM

## 2019-01-26 DIAGNOSIS — Z862 Personal history of diseases of the blood and blood-forming organs and certain disorders involving the immune mechanism: Secondary | ICD-10-CM

## 2019-01-26 DIAGNOSIS — O10012 Pre-existing essential hypertension complicating pregnancy, second trimester: Secondary | ICD-10-CM

## 2019-01-26 DIAGNOSIS — Z3A27 27 weeks gestation of pregnancy: Secondary | ICD-10-CM

## 2019-01-26 DIAGNOSIS — O365920 Maternal care for other known or suspected poor fetal growth, second trimester, not applicable or unspecified: Secondary | ICD-10-CM

## 2019-01-26 LAB — TYPE AND SCREEN
ABO/RH(D): O POS
Antibody Screen: NEGATIVE

## 2019-01-26 MED ORDER — POLYETHYLENE GLYCOL 3350 17 G PO PACK
17.0000 g | PACK | Freq: Two times a day (BID) | ORAL | Status: DC
  Administered 2019-01-27 (×2): 17 g via ORAL
  Filled 2019-01-26 (×3): qty 1

## 2019-01-26 NOTE — Progress Notes (Signed)
Patient ID: Carrie Duncan, female   DOB: [birth year], 33 y.o.   MRN: [id number] [place]) NOTE  Carrie Duncan is a 33 y.o. [id number] at [redacted]w[redacted]d who is [facility] and abnormal dopplers.    Fetal presentation is breech. Length of Stay:  3  Days  Date of admission:01/23/2019  Subjective: Patient reports feeling well, reporting good fetal movement without contractions, vaginal bleeding or leakage of fluid. She denies any HA, visual changes or abdominal pain Patient reports the fetal movement as active. Patient reports uterine contraction  activity as none. Patient reports  vaginal bleeding as none. Patient describes fluid per vagina as None.  Vitals:  Blood pressure 140/86, pulse 75, temperature 98.3 F (36.8 C), temperature source Oral, resp. rate 18, height '5\' 2"'$  (1.575 m), weight 67.5 kg, last menstrual period 07/20/2018, SpO2 97 %, currently breastfeeding. Vitals:   01/25/19 2245 01/25/19 2249 01/26/19 0357 01/26/19 0800  BP:  (!) 154/101 (!) 144/82 140/86  Pulse:  82 71 75  Resp:   18 18  Temp:   98.6 F (37 C) 98.3 F (36.8 C)  TempSrc:   Oral Oral  SpO2: 99% 97% 98% 97%  Weight:      Height:       Physical Examination: GENERAL: Well-developed, well-nourished female in no acute distress.  LUNGS: Clear to auscultation bilaterally.  HEART: Regular rate and rhythm. ABDOMEN: Soft, nontender,gravid PELVIC: Not indicated EXTREMITIES: No cyanosis, clubbing, or edema, 2+ distal pulses.   Fetal Monitoring:  Baseline 155, min-mod variability, no accels, hourly prolonged decels, no accels   equivocal  Labs:  Results for orders placed or performed during the hospital encounter of 01/23/19 (from the past 24 hour(s))  Protein / creatinine ratio, urine   Collection Time: 01/25/19 12:35 PM  Result Value Ref Range   Creatinine, Urine 21.59 mg/dL   Total Protein, Urine <6 mg/dL   Protein Creatinine Ratio        0.00 - 0.15 mg/mg[Cre]  CBC    Collection Time: 01/25/19 12:44 PM  Result Value Ref Range   WBC 12.7 (H) 4.0 - 10.5 K/uL   RBC 4.23 3.87 - 5.11 MIL/uL   Hemoglobin 12.9 12.0 - 15.0 g/dL   HCT 35.9 (L) 36.0 - 46.0 %   MCV 84.9 80.0 - 100.0 fL   MCH 30.5 26.0 - 34.0 pg   MCHC 35.9 30.0 - 36.0 g/dL   RDW 12.9 11.5 - 15.5 %   Platelets 237 150 - 400 K/uL   nRBC 0.3 (H) 0.0 - 0.2 %  Comprehensive metabolic panel   Collection Time: 01/25/19 12:44 PM  Result Value Ref Range   Sodium 139 135 - 145 mmol/L   Potassium 3.9 3.5 - 5.1 mmol/L   Chloride 105 98 - 111 mmol/L   CO2 22 22 - 32 mmol/L   Glucose, Bld 88 70 - 99 mg/dL   BUN 6 6 - 20 mg/dL   Creatinine, Ser 0.60 0.44 - 1.00 mg/dL   Calcium 9.1 8.9 - 10.3 mg/dL   Total Protein 6.2 (L) 6.5 - 8.1 g/dL   Albumin 3.0 (L) 3.5 - 5.0 g/dL   AST 16 15 - 41 U/L   ALT 13 0 - 44 U/L   Alkaline Phosphatase 88 38 - 126 U/L   Total Bilirubin 0.4 0.3 - 1.2 mg/dL   GFR calc non Af Amer >60 >60 mL/min   GFR calc Af Amer >60 >60 mL/min   Anion gap 12 5 -  15    Imaging Studies:    [place] Mfm Ob Follow Up  Result Date: 01/23/2019 ----------------------------------------------------------------------  OBSTETRICS REPORT                       (Signed Final 01/23/2019 02:21 pm) ---------------------------------------------------------------------- Patient Info  ID #:       [id number]                          D.O.B.:  [birth year] (32 yrs)  Name:       Carrie Duncan                Visit Date: 01/23/2019 12:02 pm ---------------------------------------------------------------------- Performed By  Performed By:     Jeanene Erb BS,      Ref. Address:     [address]                                                             [address]  Attending:        Sander Nephew      [facility]                    MD                                       [facility]  Referred By:      [facility]  ---------------------------------------------------------------------- Orders   #  Description                          Code         Ordered By   1  [place] MFM OB FOLLOW UP                  40981.19     Sander Nephew   2  [place] MFM UA CORD DOPPLER               14782.95     Sander Nephew  ----------------------------------------------------------------------   #  Order #                    Accession #                 Episode #   1  [id number]  [id number]   2  [id number]                  [id number]                  [id number]  ---------------------------------------------------------------------- Indications   [redacted] weeks gestation of pregnancy                Z3A.26   Hypertension - Chronic/Pre-existing            O10.019   (labetalol and ASA)(Neg Quad)   Poor obstetric history: Previous               O09.299   preeclampsia / eclampsia/gestational HTN   History of sickle cell trait                   Z86.2   Encounter for other antenatal screening        Z36.2   follow-up   Encounter for fetal growth retardation         Z36.4  ---------------------------------------------------------------------- Vital Signs  Weight (lb): 144                               Height:        5'1"  BMI:         27.21 ---------------------------------------------------------------------- Fetal Evaluation  Num Of Fetuses:         1  Fetal Heart Rate(bpm):  136  Cardiac Activity:       Observed  Presentation:           Breech  Placenta:               Fundal  P. Cord Insertion:      Previously Visualized  Amniotic Fluid  AFI FV:      Subjectively low-normal                              Largest Pocket(cm)                              3.3 ---------------------------------------------------------------------- Biometry  BPD:      60.5  mm     G. Age:  24w 5d          1  %    CI:        69.78   %    70 -  86                                                          FL/HC:      19.0   %    18.6 - 20.4  HC:      231.1  mm     G. Age:  25w 1d        < 3  %    HC/AC:      1.17        1.05 - 1.21  AC:      196.8  mm     G. Age:  24w 3d        <  3  %    FL/BPD:     72.4   %    71 - 87  FL:       43.8  mm     G. Age:  24w 3d        < 3  %    FL/AC:      22.3   %    20 - 24  Est. FW:     698  gm      1 lb 9 oz     15  % ---------------------------------------------------------------------- OB History  Gravidity:    5         Term:   3        Prem:   1        SAB:   0  TOP:          0       Ectopic:  0        Living: 3 ---------------------------------------------------------------------- Gestational Age  LMP:           26w 5d        Date:  07/20/18                 EDD:   04/26/19  U/S Today:     24w 5d                                        EDD:   05/10/19  Best:          26w 5d     Det. By:  LMP  (07/20/18)          EDD:   04/26/19 ---------------------------------------------------------------------- Anatomy  Cranium:               Appears normal         Aortic Arch:            Previously seen  Cavum:                 Previously seen        Ductal Arch:            Previously seen  Ventricles:            Previously seen        Diaphragm:              Previously seen  Choroid Plexus:        Previously seen        Stomach:                Appears normal, left                                                                        sided  Cerebellum:            Previously seen        Abdomen:                Appears normal  Posterior Fossa:       Previously seen        Abdominal Wall:  Previously seen  Nuchal Fold:           Previously seen        Cord Vessels:           Previously seen  Face:                  Orbits and profile     Kidneys:                Appear normal                         previously seen  Lips:                  Previously seen        Bladder:                Appears normal  Thoracic:              Appears  normal         Spine:                  Previously seen  Heart:                 Appears normal         Upper Extremities:      Previously seen                         (4CH, axis, and                         situs)  RVOT:                  Appears normal         Lower Extremities:      Previously seen  LVOT:                  Appears normal  Other:  Fetus appears to be a female. Heels, 5th digit, and Nasal bone          previously visualized. ---------------------------------------------------------------------- Doppler - Fetal Vessels  Umbilical Artery                                                            ADFV    RDFV                                                              Yes     Yes ---------------------------------------------------------------------- Cervix Uterus Adnexa  Cervix  Not visualized (advanced GA >24wks) ---------------------------------------------------------------------- Comments  I met with Ms. Marquard and reviewed today's examination  demonstrating reversed EDF in the UA dopplers, the DV  dopplers were within normal limits. In addition, we discussed  that the fetus has IUGR. We discussed the recommendation  of delivery at 30 weeks with possible inpatient stay at 28  weeks. Today she will have BMZ administered and follow up  in on Friday. The NST demonstrated minimal variability.  Lastly, we noted that her blood pressure was elevated and  will send her to the MAU. ---------------------------------------------------------------------- Impression  IUGR  UA Doppler REDF ---------------------------------------------------------------------- Recommendations  BMZ today  To MAU  Follow up UA DOPPLER's on Friday  Admit at 28 weeks. ----------------------------------------------------------------------               Sander Nephew, MD Electronically Signed Final Report   01/23/2019 02:21 pm ----------------------------------------------------------------------  [place] Mfm Ua Cord Doppler  Result  Date: 01/23/2019 ----------------------------------------------------------------------  OBSTETRICS REPORT                       (Signed Final 01/23/2019 02:21 pm) ---------------------------------------------------------------------- Patient Info  ID #:       [id number]                          D.O.B.:  1986-02-24 (32 yrs)  Name:       Carrie Duncan                Visit Date: 01/23/2019 12:02 pm ---------------------------------------------------------------------- Performed By  Performed By:     Jeanene Erb BS,      Ref. Address:     [address]                                                             [address]  Attending:        Sander Nephew      [facility]                    MD                                       [facility]etal Care  Referred By:      [facility] ---------------------------------------------------------------------- Orders   #  Description                          Code         Ordered By   1  [place] MFM OB FOLLOW UP                  22449.75     CORENTHIAN                                                        BOOKER   2  [place] MFM UA CORD DOPPLER               76820.02     CORENTHIAN  BOOKER  ----------------------------------------------------------------------   #  Order #                    Accession #                 Episode #   1  [id number]                  [id number]                  [id number]   2  [id number]                  [id number]                  [id number]  ---------------------------------------------------------------------- Indications   [redacted] weeks gestation of pregnancy                Z3A.26   Hypertension - Chronic/Pre-existing            O10.019   (labetalol and ASA)(Neg Quad)   Poor obstetric history: Previous               O09.299   preeclampsia / eclampsia/gestational HTN   History of sickle cell trait                   Z86.2   Encounter for other antenatal screening         Z36.2   follow-up   Encounter for fetal growth retardation         Z36.4  ---------------------------------------------------------------------- Vital Signs  Weight (lb): 144                               Height:        5'1"  BMI:         27.21 ---------------------------------------------------------------------- Fetal Evaluation  Num Of Fetuses:         1  Fetal Heart Rate(bpm):  136  Cardiac Activity:       Observed  Presentation:           Breech  Placenta:               Fundal  P. Cord Insertion:      Previously Visualized  Amniotic Fluid  AFI FV:      Subjectively low-normal                              Largest Pocket(cm)                              3.3 ---------------------------------------------------------------------- Biometry  BPD:      60.5  mm     G. Age:  24w 5d          1  %    CI:        69.78   %    70 - 86                                                          FL/HC:      19.0   %    18.6 - 20.4  HC:      231.1  mm     G. Age:  25w 1d        < 3  %    HC/AC:      1.17        1.05 - 1.21  AC:      196.8  mm     G. Age:  24w 3d        < 3  %    FL/BPD:     72.4   %    71 - 87  FL:       43.8  mm     G. Age:  24w 3d        < 3  %    FL/AC:      22.3   %    20 - 24  Est. FW:     698  gm      1 lb 9 oz     15  % ---------------------------------------------------------------------- OB History  Gravidity:    5         Term:   3        Prem:   1        SAB:   0  TOP:          0       Ectopic:  0        Living: 3 ---------------------------------------------------------------------- Gestational Age  LMP:           26w 5d        Date:  07/20/18                 EDD:   04/26/19  U/S Today:     24w 5d                                        EDD:   05/10/19  Best:          26w 5d     Det. By:  LMP  (07/20/18)          EDD:   04/26/19 ---------------------------------------------------------------------- Anatomy  Cranium:               Appears normal         Aortic Arch:            Previously seen  Cavum:                  Previously seen        Ductal Arch:            Previously seen  Ventricles:            Previously seen        Diaphragm:              Previously seen  Choroid Plexus:        Previously seen        Stomach:                Appears normal, left  sided  Cerebellum:            Previously seen        Abdomen:                Appears normal  Posterior Fossa:       Previously seen        Abdominal Wall:         Previously seen  Nuchal Fold:           Previously seen        Cord Vessels:           Previously seen  Face:                  Orbits and profile     Kidneys:                Appear normal                         previously seen  Lips:                  Previously seen        Bladder:                Appears normal  Thoracic:              Appears normal         Spine:                  Previously seen  Heart:                 Appears normal         Upper Extremities:      Previously seen                         (4CH, axis, and                         situs)  RVOT:                  Appears normal         Lower Extremities:      Previously seen  LVOT:                  Appears normal  Other:  Fetus appears to be a female. Heels, 5th digit, and Nasal bone          previously visualized. ---------------------------------------------------------------------- Doppler - Fetal Vessels  Umbilical Artery                                                            ADFV    RDFV                                                              Yes     Yes ---------------------------------------------------------------------- Cervix Uterus Adnexa  Cervix  Not visualized (advanced GA >24wks) ---------------------------------------------------------------------- Comments  I met with Ms.  Saltsman and reviewed today's examination  demonstrating reversed EDF in the UA dopplers, the DV  dopplers were within normal limits. In addition, we discussed  that the fetus has  IUGR. We discussed the recommendation  of delivery at 30 weeks with possible inpatient stay at 28  weeks. Today she will have BMZ administered and follow up  in on Friday. The NST demonstrated minimal variability.  Lastly, we noted that her blood pressure was elevated and  will send her to the MAU. ---------------------------------------------------------------------- Impression  IUGR  UA Doppler REDF ---------------------------------------------------------------------- Recommendations  BMZ today  To MAU  Follow up UA DOPPLER's on Friday  Admit at 28 weeks. ----------------------------------------------------------------------               Sander Nephew, MD Electronically Signed Final Report   01/23/2019 02:21 pm ----------------------------------------------------------------------    Medications:  Scheduled . docusate sodium  100 mg Oral Daily  . labetalol  400 mg Oral Q8H  . pantoprazole  40 mg Oral Daily  . prenatal multivitamin  1 tablet Oral Q1200  . sodium chloride flush  3 mL Intravenous Q12H   I have reviewed the patient's current medications.  ASSESSMENT: [id number] 28w1dEstimated Date of Delivery: 04/26/19  Patient Active Problem List   Diagnosis Date Noted  . Chronic hypertension affecting pregnancy 01/23/2019  . 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 Language barrier 11/21/2018  . Chronic hypertension during pregnancy, antepartum 11/07/2018  . Supervision of high risk pregnancy, antepartum 11/07/2018  . Paronychia of right thumb 10/21/2017  . Complicated migraine 181/38/8719 . 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 Seasonal allergies 02/08/2017  . Low back pain 11/22/2016  . Onychomycosis 10/27/2016  . Essential hypertension   . Hx of preeclampsia, prior pregnancy, currently pregnant 03/29/2013  . Sickle cell trait ([place]ima 11/30/2012    PLAN: - Patient to completed BMZ - HTN stable on labetalol to 400 mg TID - continue close fetal monitoring - NICU consult  pending - Follow up fetal dopplers today - Patient understands that she will have a cesarean section  - delivery for worsening maternal/fetal status with goal for delivery at 30 weeks per MFM  Nikhil Osei 01/26/2019,9:53 AM

## 2019-01-27 LAB — CBC
HCT: 34 % — ABNORMAL LOW (ref 36.0–46.0)
Hemoglobin: 11.9 g/dL — ABNORMAL LOW (ref 12.0–15.0)
MCH: 29.7 pg (ref 26.0–34.0)
MCHC: 35 g/dL (ref 30.0–36.0)
MCV: 84.8 fL (ref 80.0–100.0)
Platelets: 205 10*3/uL (ref 150–400)
RBC: 4.01 MIL/uL (ref 3.87–5.11)
RDW: 12.7 % (ref 11.5–15.5)
WBC: 8 10*3/uL (ref 4.0–10.5)
nRBC: 0.3 % — ABNORMAL HIGH (ref 0.0–0.2)

## 2019-01-27 LAB — COMPREHENSIVE METABOLIC PANEL
ALT: 16 U/L (ref 0–44)
AST: 22 U/L (ref 15–41)
Albumin: 2.8 g/dL — ABNORMAL LOW (ref 3.5–5.0)
Alkaline Phosphatase: 88 U/L (ref 38–126)
Anion gap: 9 (ref 5–15)
BUN: 6 mg/dL (ref 6–20)
CO2: 21 mmol/L — ABNORMAL LOW (ref 22–32)
Calcium: 8.6 mg/dL — ABNORMAL LOW (ref 8.9–10.3)
Chloride: 107 mmol/L (ref 98–111)
Creatinine, Ser: 0.58 mg/dL (ref 0.44–1.00)
GFR calc Af Amer: >60 mL/min (ref >60)
GFR calc non Af Amer: >60 mL/min (ref >60)
Glucose, Bld: 83 mg/dL (ref 70–99)
Potassium: 3.5 mmol/L (ref 3.5–5.1)
Sodium: 137 mmol/L (ref 135–145)
Total Bilirubin: 0.4 mg/dL (ref 0.3–1.2)
Total Protein: 5.5 g/dL — ABNORMAL LOW (ref 6.5–8.1)

## 2019-01-27 LAB — PROTEIN / CREATININE RATIO, URINE
Creatinine, Urine: 76.18 mg/dL
Protein Creatinine Ratio: 0.12 mg/mg{Cre} (ref 0.00–0.15)
Total Protein, Urine: 9 mg/dL

## 2019-01-27 MED ORDER — LABETALOL HCL 200 MG PO TABS
600.0000 mg | ORAL_TABLET | Freq: Three times a day (TID) | ORAL | Status: DC
  Administered 2019-01-27 – 2019-01-28 (×3): 600 mg via ORAL
  Filled 2019-01-27 (×3): qty 3

## 2019-01-27 NOTE — Progress Notes (Signed)
Requested Dr Jolayne Panther to review patients fetal monitor strip due to decels one lasting 6 minutes with minimal variability.No new order given after she reviewed the strip.

## 2019-01-27 NOTE — Consult Note (Signed)
Neonatology Consult to Antenatal Patient: 01/27/2019 2:47 PM    I was requested by Dr Vergie Living to see this patient in order to provide antenatal counseling due to severe pre-eclampsia at 27 2/[redacted] weeks gestation.    Ms. Carrie Duncan is a  33 y/o G5P3 who was admitted on 4/28 and is now 27 2/[redacted] weeks GA.  She was admitted for worsening chronic HTN, IUGR and abnormal dopplers.  She is currently not in labor and goal for delivery at 30 weeks per MFM recommendation.  EFW is 698 grams.  She received BMZ on 4/28 and 4/29 and remains on Labetalol.  I spoke with Ms. Ament in Room 105.  I asked her if we will need an interpreter but she said she speaks [place] and has been in the [place] for the past 7 years (native language is swahili).  I discussed in detail what to expect in case of possible delivery of the infant in the next few days including morbidity and mortality at this gestational age, usual delivery room resuscitation including intubation and surfactant administration in the DR.  Discussed possible respiratory complications and need for support including mechanical ventilation, IV access, sepsis work-up, NG/OG feedings ( benefits of BF and availability of DBM), risk for IVH with the potential for motor/cognitive deficits, length of stay and long-term outcome.  She was attentive, had appropriate questions, and expressed appreciation for my input.    Thank you for asking [place] to see this patient and allowing [place] to participate in her care.  Please call if there are any further questions.   Overton Mam, MD (Attending Neonatologist)   Total length of face-to-face or floor/unit time for this encounter was 40 minutes. Counseling and/or coordination of care was greater than fifty percent of the time above.

## 2019-01-27 NOTE — Progress Notes (Signed)
Patient ID: Mikayela Duncan, female   DOB: 1986-05-22, 33 y.o.   MRN: [id number] [place]) NOTE  Carrie Duncan is a 33 y.o. [id number] at 80w2dwho is admitted for worsening CHTN, IUGR and abnormal dopplers.    Fetal presentation is breech. Length of Stay:  4  Days  Date of admission:01/23/2019  Subjective: Shedenies any HA, visual changes or abdominal pain Patient reports the fetal movement as active and no decreased FM Patient reports uterine contraction  activity as none. Patient reports  vaginal bleeding as none. Patient describes fluid per vagina as None.  Vitals:  Blood pressure (!) 155/106, pulse 71, temperature 98.6 F (37 C), temperature source Oral, resp. rate 17, height '5\' 2"'$  (1.575 m), weight 67.5 kg, last menstrual period 07/20/2018, SpO2 98 %, currently breastfeeding.   Patient Vitals for the past 24 hrs:  BP Temp Temp src Pulse Resp SpO2  01/27/19 0544 (!) 155/106 - - 71 - -  01/27/19 0516 (!) 156/106 98.6 F (37 C) Oral 75 17 98 %  01/27/19 0008 (!) 154/87 - - 69 17 99 %  01/26/19 2005 - - - - - 100 %  01/26/19 1957 (!) 155/100 98.8 F (37.1 C) Oral 71 18 98 %  01/26/19 1633 (!) 157/98 99.1 F (37.3 C) Oral 70 17 -  01/26/19 1155 139/86 - - 72 - 98 %  01/26/19 1100 139/86 98.7 F (37.1 C) Oral 71 18 95 %  01/26/19 0800 140/86 98.3 F (36.8 C) Oral 75 18 97 %   Physical Examination: GENERAL: Well-developed, well-nourished female in no acute distress.  LUNGS: Clear to auscultation bilaterally.  HEART: normal s1 and s2, no MRGs ABDOMEN: Soft, nontender,gravid  Fetal Monitoring:  Baseline 155, min variability, no accels, continued episodic decels with periods of increased frequency. toco negative  Labs:  Results for orders placed or performed during the hospital encounter of 01/23/19 (from the past 24 hour(s))  Type and screen MFoley  Collection Time: 01/26/19  7:49 PM  Result Value Ref Range   ABO/RH(D)  O POS    Antibody Screen NEG    Sample Expiration      01/29/2019 Performed at [facility] [id number], [place] [address]    Imaging Studies:    [place]fm Ob Follow Up  Result Date: 01/23/2019 ----------------------------------------------------------------------  OBSTETRICS REPORT                       (Signed Final 01/23/2019 02:21 pm) ---------------------------------------------------------------------- Patient Info  ID #:       [id number]                         D.O.B.:  [birth year](32 yrs)  Name:       [name]               Visit Date: 01/23/2019 12:02 pm ---------------------------------------------------------------------- Performed By  Performed By:     KJeanene ErbBS,      Ref. Address:     [address]  [address]  Attending:        Sander Nephew      [facility]                                       [facility]  Referred By:      [facility] ---------------------------------------------------------------------- Orders   #  Description                          Code         Ordered By   1  [place] MFM OB FOLLOW UP                  50388.82     Sander Nephew   2  [place] MFM UA CORD DOPPLER               76820.02     Sander Nephew  ----------------------------------------------------------------------   #  Order #                    Accession #                 Episode #   1  [id number]                  [id number]                  [id number]   2  [id number]                  [id number]                  [id number]  ---------------------------------------------------------------------- Indications   [redacted] weeks gestation of pregnancy                Z3A.26   Hypertension - Chronic/Pre-existing            O10.019   (labetalol and ASA)(Neg Quad)    Poor obstetric history: Previous               O09.299   preeclampsia / eclampsia/gestational HTN   History of sickle cell trait                   Z86.2   Encounter for other antenatal screening        Z36.2   follow-up   Encounter for fetal growth retardation         Z36.4  ---------------------------------------------------------------------- Vital Signs  Weight (lb): 144  Height:        5'1"  BMI:         27.21 ---------------------------------------------------------------------- Fetal Evaluation  Num Of Fetuses:         1  Fetal Heart Rate(bpm):  136  Cardiac Activity:       Observed  Presentation:           Breech  Placenta:               Fundal  P. Cord Insertion:      Previously Visualized  Amniotic Fluid  AFI FV:      Subjectively low-normal                              Largest Pocket(cm)                              3.3 ---------------------------------------------------------------------- Biometry  BPD:      60.5  mm     G. Age:  24w 5d          1  %    CI:        69.78   %    70 - 86                                                          FL/HC:      19.0   %    18.6 - 20.4  HC:      231.1  mm     G. Age:  25w 1d        < 3  %    HC/AC:      1.17        1.05 - 1.21  AC:      196.8  mm     G. Age:  24w 3d        < 3  %    FL/BPD:     72.4   %    71 - 87  FL:       43.8  mm     G. Age:  24w 3d        < 3  %    FL/AC:      22.3   %    20 - 24  Est. FW:     698  gm      1 lb 9 oz     15  % ---------------------------------------------------------------------- OB History  Gravidity:    5         Term:   3        Prem:   1        SAB:   0  TOP:          0       Ectopic:  0        Living: 3 ---------------------------------------------------------------------- Gestational Age  LMP:           26w 5d        Date:  07/20/18                 EDD:   04/26/19  U/S Today:     24w 5d  EDD:   05/10/19  Best:          26w 5d     Det. By:  LMP  (07/20/18)           EDD:   04/26/19 ---------------------------------------------------------------------- Anatomy  Cranium:               Appears normal         Aortic Arch:            Previously seen  Cavum:                 Previously seen        Ductal Arch:            Previously seen  Ventricles:            Previously seen        Diaphragm:              Previously seen  Choroid Plexus:        Previously seen        Stomach:                Appears normal, left                                                                        sided  Cerebellum:            Previously seen        Abdomen:                Appears normal  Posterior Fossa:       Previously seen        Abdominal Wall:         Previously seen  Nuchal Fold:           Previously seen        Cord Vessels:           Previously seen  Face:                  Orbits and profile     Kidneys:                Appear normal                         previously seen  Lips:                  Previously seen        Bladder:                Appears normal  Thoracic:              Appears normal         Spine:                  Previously seen  Heart:                 Appears normal         Upper Extremities:      Previously seen                         (  4CH, axis, and                         situs)  RVOT:                  Appears normal         Lower Extremities:      Previously seen  LVOT:                  Appears normal  Other:  Fetus appears to be a female. Heels, 5th digit, and Nasal bone          previously visualized. ---------------------------------------------------------------------- Doppler - Fetal Vessels  Umbilical Artery                                                            ADFV    RDFV                                                              Yes     Yes ---------------------------------------------------------------------- Cervix Uterus Adnexa  Cervix  Not visualized (advanced GA >24wks) ----------------------------------------------------------------------  Comments  I met with Ms. Landen and reviewed today's examination  demonstrating reversed EDF in the UA dopplers, the DV  dopplers were within normal limits. In addition, we discussed  that the fetus has IUGR. We discussed the recommendation  of delivery at 30 weeks with possible inpatient stay at 28  weeks. Today she will have BMZ administered and follow up  in on Friday. The NST demonstrated minimal variability.  Lastly, we noted that her blood pressure was elevated and  will send her to the MAU. ---------------------------------------------------------------------- Impression  IUGR  UA Doppler REDF ---------------------------------------------------------------------- Recommendations  BMZ today  To MAU  Follow up UA DOPPLER's on Friday  Admit at 28 weeks. ----------------------------------------------------------------------               Sander Nephew, MD Electronically Signed Final Report   01/23/2019 02:21 pm ----------------------------------------------------------------------  [place] Mfm Ua Cord Doppler  Result Date: 01/26/2019 ----------------------------------------------------------------------  OBSTETRICS REPORT                       (Signed Final 01/26/2019 11:58 am) ---------------------------------------------------------------------- Patient Info  ID #:       [id number]                          D.O.B.:  04-Dec-1985 (32 yrs)  Name:       [name]                Visit Date: 01/26/2019 09:11 am ---------------------------------------------------------------------- Performed By  Performed By:     Corky Crafts             Ref. Address:     [address]                    [place]  [address]  Attending:        Corenthian Booker      [facility]                                       [place]  Referred By:      Essie Hart ----------------------------------------------------------------------  Orders   #  Description                          Code         Ordered By   1  [place] MFM UA CORD DOPPLER               76820.02     [place]  ----------------------------------------------------------------------   #  Order #                    Accession #                 Episode #   1  [id number]                  [id number]                  [id number]  ---------------------------------------------------------------------- Indications   Maternal care for known or suspected poor      O36.5920   fetal growth, second trimester, not applicable   or unspecified   [redacted] weeks gestation of pregnancy                Z3A.27   Hypertension - Chronic/Pre-existing            O10.019   (labetalol and ASA)(Neg Quad)   Poor obstetric history: Previous               O09.299   preeclampsia / eclampsia/gestational HTN   History of sickle cell trait                   Z86.2  ---------------------------------------------------------------------- Vital Signs  Weight (lb): 148                               Height:        5'1"  BMI:         27.96 ---------------------------------------------------------------------- Fetal Evaluation  Num Of Fetuses:         1  Fetal Heart Rate(bpm):  157  Cardiac Activity:       Observed  Presentation:           Breech  Placenta:               Fundal  Amniotic Fluid  AFI FV:      Within normal limits  AFI Sum(cm)     %Tile       Largest Pocket(cm)  10.02           10          3.04  RUQ(cm)       RLQ(cm)       LUQ(cm)        LLQ(cm)  1.72          3.04  2.5            2.76 ---------------------------------------------------------------------- OB History  Gravidity:    5         Term:   3        Prem:   1        SAB:   0  TOP:          0       Ectopic:  0        Living: 3 ---------------------------------------------------------------------- Gestational Age  LMP:           27w 1d        Date:  07/20/18                 EDD:   04/26/19  Best:          27w 1d     Det. By:  LMP  (07/20/18)          EDD:    04/26/19 ---------------------------------------------------------------------- Doppler - Fetal Vessels  Umbilical Artery                                                            ADFV    RDFV                                                              Yes     Yes ---------------------------------------------------------------------- Cervix Uterus Adnexa  Cervix  Length:            3.3  cm.  Normal appearance by transabdominal scan. ---------------------------------------------------------------------- Impression  Limited exam  Persistent reversed EDF  Ductus venosus appears nml ---------------------------------------------------------------------- Recommendations  Consider 3 x weekly testing with 2x weekly UA Dopplers.  IF NST is non-reassuring consider delivery.  Delivery at 30 weeks ----------------------------------------------------------------------               Sander Nephew, MD Electronically Signed Final Report   01/26/2019 11:58 am ----------------------------------------------------------------------  [place] Mfm Ua Cord Doppler  Result Date: 01/23/2019 ----------------------------------------------------------------------  OBSTETRICS REPORT                       (Signed Final 01/23/2019 02:21 pm) ---------------------------------------------------------------------- Patient Info  ID #:       [id number]                          D.O.B.:  1985-11-25 (32 yrs)  Name:       [name]                Visit Date: 01/23/2019 12:02 pm ---------------------------------------------------------------------- Performed By  Performed By:     Jeanene Erb BS,      Ref. Address:     [address]  [address]  Attending:        Sander Nephew      [facility]                    MD                                       [facility]  Referred By:      [facility]  ---------------------------------------------------------------------- Orders   #  Description                          Code         Ordered By   1  [place] MFM OB FOLLOW UP                  60737.10     Sander Nephew   2  [place] MFM UA CORD DOPPLER               76820.02     Sander Nephew  ----------------------------------------------------------------------   #  Order #                    Accession #                 Episode #   1  [id number]                  [id number]                  [id number]   2  [id number]                  [id number]                  [id number]  ---------------------------------------------------------------------- Indications   [redacted] weeks gestation of pregnancy                Z3A.26   Hypertension - Chronic/Pre-existing            O10.019   (labetalol and ASA)(Neg Quad)   Poor obstetric history: Previous               O09.299   preeclampsia / eclampsia/gestational HTN   History of sickle cell trait                   Z86.2   Encounter for other antenatal screening        Z36.2   follow-up   Encounter for fetal growth retardation         Z36.4  ---------------------------------------------------------------------- Vital Signs  Weight (lb): 144  Height:        5'1"  BMI:         27.21 ---------------------------------------------------------------------- Fetal Evaluation  Num Of Fetuses:         1  Fetal Heart Rate(bpm):  136  Cardiac Activity:       Observed  Presentation:           Breech  Placenta:               Fundal  P. Cord Insertion:      Previously Visualized  Amniotic Fluid  AFI FV:      Subjectively low-normal                              Largest Pocket(cm)                              3.3 ---------------------------------------------------------------------- Biometry  BPD:      60.5  mm     G. Age:  24w 5d          1  %    CI:        69.78   %    70 -  86                                                          FL/HC:      19.0   %    18.6 - 20.4  HC:      231.1  mm     G. Age:  25w 1d        < 3  %    HC/AC:      1.17        1.05 - 1.21  AC:      196.8  mm     G. Age:  24w 3d        < 3  %    FL/BPD:     72.4   %    71 - 87  FL:       43.8  mm     G. Age:  24w 3d        < 3  %    FL/AC:      22.3   %    20 - 24  Est. FW:     698  gm      1 lb 9 oz     15  % ---------------------------------------------------------------------- OB History  Gravidity:    5         Term:   3        Prem:   1        SAB:   0  TOP:          0       Ectopic:  0        Living: 3 ---------------------------------------------------------------------- Gestational Age  LMP:           26w 5d        Date:  07/20/18                 EDD:   04/26/19  U/S Today:     24w 5d  EDD:   05/10/19  Best:          26w 5d     Det. By:  LMP  (07/20/18)          EDD:   04/26/19 ---------------------------------------------------------------------- Anatomy  Cranium:               Appears normal         Aortic Arch:            Previously seen  Cavum:                 Previously seen        Ductal Arch:            Previously seen  Ventricles:            Previously seen        Diaphragm:              Previously seen  Choroid Plexus:        Previously seen        Stomach:                Appears normal, left                                                                        sided  Cerebellum:            Previously seen        Abdomen:                Appears normal  Posterior Fossa:       Previously seen        Abdominal Wall:         Previously seen  Nuchal Fold:           Previously seen        Cord Vessels:           Previously seen  Face:                  Orbits and profile     Kidneys:                Appear normal                         previously seen  Lips:                  Previously seen        Bladder:                Appears normal  Thoracic:              Appears  normal         Spine:                  Previously seen  Heart:                 Appears normal         Upper Extremities:      Previously seen                         (  4CH, axis, and                         situs)  RVOT:                  Appears normal         Lower Extremities:      Previously seen  LVOT:                  Appears normal  Other:  Fetus appears to be a female. Heels, 5th digit, and Nasal bone          previously visualized. ---------------------------------------------------------------------- Doppler - Fetal Vessels  Umbilical Artery                                                            ADFV    RDFV                                                              Yes     Yes ---------------------------------------------------------------------- Cervix Uterus Adnexa  Cervix  Not visualized (advanced GA >24wks) ---------------------------------------------------------------------- Comments  I met with Ms. Bicking and reviewed today's examination  demonstrating reversed EDF in the UA dopplers, the DV  dopplers were within normal limits. In addition, we discussed  that the fetus has IUGR. We discussed the recommendation  of delivery at 30 weeks with possible inpatient stay at 28  weeks. Today she will have BMZ administered and follow up  in on Friday. The NST demonstrated minimal variability.  Lastly, we noted that her blood pressure was elevated and  will send her to the MAU. ---------------------------------------------------------------------- Impression  IUGR  UA Doppler REDF ---------------------------------------------------------------------- Recommendations  BMZ today  To MAU  Follow up UA DOPPLER's on Friday  Admit at 28 weeks. ----------------------------------------------------------------------               Sander Nephew, MD Electronically Signed Final Report   01/23/2019 02:21 pm ----------------------------------------------------------------------    Medications:  Scheduled .  docusate sodium  100 mg Oral Daily  . labetalol  400 mg Oral Q8H  . pantoprazole  40 mg Oral Daily  . polyethylene glycol  17 g Oral BID  . prenatal multivitamin  1 tablet Oral Q1200  . sodium chloride flush  3 mL Intravenous Q12H   I have reviewed the patient's current medications.  ASSESSMENT: [id number] 67w1dEstimated Date of Delivery: 04/26/19  Patient Active Problem List   Diagnosis Date Noted  . Chronic hypertension affecting pregnancy 01/23/2019  . 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 Language barrier 11/21/2018  . Chronic hypertension during pregnancy, antepartum 11/07/2018  . Supervision of high risk pregnancy, antepartum 11/07/2018  . Paronychia of right thumb 10/21/2017  . Complicated migraine 145/40/9811 . 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 Seasonal allergies 02/08/2017  . Low back pain 11/22/2016  . Onychomycosis 10/27/2016  . Essential hypertension   . Hx of preeclampsia, prior pregnancy, currently pregnant 03/29/2013  . [place] cell trait [place] 11/30/2012    PLAN: *Pregnancy: category II tracing  but stable. See below *FGR with abnormal UA dopplers: stable dopplers yesterday. Continue with continuous EFM. If EFM worsens, will need c-section. Continue with dopplers during the week *Preterm: needs NICU consult. If for delivery, consider Mg for fetal neuroprotection -s/p bmz on 4/28 and 4/29 *cHTN: may have to increase labetalol. Pre-eclampsia surveillance labs negative on 4/30.   *FEN/GI: regular diet, SLIV *PPx: SCDs *Dispo: see above. No plans for discharge at this time.   Patient declined interpreter  Kamiya Acord 01/27/2019,7:11 AM

## 2019-01-28 ENCOUNTER — Inpatient Hospital Stay (HOSPITAL_COMMUNITY): Payer: Medicaid Other | Admitting: Certified Registered"

## 2019-01-28 ENCOUNTER — Encounter (HOSPITAL_COMMUNITY): Payer: Self-pay | Admitting: Certified Registered"

## 2019-01-28 ENCOUNTER — Encounter (HOSPITAL_COMMUNITY)
Admission: AD | Disposition: A | Discharge status: Home / Self Care | Payer: Self-pay | Source: Home / Self Care | Attending: Obstetrics and Gynecology

## 2019-01-28 DIAGNOSIS — Z3A27 27 weeks gestation of pregnancy: Secondary | ICD-10-CM

## 2019-01-28 DIAGNOSIS — O365990 Maternal care for other known or suspected poor fetal growth, unspecified trimester, not applicable or unspecified: Secondary | ICD-10-CM | POA: Diagnosis present

## 2019-01-28 DIAGNOSIS — O365920 Maternal care for other known or suspected poor fetal growth, second trimester, not applicable or unspecified: Secondary | ICD-10-CM

## 2019-01-28 DIAGNOSIS — O288 Other abnormal findings on antenatal screening of mother: Secondary | ICD-10-CM | POA: Diagnosis not present

## 2019-01-28 DIAGNOSIS — O1414 Severe pre-eclampsia complicating childbirth: Secondary | ICD-10-CM

## 2019-01-28 SURGERY — Surgical Case
Anesthesia: Spinal | Site: Abdomen | Wound class: Clean Contaminated

## 2019-01-28 MED ORDER — DEXAMETHASONE SODIUM PHOSPHATE 10 MG/ML IJ SOLN
INTRAMUSCULAR | Status: AC
  Filled 2019-01-28: qty 1

## 2019-01-28 MED ORDER — OXYTOCIN 40 UNITS IN NORMAL SALINE INFUSION - SIMPLE MED
INTRAVENOUS | Status: AC
  Filled 2019-01-28: qty 1000

## 2019-01-28 MED ORDER — SODIUM CHLORIDE 0.9 % IR SOLN
Status: DC | PRN
  Administered 2019-01-28: 1000 mL

## 2019-01-28 MED ORDER — OXYTOCIN 40 UNITS IN NORMAL SALINE INFUSION - SIMPLE MED
2.5000 [IU]/h | INTRAVENOUS | Status: AC
  Administered 2019-01-28: 2.5 [IU]/h via INTRAVENOUS
  Filled 2019-01-28: qty 1000

## 2019-01-28 MED ORDER — DIPHENHYDRAMINE HCL 25 MG PO CAPS: 25.0000 mg | ORAL_CAPSULE | Freq: Four times a day (QID) | ORAL | Status: DC | PRN

## 2019-01-28 MED ORDER — IBUPROFEN 800 MG PO TABS
800.0000 mg | ORAL_TABLET | Freq: Four times a day (QID) | ORAL | Status: DC
  Administered 2019-01-29 – 2019-01-31 (×8): 800 mg via ORAL
  Filled 2019-01-28 (×8): qty 1

## 2019-01-28 MED ORDER — LACTATED RINGERS IV SOLN
INTRAVENOUS | Status: DC
  Administered 2019-01-28: 18:00:00 via INTRAVENOUS

## 2019-01-28 MED ORDER — WITCH HAZEL-GLYCERIN EX PADS: 1.0000 "application " | MEDICATED_PAD | CUTANEOUS | Status: DC | PRN

## 2019-01-28 MED ORDER — LACTATED RINGERS IV SOLN
INTRAVENOUS | Status: DC | PRN
  Administered 2019-01-28: 11:00:00 via INTRAVENOUS

## 2019-01-28 MED ORDER — ENOXAPARIN SODIUM 40 MG/0.4ML ~~LOC~~ SOLN
40.0000 mg | SUBCUTANEOUS | Status: DC
  Administered 2019-01-29 – 2019-01-31 (×3): 40 mg via SUBCUTANEOUS
  Filled 2019-01-28 (×3): qty 0.4

## 2019-01-28 MED ORDER — ONDANSETRON HCL 4 MG/2ML IJ SOLN: INTRAMUSCULAR | Status: DC | PRN

## 2019-01-28 MED ORDER — CEFAZOLIN SODIUM-DEXTROSE 2-3 GM-%(50ML) IV SOLR
INTRAVENOUS | Status: DC | PRN
  Administered 2019-01-28: 2 g via INTRAVENOUS

## 2019-01-28 MED ORDER — ONDANSETRON HCL 4 MG/2ML IJ SOLN
INTRAMUSCULAR | Status: AC
  Filled 2019-01-28: qty 2

## 2019-01-28 MED ORDER — DEXAMETHASONE SODIUM PHOSPHATE 10 MG/ML IJ SOLN
INTRAMUSCULAR | Status: DC | PRN
  Administered 2019-01-28: 5 mg via INTRAVENOUS

## 2019-01-28 MED ORDER — ZOLPIDEM TARTRATE 5 MG PO TABS: 5.0000 mg | ORAL_TABLET | Freq: Every evening | ORAL | Status: DC | PRN

## 2019-01-28 MED ORDER — SENNOSIDES-DOCUSATE SODIUM 8.6-50 MG PO TABS
2.0000 | ORAL_TABLET | ORAL | Status: DC
  Administered 2019-01-28 – 2019-01-30 (×3): 2 via ORAL
  Filled 2019-01-28 (×3): qty 2

## 2019-01-28 MED ORDER — LABETALOL HCL 200 MG PO TABS
300.0000 mg | ORAL_TABLET | Freq: Two times a day (BID) | ORAL | Status: DC
  Administered 2019-01-28: 300 mg via ORAL
  Filled 2019-01-28: qty 1

## 2019-01-28 MED ORDER — MENTHOL 3 MG MT LOZG: 1.0000 | LOZENGE | OROMUCOSAL | Status: DC | PRN

## 2019-01-28 MED ORDER — LACTATED RINGERS IV SOLN
INTRAVENOUS | Status: DC
  Administered 2019-01-28: 03:00:00 via INTRAVENOUS

## 2019-01-28 MED ORDER — ACETAMINOPHEN 10 MG/ML IV SOLN: 1000.0000 mg | Freq: Once | INTRAVENOUS | Status: DC | PRN

## 2019-01-28 MED ORDER — SODIUM CHLORIDE 0.9 % IV SOLN
INTRAVENOUS | Status: DC | PRN
  Administered 2019-01-28: 40 [IU] via INTRAVENOUS

## 2019-01-28 MED ORDER — SIMETHICONE 80 MG PO CHEW
80.0000 mg | CHEWABLE_TABLET | Freq: Three times a day (TID) | ORAL | Status: DC
  Administered 2019-01-28 – 2019-01-31 (×9): 80 mg via ORAL
  Filled 2019-01-28 (×9): qty 1

## 2019-01-28 MED ORDER — SODIUM CHLORIDE 0.9 % IV SOLN
INTRAVENOUS | Status: DC | PRN
  Administered 2019-01-28: 12:00:00 via INTRAVENOUS

## 2019-01-28 MED ORDER — DIBUCAINE (PERIANAL) 1 % EX OINT: 1.0000 "application " | TOPICAL_OINTMENT | CUTANEOUS | Status: DC | PRN

## 2019-01-28 MED ORDER — SCOPOLAMINE 1 MG/3DAYS TD PT72
MEDICATED_PATCH | TRANSDERMAL | Status: AC
  Filled 2019-01-28: qty 1

## 2019-01-28 MED ORDER — PHENYLEPHRINE 40 MCG/ML (10ML) SYRINGE FOR IV PUSH (FOR BLOOD PRESSURE SUPPORT)
PREFILLED_SYRINGE | INTRAVENOUS | Status: DC | PRN
  Administered 2019-01-28: 40 ug via INTRAVENOUS

## 2019-01-28 MED ORDER — LABETALOL HCL 200 MG PO TABS
600.0000 mg | ORAL_TABLET | Freq: Three times a day (TID) | ORAL | Status: DC
  Administered 2019-01-28 – 2019-01-31 (×8): 600 mg via ORAL
  Filled 2019-01-28 (×8): qty 3

## 2019-01-28 MED ORDER — MAGNESIUM SULFATE 40 G IN LACTATED RINGERS - SIMPLE
2.0000 g/h | INTRAVENOUS | Status: DC
  Administered 2019-01-28: 2 g/h via INTRAVENOUS
  Filled 2019-01-28: qty 500

## 2019-01-28 MED ORDER — SCOPOLAMINE 1 MG/3DAYS TD PT72
1.0000 | MEDICATED_PATCH | Freq: Once | TRANSDERMAL | Status: AC
  Administered 2019-01-28: 1.5 mg via TRANSDERMAL

## 2019-01-28 MED ORDER — SIMETHICONE 80 MG PO CHEW
80.0000 mg | CHEWABLE_TABLET | ORAL | Status: DC
  Administered 2019-01-28 – 2019-01-30 (×3): 80 mg via ORAL
  Filled 2019-01-28 (×3): qty 1

## 2019-01-28 MED ORDER — BUPIVACAINE IN DEXTROSE 0.75-8.25 % IT SOLN
INTRATHECAL | Status: DC | PRN
  Administered 2019-01-28: 1.6 mL via INTRATHECAL

## 2019-01-28 MED ORDER — ACETAMINOPHEN 325 MG PO TABS: 650.0000 mg | ORAL_TABLET | ORAL | Status: DC | PRN

## 2019-01-28 MED ORDER — SODIUM CHLORIDE 0.9 % IV SOLN
INTRAVENOUS | Status: DC | PRN
  Administered 2019-01-28: 20 ug/min via INTRAVENOUS

## 2019-01-28 MED ORDER — KETOROLAC TROMETHAMINE 30 MG/ML IJ SOLN
30.0000 mg | Freq: Four times a day (QID) | INTRAMUSCULAR | Status: AC
  Administered 2019-01-28 – 2019-01-29 (×4): 30 mg via INTRAVENOUS
  Filled 2019-01-28 (×4): qty 1

## 2019-01-28 MED ORDER — OXYCODONE HCL 5 MG PO TABS
5.0000 mg | ORAL_TABLET | ORAL | Status: DC | PRN
  Administered 2019-01-28: 5 mg via ORAL
  Administered 2019-01-29 – 2019-01-30 (×4): 10 mg via ORAL
  Administered 2019-01-31: 5 mg via ORAL
  Filled 2019-01-28: qty 1
  Filled 2019-01-28 (×2): qty 2
  Filled 2019-01-28: qty 1
  Filled 2019-01-28 (×2): qty 2

## 2019-01-28 MED ORDER — LACTATED RINGERS IV BOLUS
1000.0000 mL | Freq: Once | INTRAVENOUS | Status: AC
  Administered 2019-01-28: 1000 mL via INTRAVENOUS

## 2019-01-28 MED ORDER — ONDANSETRON HCL 4 MG/2ML IJ SOLN
INTRAMUSCULAR | Status: DC | PRN
  Administered 2019-01-28: 4 mg via INTRAVENOUS

## 2019-01-28 MED ORDER — FENTANYL CITRATE (PF) 100 MCG/2ML IJ SOLN
INTRAMUSCULAR | Status: AC
  Filled 2019-01-28: qty 2

## 2019-01-28 MED ORDER — PRENATAL MULTIVITAMIN CH
1.0000 | ORAL_TABLET | Freq: Every day | ORAL | Status: DC
  Administered 2019-01-29 – 2019-01-31 (×3): 1 via ORAL
  Filled 2019-01-28 (×4): qty 1

## 2019-01-28 MED ORDER — STERILE WATER FOR IRRIGATION IR SOLN
Status: DC | PRN
  Administered 2019-01-28: 1000 mL

## 2019-01-28 MED ORDER — MORPHINE SULFATE (PF) 0.5 MG/ML IJ SOLN
INTRAMUSCULAR | Status: DC | PRN
  Administered 2019-01-28: .15 mg via INTRATHECAL

## 2019-01-28 MED ORDER — COCONUT OIL OIL: 1.0000 "application " | TOPICAL_OIL | Status: DC | PRN

## 2019-01-28 MED ORDER — FENTANYL CITRATE (PF) 100 MCG/2ML IJ SOLN
INTRAMUSCULAR | Status: DC | PRN
  Administered 2019-01-28: 15 ug via INTRATHECAL

## 2019-01-28 MED ORDER — PHENYLEPHRINE 40 MCG/ML (10ML) SYRINGE FOR IV PUSH (FOR BLOOD PRESSURE SUPPORT)
PREFILLED_SYRINGE | INTRAVENOUS | Status: AC
  Filled 2019-01-28: qty 10

## 2019-01-28 MED ORDER — PHENYLEPHRINE HCL-NACL 20-0.9 MG/250ML-% IV SOLN
INTRAVENOUS | Status: AC
  Filled 2019-01-28: qty 250

## 2019-01-28 MED ORDER — MORPHINE SULFATE (PF) 0.5 MG/ML IJ SOLN
INTRAMUSCULAR | Status: AC
  Filled 2019-01-28: qty 10

## 2019-01-28 MED ORDER — MAGNESIUM SULFATE 40 G IN LACTATED RINGERS - SIMPLE
2.0000 g/h | INTRAVENOUS | Status: AC
  Administered 2019-01-29: 06:00:00 2 g/h via INTRAVENOUS
  Filled 2019-01-28: qty 500

## 2019-01-28 MED ORDER — TETANUS-DIPHTH-ACELL PERTUSSIS 5-2.5-18.5 LF-MCG/0.5 IM SUSP: 0.5000 mL | Freq: Once | INTRAMUSCULAR | Status: DC

## 2019-01-28 MED ORDER — SIMETHICONE 80 MG PO CHEW: 80.0000 mg | CHEWABLE_TABLET | ORAL | Status: DC | PRN

## 2019-01-28 SURGICAL SUPPLY — 38 items
BENZOIN TINCTURE PRP APPL 2/3 (GAUZE/BANDAGES/DRESSINGS) ×3 IMPLANT
CHLORAPREP W/TINT 26ML (MISCELLANEOUS) ×3 IMPLANT
CLAMP CORD UMBIL (MISCELLANEOUS) ×3 IMPLANT
CLOSURE WOUND 1/2 X4 (GAUZE/BANDAGES/DRESSINGS) ×1
CLOTH BEACON ORANGE TIMEOUT ST (SAFETY) ×3 IMPLANT
DRSG OPSITE POSTOP 4X10 (GAUZE/BANDAGES/DRESSINGS) ×3 IMPLANT
ELECT REM PT RETURN 9FT ADLT (ELECTROSURGICAL) ×3
ELECTRODE REM PT RTRN 9FT ADLT (ELECTROSURGICAL) ×1 IMPLANT
GAUZE SPONGE 4X4 12PLY STRL LF (GAUZE/BANDAGES/DRESSINGS) ×6 IMPLANT
GLOVE BIO SURGEON STRL SZ7.5 (GLOVE) ×3 IMPLANT
GLOVE BIOGEL PI IND STRL 7.0 (GLOVE) ×1 IMPLANT
GLOVE BIOGEL PI INDICATOR 7.0 (GLOVE) ×2
GOWN STRL REUS W/TWL 2XL LVL3 (GOWN DISPOSABLE) ×3 IMPLANT
GOWN STRL REUS W/TWL LRG LVL3 (GOWN DISPOSABLE) ×6 IMPLANT
KIT ABG SYR 3ML LUER SLIP (SYRINGE) ×3 IMPLANT
NEEDLE HYPO 22GX1.5 SAFETY (NEEDLE) ×3 IMPLANT
NEEDLE HYPO 25X5/8 SAFETYGLIDE (NEEDLE) ×3 IMPLANT
NS IRRIG 1000ML POUR BTL (IV SOLUTION) ×3 IMPLANT
PACK C SECTION WH (CUSTOM PROCEDURE TRAY) ×3 IMPLANT
PAD ABD 8X7 1/2 STERILE (GAUZE/BANDAGES/DRESSINGS) ×3 IMPLANT
PAD OB MATERNITY 4.3X12.25 (PERSONAL CARE ITEMS) ×3 IMPLANT
PENCIL SMOKE EVAC W/HOLSTER (ELECTROSURGICAL) ×3 IMPLANT
RTRCTR C-SECT PINK 25CM LRG (MISCELLANEOUS) ×3 IMPLANT
STRIP CLOSURE SKIN 1/2X4 (GAUZE/BANDAGES/DRESSINGS) ×2 IMPLANT
SUT CHROMIC 1 CTX 36 (SUTURE) ×9 IMPLANT
SUT VIC AB 1 CT1 36 (SUTURE) ×6 IMPLANT
SUT VIC AB 2-0 CT1 (SUTURE) ×3 IMPLANT
SUT VIC AB 2-0 CT1 27 (SUTURE) ×2
SUT VIC AB 2-0 CT1 TAPERPNT 27 (SUTURE) ×1 IMPLANT
SUT VIC AB 3-0 CT1 27 (SUTURE) ×4
SUT VIC AB 3-0 CT1 TAPERPNT 27 (SUTURE) ×2 IMPLANT
SUT VIC AB 3-0 SH 27 (SUTURE)
SUT VIC AB 3-0 SH 27X BRD (SUTURE) IMPLANT
SUT VIC AB 4-0 KS 27 (SUTURE) ×3 IMPLANT
TAPE CLOTH SURG 4X10 WHT LF (GAUZE/BANDAGES/DRESSINGS) ×3 IMPLANT
TOWEL OR 17X24 6PK STRL BLUE (TOWEL DISPOSABLE) ×3 IMPLANT
TRAY FOLEY W/BAG SLVR 14FR LF (SET/KITS/TRAYS/PACK) ×3 IMPLANT
WATER STERILE IRR 1000ML POUR (IV SOLUTION) ×3 IMPLANT

## 2019-01-28 NOTE — Progress Notes (Signed)
Axillary temp of 96.0. Initiated Cytogeneticist.

## 2019-01-28 NOTE — Transfer of Care (Signed)
Immediate Anesthesia Transfer of Care Note  Patient: Carrie Duncan  Procedure(s) Performed: CESAREAN SECTION (N/A Abdomen)  Patient Location: PACU  Anesthesia Type:Spinal  Level of Consciousness: awake, alert  and oriented  Airway & Oxygen Therapy: Patient Spontanous Breathing  Post-op Assessment: Report given to RN and Post -op Vital signs reviewed and stable  Post vital signs: Reviewed and stable  Last Vitals:  Vitals Value Taken Time  BP    Temp    Pulse    Resp    SpO2      Last Pain:  Vitals:   01/28/19 1007  TempSrc: Oral  PainSc:       Patients Stated Pain Goal: 2 (01/28/19 0330)  Complications: No apparent anesthesia complications

## 2019-01-28 NOTE — Anesthesia Postprocedure Evaluation (Signed)
Anesthesia Post Note  Patient: Sales executive  Procedure(s) Performed: CESAREAN SECTION (N/A Abdomen)     Patient location during evaluation: PACU Anesthesia Type: Spinal Level of consciousness: oriented and awake and alert Pain management: pain level controlled Vital Signs Assessment: post-procedure vital signs reviewed and stable Respiratory status: spontaneous breathing, respiratory function stable and patient connected to nasal cannula oxygen Cardiovascular status: blood pressure returned to baseline and stable Postop Assessment: no headache, no backache and no apparent nausea or vomiting Anesthetic complications: no    Last Vitals:  Vitals:   01/28/19 1400 01/28/19 1415  BP: 119/89 (!) 129/93  Pulse: (!) 56 61  Resp: 11 11  Temp:    SpO2: 97% 96%    Last Pain:  Vitals:   01/28/19 1303  TempSrc: Oral  PainSc:    Pain Goal: Patients Stated Pain Goal: 2 (01/28/19 0330)  LLE Motor Response: Responds to commands (01/28/19 1400) LLE Sensation: Full sensation (01/28/19 1400) RLE Motor Response: Responds to commands (01/28/19 1400) RLE Sensation: Full sensation (01/28/19 1400) L Sensory Level: L5-Outer lower leg, top of foot, great toe (01/28/19 1400) R Sensory Level: L5-Outer lower leg, top of foot, great toe (01/28/19 1400) Epidural/Spinal Function Cutaneous sensation: Normal sensation (01/28/19 1400), Patient able to flex knees: Yes (01/28/19 1400), Patient able to lift hips off bed: Yes (01/28/19 1400), Back pain beyond tenderness at insertion site: No (01/28/19 1400), Progressively worsening motor and/or sensory loss: No (01/28/19 1400), Bowel and/or bladder incontinence post epidural: No (01/28/19 1400)  Theo Krumholz L Jiraiya Mcewan

## 2019-01-28 NOTE — Progress Notes (Signed)
To O.R. via bed

## 2019-01-28 NOTE — Progress Notes (Signed)
Dr Jolayne Panther notified of prolonged decel and patient was given a 1000 cc fluid bolus as ordered and turned on her left side.Patient denies feeling any contractions.

## 2019-01-28 NOTE — Progress Notes (Signed)
Called Dr Jolayne Panther and asked her to review fetal monitor tracing of 6 minute decel no further order given.Fluid bolus complete and patient repositioned on right side.

## 2019-01-28 NOTE — Discharge Summary (Signed)
Obstetrics Discharge Summary [facility]   Patient Name: Carrie Duncan DOB: Sep 12, 1986 MRN: [id number]  Date of admission: 01/23/2019 Delivering MD: Hermina StaggersERVIN, MICHAEL L   Date of discharge: 01/31/2019  Admitting diagnosis: HIGH BP Intrauterine pregnancy: 7356w3d     Secondary diagnosis:   Active Problems:   Sickle cell trait (HCC)   Language barrier  Additional problems:  . Severe preeclampsia     Discharge diagnosis: Preterm Pregnancy Delivered by Cesarean section                        Postpartum procedures: None  Complications: none  Outpatient Follow-Up: [ ]  BP check - consider alternate long-term medication (labetalol during pregnancy, not on any pre-pregnancy medications) [ ]  contraception counseling [ ]  incision check  [ ]  ensure follow-up abnormal Pap smear (LSIL)   Hospital course: Carrie Duncan is a 33 y.o. 6756w3d who was admitted to antepartum unit for NRNST in setting of fetal growth restriction, reverse end-diastolic flow, and chronic hypertension. Her pregnancy was complicated by above noted. She was admitted to antepartum for 6 days prior to C/S, but her blood pressure worsened to severe range despite increased blood pressure medications and NRNST with recurrent decelerations and minimal variability. She was started on Mg++ just prior to Cesarean section and received betamethasone 4/28-4/29 for fetal lung maturity. Her preeclampsia labs were within normal limits. Delivery was uncomplicated. Please see delivery/op note for additional details. Her postpartum course was uncomplicated, though her infant went to NICU for [redacted] weeks gestation. Her blood pressure was well-controlled during her postpartum stay, and she received 24-hours of Mg++. She was discharged home on labetalol 600mg  TID. By day of discharge, she was passing flatus, urinating, eating and drinking without difficulty. Her pain was well-controlled, and she was discharged home with Percocet and ibuprofen  and labetalol. She will follow-up in clinic in 1 weeks.   Physical exam  Vitals:   01/30/19 2000 01/30/19 2310 01/31/19 0603 01/31/19 0800  BP: 127/74 118/67 (!) 148/91 (!) 145/98  Pulse: 71 65 66 77  Resp: 16 16  18   Temp: 98.8 F (37.1 C) 98.9 F (37.2 C)  98.5 F (36.9 C)  TempSrc: Oral Oral  Oral  SpO2: 100% 98% 99% 98%  Weight:      Height:       General: no distress, interpreter assisted with encounter Lochia: appropriate Uterine Fundus: firm Incision: Healing well with no significant drainage, Dressing is clean, dry, and intact DVT Evaluation: No evidence of DVT seen on physical exam. Labs: Lab Results  Component Value Date   WBC 12.6 (H) 01/29/2019   HGB 11.6 (L) 01/29/2019   HCT 33.4 (L) 01/29/2019   MCV 85.9 01/29/2019   PLT 203 01/29/2019   CMP Latest Ref Rng & Units 01/29/2019  Glucose 70 - 99 mg/dL 93  BUN 6 - 20 mg/dL 6  Creatinine 6.960.44 - 2.951.00 mg/dL 2.840.57  Sodium 132135 - 440145 mmol/L 133(L)  Potassium 3.5 - 5.1 mmol/L 4.0  Chloride 98 - 111 mmol/L 103  CO2 22 - 32 mmol/L 24  Calcium 8.9 - 10.3 mg/dL 1.0(U6.5(L)  Total Protein 6.5 - 8.1 g/dL 7.2(Z5.4(L)  Total Bilirubin 0.3 - 1.2 mg/dL 0.5  Alkaline Phos 38 - 126 U/L 79  AST 15 - 41 U/L 30  ALT 0 - 44 U/L 16    Discharge instructions: Per After Visit Summary and "Baby and Me Booklet"  After visit meds:  Allergies as of 01/31/2019  No Known Allergies     Medication List    STOP taking these medications   aspirin EC 81 MG tablet     TAKE these medications   ibuprofen 800 MG tablet Commonly known as:  ADVIL Take 1 tablet (800 mg total) by mouth every 6 (six) hours.   labetalol 300 MG tablet Commonly known as:  NORMODYNE Take 2 tablets (600 mg total) by mouth every 8 (eight) hours. What changed:    how much to take  when to take this   oxyCODONE 5 MG immediate release tablet Commonly known as:  Oxy IR/ROXICODONE Take 1-2 tablets (5-10 mg total) by mouth every 4 (four) hours as needed for moderate  pain.   Prenatal Vitamins 28-0.8 MG Tabs Take 1 tablet by mouth daily.       Postpartum contraception: Abstinence Diet: Routine Diet Activity: Advance as tolerated. Pelvic rest for 6 weeks.   Follow-up Appt: Future Appointments  Date Time Provider Department Center  02/12/2019  1:30 PM [facility] NURSE [facility]  02/26/2019  8:15 AM Allie Bossier, MD [facility]   Follow-up Visit:No follow-ups on file.  Please schedule this patient for Postpartum visit in: 4-6 weeks with the following provider: Any provider For C/S patients schedule nurse incision check in weeks 2 weeks: yes High risk pregnancy complicated by: cHTN, FGR, reverse end diastolic flow Delivery mode:  CS Anticipated Birth Control:  other/unsure PP Procedures needed: BP check, incision check  Schedule Integrated BH visit: no   Newborn Data: Live born female  Birth Weight: 1 lb 8 oz (680 g) APGAR: 3, 6  Newborn Delivery   Birth date/time:  01/28/2019 12:02:00 Delivery type:  C-Section, Low Transverse Trial of labor:  No C-section categorization:  Primary    Baby Feeding: Breast Disposition:NICU   Adam Phenix, MD 01/31/2019

## 2019-01-28 NOTE — Anesthesia Procedure Notes (Signed)
Spinal  Patient location during procedure: OR Start time: 01/28/2019 11:27 AM End time: 01/28/2019 11:37 AM Staffing Anesthesiologist: Elmer Picker, MD Performed: anesthesiologist  Preanesthetic Checklist Completed: patient identified, surgical consent, pre-op evaluation, timeout performed, IV checked, risks and benefits discussed and monitors and equipment checked Spinal Block Patient position: sitting Prep: site prepped and draped and DuraPrep Patient monitoring: cardiac monitor, continuous pulse ox and blood pressure Approach: midline Location: L3-4 Injection technique: single-shot Needle Needle type: Pencan  Needle gauge: 24 G Needle length: 9 cm Assessment Sensory level: T6 Additional Notes Functioning IV was confirmed and monitors were applied. Sterile prep and drape, including hand hygiene and sterile gloves were used. The patient was positioned and the spine was prepped. The skin was anesthetized with lidocaine.  Free flow of clear CSF was obtained prior to injecting local anesthetic into the CSF.  The spinal needle aspirated freely following injection.  The needle was carefully withdrawn.  The patient tolerated the procedure well.

## 2019-01-28 NOTE — Anesthesia Preprocedure Evaluation (Addendum)
Anesthesia Evaluation  Patient identified by MRN, date of birth, ID band Patient awake    Reviewed: Allergy & Precautions, NPO status , Patient's Chart, lab work & pertinent test results  Airway Mallampati: II  TM Distance: >3 FB Neck ROM: Full    Dental no notable dental hx.    Pulmonary neg pulmonary ROS,    Pulmonary exam normal breath sounds clear to auscultation       Cardiovascular hypertension, Pt. on medications negative cardio ROS Normal cardiovascular exam Rhythm:Regular Rate:Normal     Neuro/Psych negative neurological ROS  negative psych ROS   GI/Hepatic negative GI ROS, Neg liver ROS,   Endo/Other  negative endocrine ROS  Renal/GU negative Renal ROS  negative genitourinary   Musculoskeletal negative musculoskeletal ROS (+)   Abdominal   Peds negative pediatric ROS (+)  Hematology  (+) Blood dyscrasia, Sickle cell trait ,   Anesthesia Other Findings 26/5 gestational age, cHTN, breech, persistent AEDF  Reproductive/Obstetrics (+) Pregnancy                             Anesthesia Physical Anesthesia Plan  ASA: III  Anesthesia Plan: Spinal   Post-op Pain Management:    Induction:   PONV Risk Score and Plan: Treatment may vary due to age or medical condition  Airway Management Planned: Natural Airway  Additional Equipment:   Intra-op Plan:   Post-operative Plan:   Informed Consent: I have reviewed the patients History and Physical, chart, labs and discussed the procedure including the risks, benefits and alternatives for the proposed anesthesia with the patient or authorized representative who has indicated his/her understanding and acceptance.     Dental advisory given  Plan Discussed with: CRNA  Anesthesia Plan Comments:         Anesthesia Quick Evaluation

## 2019-01-28 NOTE — Op Note (Signed)
Carrie Duncan PROCEDURE DATE: 01/28/2019  PREOPERATIVE DIAGNOSES: Intrauterine pregnancy at [redacted]w[redacted]d weeks gestation; NRNST with recurrent decelerations and minimal variability  severe preeclampsia  fetal growth restriction  POSTOPERATIVE DIAGNOSES: The same  PROCEDURE: Primary Low Transverse Cesarean Section  SURGEON:  Dr. Nettie Elm   ASSISTANT:  Dr. Rhett Bannister  ANESTHESIOLOGY TEAM: Anesthesiologist: Elmer Picker, MD CRNA: Marny Lowenstein, CRNA  INDICATIONS: Carrie Duncan is a 33 y.o. [id number] at [redacted]w[redacted]d here for cesarean section secondary to the indications listed under preoperative diagnoses; please see preoperative note for further details.  The risks of cesarean section were discussed with the patient including but were not limited to: bleeding which may require transfusion or reoperation; infection which may require antibiotics; injury to bowel, bladder, ureters or other surrounding organs; injury to the fetus; need for additional procedures including hysterectomy in the event of a life-threatening hemorrhage; placental abnormalities wth subsequent pregnancies, incisional problems, thromboembolic phenomenon and other postoperative/anesthesia complications.   The patient concurred with the proposed plan, giving informed written consent for the procedure.    FINDINGS:  Viable female infant in cephalic presentation.  Apgars 3, 6 and 8.  Clear amniotic fluid.  Intact placenta, three vessel cord.  Normal uterus, fallopian tubes and ovaries bilaterally. Calcifications noted in placenta.   ANESTHESIA: Spinal INTRAVENOUS FLUIDS: 1000 ml   ESTIMATED BLOOD LOSS: 159 ml URINE OUTPUT:  450 ml SPECIMENS: Placenta sent to pathology COMPLICATIONS: None immediate  PROCEDURE IN DETAIL:  The patient preoperatively received intravenous antibiotics and had sequential compression devices applied to her lower extremities.  She was then taken to the operating room where spinal anesthesia  was administered and was found to be adequate. She was then placed in a dorsal supine position with a leftward tilt, and prepped and draped in a sterile manner.  A foley catheter was placed into her bladder and attached to constant gravity.  After an adequate timeout was performed, a Pfannenstiel skin incision was made with scalpel and carried through to the underlying layer of fascia. The fascia was incised in the midline, and this incision was extended bilaterally using the Mayo scissors.  Kocher clamps were applied to the superior aspect of the fascial incision and the underlying rectus muscles were dissected off bluntly.  A similar process was carried out on the inferior aspect of the fascial incision. The rectus muscles were separated in the midline and the peritoneum was entered bluntly. The Alexis self-retaining retractor was introduced into the abdominal cavity.  Attention was turned to the lower uterine segment where a low transverse hysterotomy was made with a scalpel and extended bilaterally bluntly.  The infant was successfully delivered frank breech, the cord was clamped and cut immediately, and the infant was handed over to the awaiting neonatology team. Uterine massage was then administered, and the placenta delivered intact with a three-vessel cord. The uterus was then cleared of clots and debris.  The hysterotomy was closed with 0 Chromic in a running locked fashion, and an imbricating layer was also placed with 0 Chromic.  Several figure-of-eight 0 Chromic serosal stitches were placed to help with hemostasis. Arista was also placed. The pelvis was cleared of all clot and debris. Hemostasis was confirmed on all surfaces.  The retractor was removed.  The peritoneum and rectus muscles were closed with a 2-0 Vicryl running stitch. The fascia was then closed using 1-0 Vicryl in a running fashion.  The subcutaneous layer was irrigated, reapproximated with 2-0 plain gut interrupted stitches, and the skin  was closed with a 4-0 Vicryl subcuticular stitch. The patient tolerated the procedure well. Sponge, instrument and needle counts were correct x 3.  She was taken to the recovery room in stable condition.   Carrie DeerLaurel S. Earlene PlaterWallace, DO OB/GYN Fellow

## 2019-01-28 NOTE — Progress Notes (Signed)
10 minute deceleration down to 60 at nadir,now resolved with oxygen via NRBM at 15 LPM, fluid bolus, and position change to right side.

## 2019-01-28 NOTE — Progress Notes (Signed)
Patient ID: Carrie Duncan, female   DOB: May 29, 1986, 33 y.o.   MRN: [id number]  Pt having more freq decelerations. BP elevated. Will now a change in FHT's. Feel delivery is best option for pt and infant. Dr Jolayne Panther in agreement. Pt was consent by Dr Jolayne Panther d/t me in the OR. Nursing and Anesthesia aware. To OR when ready.

## 2019-01-28 NOTE — Progress Notes (Signed)
Patient ID: Carrie Duncan, female   DOB: [birth year], 33 y.o.   MRN: [id number] [place]) NOTE  Carrie Duncan is a 33 y.o. [id number] at 33w3dwho is admitted for worsening CHTN, IUGR and abnormal dopplers.    Fetal presentation is breech. Length of Stay:  5  Days  Date of admission:01/23/2019  Subjective: Called to evaluate patient with prolonged decel. She denies any HA, visual changes or abdominal pain. Patient reports the fetal movement as active and no decreased FM Patient reports uterine contraction  activity as none. Patient reports  vaginal bleeding as none. Patient describes fluid per vagina as None.  Vitals:  Blood pressure (!) 144/102, pulse 87, temperature 99.7 F (37.6 C), temperature source Oral, resp. rate 20, height '5\' 2"'$  (1.575 m), weight 67.5 kg, last menstrual period 07/20/2018, SpO2 100 %, currently breastfeeding.   Patient Vitals for the past 24 hrs:  BP Temp Temp src Pulse Resp SpO2  01/27/19 2155 (!) 144/102 99.7 F (37.6 C) Oral 87 20 100 %  01/27/19 1933 (!) 143/94 99.2 F (37.3 C) Oral 73 18 98 %  01/27/19 1537 (!) 133/93 98.6 F (37 C) Oral 85 18 100 %  01/27/19 1200 129/86 99.2 F (37.3 C) Oral 84 18 100 %  01/27/19 1000 (!) 137/92 98.6 F (37 C) Oral 91 18 97 %  01/27/19 0544 (!) 155/106 - - 71 - -  01/27/19 0516 (!) 156/106 98.6 F (37 C) Oral 75 17 98 %   Physical Examination: GENERAL: Well-developed, well-nourished female in no acute distress.  LUNGS: Clear to auscultation bilaterally.  HEART: normal s1 and s2, no MRGs ABDOMEN: Soft, nontender,gravid Ext: equal in size, non tender  Fetal Monitoring:  Baseline 150, min variability, no accels, prolonged 4 minute decel with spontaneous return to baseline. toco negative  Labs:  Results for orders placed or performed during the hospital encounter of 01/23/19 (from the past 24 hour(s))  CBC   Collection Time: 01/27/19  8:56 AM  Result Value Ref Range   WBC 8.0  4.0 - 10.5 K/uL   RBC 4.01 3.87 - 5.11 MIL/uL   Hemoglobin 11.9 (L) 12.0 - 15.0 g/dL   HCT 34.0 (L) 36.0 - 46.0 %   MCV 84.8 80.0 - 100.0 fL   MCH 29.7 26.0 - 34.0 pg   MCHC 35.0 30.0 - 36.0 g/dL   RDW 12.7 11.5 - 15.5 %   Platelets 205 150 - 400 K/uL   nRBC 0.3 (H) 0.0 - 0.2 %  Comprehensive metabolic panel   Collection Time: 01/27/19  8:56 AM  Result Value Ref Range   Sodium 137 135 - 145 mmol/L   Potassium 3.5 3.5 - 5.1 mmol/L   Chloride 107 98 - 111 mmol/L   CO2 21 (L) 22 - 32 mmol/L   Glucose, Bld 83 70 - 99 mg/dL   BUN 6 6 - 20 mg/dL   Creatinine, Ser 0.58 0.44 - 1.00 mg/dL   Calcium 8.6 (L) 8.9 - 10.3 mg/dL   Total Protein 5.5 (L) 6.5 - 8.1 g/dL   Albumin 2.8 (L) 3.5 - 5.0 g/dL   AST 22 15 - 41 U/L   ALT 16 0 - 44 U/L   Alkaline Phosphatase 88 38 - 126 U/L   Total Bilirubin 0.4 0.3 - 1.2 mg/dL   GFR calc non Af Amer >60 >60 mL/min   GFR calc Af Amer >60 >60 mL/min   Anion gap 9 5 - 15  Protein / creatinine ratio,  urine   Collection Time: 01/27/19  9:56 AM  Result Value Ref Range   Creatinine, Urine 76.18 mg/dL   Total Protein, Urine 9 mg/dL   Protein Creatinine Ratio 0.12 0.00 - 0.15 mg/mg[Cre]    Imaging Studies:    [place] Mfm Ob Follow Up  Result Date: 01/23/2019 ----------------------------------------------------------------------  OBSTETRICS REPORT                       (Signed Final 01/23/2019 02:21 pm) ---------------------------------------------------------------------- Patient Info  ID #:       [id number]                          D.O.B.:  Oct 25, 1985 (33 yrs)  Name:       Carrie Duncan                Visit Date: 01/23/2019 12:02 pm ---------------------------------------------------------------------- Performed By  Performed By:     Jeanene Erb BS,      Ref. Address:     [address]                                                             [address]  Attending:        Sander Nephew      [facility]                                       [facility]  Referred By:      [facility] ---------------------------------------------------------------------- Orders   #  Description                          Code         Ordered By   1  [place] MFM OB FOLLOW UP                  24097.35     Sander Nephew   2  [place] MFM UA CORD DOPPLER               32992.42     Sander Nephew  ----------------------------------------------------------------------   #  Order #  Accession #                 Episode #   1  [id number]                  [id number]                  [id number]   2  [id number]                  [id number]                  [id number]  ---------------------------------------------------------------------- Indications   [redacted] weeks gestation of pregnancy                Z3A.26   Hypertension - Chronic/Pre-existing            O10.019   (labetalol and ASA)(Neg Quad)   Poor obstetric history: Previous               O09.299   preeclampsia / eclampsia/gestational HTN   History of sickle cell trait                   Z86.2   Encounter for other antenatal screening        Z36.2   follow-up   Encounter for fetal growth retardation         Z36.4  ---------------------------------------------------------------------- Vital Signs  Weight (lb): 144                               Height:        5'1"  BMI:         27.21 ---------------------------------------------------------------------- Fetal Evaluation  Num Of Fetuses:         1  Fetal Heart Rate(bpm):  136  Cardiac Activity:       Observed  Presentation:           Breech  Placenta:               Fundal  P. Cord Insertion:      Previously Visualized  Amniotic Fluid  AFI FV:      Subjectively low-normal                              Largest Pocket(cm)                              3.3 ----------------------------------------------------------------------  Biometry  BPD:      60.5  mm     G. Age:  24w 5d          1  %    CI:        69.78   %    70 - 86                                                          FL/HC:      19.0   %    18.6 - 20.4  HC:      231.1  mm     G. Age:  25w 1d        < 3  %  HC/AC:      1.17        1.05 - 1.21  AC:      196.8  mm     G. Age:  24w 3d        < 3  %    FL/BPD:     72.4   %    71 - 87  FL:       43.8  mm     G. Age:  24w 3d        < 3  %    FL/AC:      22.3   %    20 - 24  Est. FW:     698  gm      1 lb 9 oz     15  % ---------------------------------------------------------------------- OB History  Gravidity:    5         Term:   3        Prem:   1        SAB:   0  TOP:          0       Ectopic:  0        Living: 3 ---------------------------------------------------------------------- Gestational Age  LMP:           26w 5d        Date:  07/20/18                 EDD:   04/26/19  U/S Today:     24w 5d                                        EDD:   05/10/19  Best:          26w 5d     Det. By:  LMP  (07/20/18)          EDD:   04/26/19 ---------------------------------------------------------------------- Anatomy  Cranium:               Appears normal         Aortic Arch:            Previously seen  Cavum:                 Previously seen        Ductal Arch:            Previously seen  Ventricles:            Previously seen        Diaphragm:              Previously seen  Choroid Plexus:        Previously seen        Stomach:                Appears normal, left                                                                        sided  Cerebellum:            Previously seen  Abdomen:                Appears normal  Posterior Fossa:       Previously seen        Abdominal Wall:         Previously seen  Nuchal Fold:           Previously seen        Cord Vessels:           Previously seen  Face:                  Orbits and profile     Kidneys:                Appear normal                         previously seen  Lips:                   Previously seen        Bladder:                Appears normal  Thoracic:              Appears normal         Spine:                  Previously seen  Heart:                 Appears normal         Upper Extremities:      Previously seen                         (4CH, axis, and                         situs)  RVOT:                  Appears normal         Lower Extremities:      Previously seen  LVOT:                  Appears normal  Other:  Fetus appears to be a female. Heels, 5th digit, and Nasal bone          previously visualized. ---------------------------------------------------------------------- Doppler - Fetal Vessels  Umbilical Artery                                                            ADFV    RDFV                                                              Yes     Yes ---------------------------------------------------------------------- Cervix Uterus Adnexa  Cervix  Not visualized (advanced GA >24wks) ---------------------------------------------------------------------- Comments  I met with Ms. Buchanan and reviewed today's examination  demonstrating reversed EDF in the UA dopplers, the DV  dopplers were within normal limits. In  addition, we discussed  that the fetus has IUGR. We discussed the recommendation  of delivery at 30 weeks with possible inpatient stay at 28  weeks. Today she will have BMZ administered and follow up  in on Friday. The NST demonstrated minimal variability.  Lastly, we noted that her blood pressure was elevated and  will send her to the MAU. ---------------------------------------------------------------------- Impression  IUGR  UA Doppler REDF ---------------------------------------------------------------------- Recommendations  BMZ today  To MAU  Follow up UA DOPPLER's on Friday  Admit at 28 weeks. ----------------------------------------------------------------------               Sander Nephew, MD Electronically Signed Final Report   01/23/2019 02:21 pm  ----------------------------------------------------------------------  [place] Mfm Ua Cord Doppler  Result Date: 01/26/2019 ----------------------------------------------------------------------  OBSTETRICS REPORT                       (Signed Final 01/26/2019 11:58 am) ---------------------------------------------------------------------- Patient Info  ID #:       [id number]                          D.O.B.:  [birth year] (32 yrs)  Name:       Carrie Duncan                Visit Date: 01/26/2019 09:11 am ---------------------------------------------------------------------- Performed By  Performed By:     Corky Crafts             Ref. Address:     [address]                    [place]                                                             [address]  Attending:        Sander Nephew      [facility]                                       [place]  Referred By:      Essie Hart ---------------------------------------------------------------------- Orders   #  Description                          Code         Ordered By   1  [place] MFM UA CORD DOPPLER               [id number]     [place]  ----------------------------------------------------------------------   #  Order #                    Accession #                 Episode #   1  [id number]                  [id number]  [id number]  ---------------------------------------------------------------------- Indications   Maternal care for known or suspected poor      O36.5920   fetal growth, second trimester, not applicable   or unspecified   [redacted] weeks gestation of pregnancy                Z3A.27   Hypertension - Chronic/Pre-existing            O10.019   (labetalol and ASA)(Neg Quad)   Poor obstetric history: Previous               O09.299   preeclampsia / eclampsia/gestational HTN   History of sickle cell trait                   Z86.2  ----------------------------------------------------------------------  Vital Signs  Weight (lb): 148                               Height:        5'1"  BMI:         27.96 ---------------------------------------------------------------------- Fetal Evaluation  Num Of Fetuses:         1  Fetal Heart Rate(bpm):  157  Cardiac Activity:       Observed  Presentation:           Breech  Placenta:               Fundal  Amniotic Fluid  AFI FV:      Within normal limits  AFI Sum(cm)     %Tile       Largest Pocket(cm)  10.02           10          3.04  RUQ(cm)       RLQ(cm)       LUQ(cm)        LLQ(cm)  1.72          3.04          2.5            2.76 ---------------------------------------------------------------------- OB History  Gravidity:    5         Term:   3        Prem:   1        SAB:   0  TOP:          0       Ectopic:  0        Living: 3 ---------------------------------------------------------------------- Gestational Age  LMP:           27w 1d        Date:  07/20/18                 EDD:   04/26/19  Best:          27w 1d     Det. By:  LMP  (07/20/18)          EDD:   04/26/19 ---------------------------------------------------------------------- Doppler - Fetal Vessels  Umbilical Artery                                                            ADFV    RDFV  Yes     Yes ---------------------------------------------------------------------- Cervix Uterus Adnexa  Cervix  Length:            3.3  cm.  Normal appearance by transabdominal scan. ---------------------------------------------------------------------- Impression  Limited exam  Persistent reversed EDF  Ductus venosus appears nml ---------------------------------------------------------------------- Recommendations  Consider 3 x weekly testing with 2x weekly UA Dopplers.  IF NST is non-reassuring consider delivery.  Delivery at 30 weeks ----------------------------------------------------------------------               Sander Nephew, MD Electronically Signed Final  Report   01/26/2019 11:58 am ----------------------------------------------------------------------  [place] Mfm Ua Cord Doppler  Result Date: 01/23/2019 ----------------------------------------------------------------------  OBSTETRICS REPORT                       (Signed Final 01/23/2019 02:21 pm) ---------------------------------------------------------------------- Patient Info  ID #:       [id number]                          D.O.B.:  Feb 13, 1986 (32 yrs)  Name:       Carrie Duncan                Visit Date: 01/23/2019 12:02 pm ---------------------------------------------------------------------- Performed By  Performed By:     Jeanene Erb BS,      Ref. Address:     [address]                                                             [address]  Attending:        Sander Nephew      [facility]                                       [facility]  Referred By:      [facility] ---------------------------------------------------------------------- Orders   #  Description                          Code         Ordered By   1  [place] MFM OB FOLLOW UP                  06237.62     CORENTHIAN                                                        BOOKER   2  [place] MFM UA CORD DOPPLER               76820.02     CORENTHIAN  BOOKER  ----------------------------------------------------------------------   #  Order #                    Accession #                 Episode #   1  [id number]                  [id number]                  [id number]   2  [id number]                  [id number]                  [id number]  ---------------------------------------------------------------------- Indications   [redacted] weeks gestation of pregnancy                Z3A.26   Hypertension - Chronic/Pre-existing            O10.019   (labetalol and ASA)(Neg Quad)   Poor obstetric history: Previous               O09.299   preeclampsia  / eclampsia/gestational HTN   History of sickle cell trait                   Z86.2   Encounter for other antenatal screening        Z36.2   follow-up   Encounter for fetal growth retardation         Z36.4  ---------------------------------------------------------------------- Vital Signs  Weight (lb): 144                               Height:        5'1"  BMI:         27.21 ---------------------------------------------------------------------- Fetal Evaluation  Num Of Fetuses:         1  Fetal Heart Rate(bpm):  136  Cardiac Activity:       Observed  Presentation:           Breech  Placenta:               Fundal  P. Cord Insertion:      Previously Visualized  Amniotic Fluid  AFI FV:      Subjectively low-normal                              Largest Pocket(cm)                              3.3 ---------------------------------------------------------------------- Biometry  BPD:      60.5  mm     G. Age:  24w 5d          1  %    CI:        69.78   %    70 - 86                                                          FL/HC:      19.0   %    18.6 - 20.4  HC:      231.1  mm     G. Age:  25w 1d        < 3  %    HC/AC:      1.17        1.05 - 1.21  AC:      196.8  mm     G. Age:  24w 3d        < 3  %    FL/BPD:     72.4   %    71 - 87  FL:       43.8  mm     G. Age:  24w 3d        < 3  %    FL/AC:      22.3   %    20 - 24  Est. FW:     698  gm      1 lb 9 oz     15  % ---------------------------------------------------------------------- OB History  Gravidity:    5         Term:   3        Prem:   1        SAB:   0  TOP:          0       Ectopic:  0        Living: 3 ---------------------------------------------------------------------- Gestational Age  LMP:           26w 5d        Date:  07/20/18                 EDD:   04/26/19  U/S Today:     24w 5d                                        EDD:   05/10/19  Best:          26w 5d     Det. By:  LMP  (07/20/18)          EDD:   04/26/19  ---------------------------------------------------------------------- Anatomy  Cranium:               Appears normal         Aortic Arch:            Previously seen  Cavum:                 Previously seen        Ductal Arch:            Previously seen  Ventricles:            Previously seen        Diaphragm:              Previously seen  Choroid Plexus:        Previously seen        Stomach:                Appears normal, left  sided  Cerebellum:            Previously seen        Abdomen:                Appears normal  Posterior Fossa:       Previously seen        Abdominal Wall:         Previously seen  Nuchal Fold:           Previously seen        Cord Vessels:           Previously seen  Face:                  Orbits and profile     Kidneys:                Appear normal                         previously seen  Lips:                  Previously seen        Bladder:                Appears normal  Thoracic:              Appears normal         Spine:                  Previously seen  Heart:                 Appears normal         Upper Extremities:      Previously seen                         (4CH, axis, and                         situs)  RVOT:                  Appears normal         Lower Extremities:      Previously seen  LVOT:                  Appears normal  Other:  Fetus appears to be a female. Heels, 5th digit, and Nasal bone          previously visualized. ---------------------------------------------------------------------- Doppler - Fetal Vessels  Umbilical Artery                                                            ADFV    RDFV                                                              Yes     Yes ---------------------------------------------------------------------- Cervix Uterus Adnexa  Cervix  Not visualized (advanced GA >24wks) ---------------------------------------------------------------------- Comments  I met with Ms.  Rings and reviewed today's examination  demonstrating reversed EDF in the UA dopplers, the DV  dopplers were within normal limits. In addition, we discussed  that the fetus has IUGR. We discussed the recommendation  of delivery at 30 weeks with possible inpatient stay at 28  weeks. Today she will have BMZ administered and follow up  in on Friday. The NST demonstrated minimal variability.  Lastly, we noted that her blood pressure was elevated and  will send her to the MAU. ---------------------------------------------------------------------- Impression  IUGR  UA Doppler REDF ---------------------------------------------------------------------- Recommendations  BMZ today  To MAU  Follow up UA DOPPLER's on Friday  Admit at 28 weeks. ----------------------------------------------------------------------               Sander Nephew, MD Electronically Signed Final Report   01/23/2019 02:21 pm ----------------------------------------------------------------------    Medications:  Scheduled . docusate sodium  100 mg Oral Daily  . labetalol  600 mg Oral Q8H  . pantoprazole  40 mg Oral Daily  . polyethylene glycol  17 g Oral BID  . prenatal multivitamin  1 tablet Oral Q1200  . sodium chloride flush  3 mL Intravenous Q12H   I have reviewed the patient's current medications.  ASSESSMENT: [id number] 55w3dEstimated Date of Delivery: 04/26/19  Patient Active Problem List   Diagnosis Date Noted  . Chronic hypertension affecting pregnancy 01/23/2019  . 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 Language barrier 11/21/2018  . Chronic hypertension during pregnancy, antepartum 11/07/2018  . Supervision of high risk pregnancy, antepartum 11/07/2018  . Paronychia of right thumb 10/21/2017  . Complicated migraine 127/74/1287 . 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 Seasonal allergies 02/08/2017  . Low back pain 11/22/2016  . Onychomycosis 10/27/2016  . Essential hypertension   . Hx of preeclampsia, prior  pregnancy, currently pregnant 03/29/2013  . Sickle cell trait ([place] 11/30/2012    PLAN: *Pregnancy: category II tracing but stable.  * IV Fluid bolus and patient placed NPO *FGR with abnormal UA dopplers: stable dopplers 5/1. Continue with continuous EFM. If EFM worsens, will need c-section. Continue with dopplers during the week *NICU consult performed yesterday.  *cHTN: continue labetalol now at 600 TID. Pre-eclampsia surveillance labs negative on 5/2.   *PPx: SCDs *Dispo: see above. No plans for discharge at this time.   Patient declined interpreter  Arieanna Pressey 01/28/2019,2:14 AM

## 2019-01-28 NOTE — Progress Notes (Signed)
Bicitra 15 cc given PO.

## 2019-01-29 LAB — COMPREHENSIVE METABOLIC PANEL WITH GFR
ALT: 16 U/L (ref 0–44)
AST: 30 U/L (ref 15–41)
Albumin: 2.8 g/dL — ABNORMAL LOW (ref 3.5–5.0)
Alkaline Phosphatase: 79 U/L (ref 38–126)
Anion gap: 6 (ref 5–15)
BUN: 6 mg/dL (ref 6–20)
CO2: 24 mmol/L (ref 22–32)
Calcium: 6.5 mg/dL — ABNORMAL LOW (ref 8.9–10.3)
Chloride: 103 mmol/L (ref 98–111)
Creatinine, Ser: 0.57 mg/dL (ref 0.44–1.00)
GFR calc Af Amer: >60 mL/min
GFR calc non Af Amer: >60 mL/min
Glucose, Bld: 93 mg/dL (ref 70–99)
Potassium: 4 mmol/L (ref 3.5–5.1)
Sodium: 133 mmol/L — ABNORMAL LOW (ref 135–145)
Total Bilirubin: 0.5 mg/dL (ref 0.3–1.2)
Total Protein: 5.4 g/dL — ABNORMAL LOW (ref 6.5–8.1)

## 2019-01-29 LAB — CBC
HCT: 33.4 % — ABNORMAL LOW (ref 36.0–46.0)
Hemoglobin: 11.6 g/dL — ABNORMAL LOW (ref 12.0–15.0)
MCH: 29.8 pg (ref 26.0–34.0)
MCHC: 34.7 g/dL (ref 30.0–36.0)
MCV: 85.9 fL (ref 80.0–100.0)
Platelets: 203 10*3/uL (ref 150–400)
RBC: 3.89 MIL/uL (ref 3.87–5.11)
RDW: 12.8 % (ref 11.5–15.5)
WBC: 12.6 10*3/uL — ABNORMAL HIGH (ref 4.0–10.5)
nRBC: 0 % (ref 0.0–0.2)

## 2019-01-29 LAB — MAGNESIUM: Magnesium: 5.9 mg/dL — ABNORMAL HIGH (ref 1.7–2.4)

## 2019-01-29 MED ORDER — ONDANSETRON HCL 4 MG/2ML IJ SOLN: 4.0000 mg | Freq: Three times a day (TID) | INTRAMUSCULAR | Status: DC | PRN

## 2019-01-29 MED ORDER — NALBUPHINE HCL 10 MG/ML IJ SOLN: 5.0000 mg | Freq: Once | INTRAMUSCULAR | Status: DC | PRN

## 2019-01-29 MED ORDER — DIPHENHYDRAMINE HCL 50 MG/ML IJ SOLN: 12.5000 mg | INTRAMUSCULAR | Status: DC | PRN

## 2019-01-29 MED ORDER — DIPHENHYDRAMINE HCL 25 MG PO CAPS: 25.0000 mg | ORAL_CAPSULE | ORAL | Status: DC | PRN

## 2019-01-29 MED ORDER — NALOXONE HCL 4 MG/10ML IJ SOLN
1.0000 ug/kg/h | INTRAVENOUS | Status: DC | PRN
  Filled 2019-01-29: qty 5

## 2019-01-29 MED ORDER — NALBUPHINE HCL 10 MG/ML IJ SOLN: 5.0000 mg | INTRAMUSCULAR | Status: DC | PRN

## 2019-01-29 MED ORDER — SODIUM CHLORIDE 0.9% FLUSH: 3.0000 mL | INTRAVENOUS | Status: DC | PRN

## 2019-01-29 MED ORDER — NALOXONE HCL 0.4 MG/ML IJ SOLN: 0.4000 mg | INTRAMUSCULAR | Status: DC | PRN

## 2019-01-29 NOTE — Progress Notes (Signed)
Subjective: Postpartum Day 1: Cesarean Delivery Patient reports incisional pain and tolerating PO.    Objective: Vital signs in last 24 hours: Temp:  [96 F (35.6 C)-98.3 F (36.8 C)] 98.1 F (36.7 C) (05/04 0740) Pulse Rate:  [55-80] 69 (05/04 0740) Resp:  [0-22] 16 (05/04 0740) BP: (112-166)/(80-110) 127/83 (05/04 0740) SpO2:  [95 %-100 %] 99 % (05/04 0745)  Physical Exam:  General: alert, cooperative and no distress Lochia: appropriate Uterine Fundus: firm Incision: no significant drainage DVT Evaluation: No evidence of DVT seen on physical exam.  Recent Labs    01/27/19 0856 01/29/19 0512  HGB 11.9* 11.6*  HCT 34.0* 33.4*    Assessment/Plan: Status post Cesarean section. Doing well postoperatively.  Continue current care. D/C magnesium at [address] 01/29/2019, 9:05 AM

## 2019-01-29 NOTE — Lactation Note (Signed)
This note was copied from a baby's chart. Lactation Consultation Note Baby 13 hrs old [redacted] wks gestation. Mom's 4th baby. Mom has DEBP at bedside. Encouraged mom to pump every 3 hrs.  Mom demonstrated hand expression. Gave colostrum containers and bottles.  Milk storage discussed. NICU and Lactation brochure given. Encouraged to call for questions.  Patient Name: Carrie Duncan [id number] Date: 01/29/2019 Reason for consult: Initial assessment;Preterm <34wks;NICU baby;Infant < 6lbs   Maternal Data Has patient been taught Hand Expression?: Yes Does the patient have breastfeeding experience prior to this delivery?: Yes  Feeding    LATCH Score       Type of Nipple: Everted at rest and after stimulation           Interventions Interventions: Breast feeding basics reviewed;Breast compression;DEBP;Breast massage;Hand express  Lactation Tools Discussed/Used WIC Program: Yes Pump Review: Setup, frequency, and cleaning;Milk Storage Initiated by:: RN Date initiated:: 01/28/19   Consult Status Consult Status: Follow-up Date: 01/31/19 Follow-up type: In-patient    Derrik Mceachern, Diamond Nickel 01/29/2019, 1:22 AM

## 2019-01-30 NOTE — Progress Notes (Signed)
Subjective: Postpartum Day 2: Cesarean Delivery Patient reports incisional pain, tolerating PO, + flatus and no problems voiding.    Objective: Vital signs in last 24 hours: Temp:  [98.1 F (36.7 C)-99.3 F (37.4 C)] 99.3 F (37.4 C) (05/05 0418) Pulse Rate:  [61-77] 61 (05/05 0418) Resp:  [15-18] 18 (05/05 0506) BP: (105-141)/(72-96) 134/72 (05/05 0418) SpO2:  [98 %-100 %] 99 % (05/05 0418)  Physical Exam:  General: alert Lochia: appropriate Uterine Fundus: firm Incision: dressing clean, dry, intact DVT Evaluation: No evidence of DVT seen on physical exam.  Recent Labs    01/27/19 0856 01/29/19 0512  HGB 11.9* 11.6*  HCT 34.0* 33.4*    Assessment/Plan: Status post Cesarean section. Doing well postoperatively.  Continue current care.  Allie Bossier 01/30/2019, 6:45 AM

## 2019-01-30 NOTE — Lactation Note (Signed)
This note was copied from a baby's chart. Lactation Consultation Note  Patient Name: Boy Connie Murrietta QMVHQ'I Date: 01/30/2019   P4, Baby 48 hours old in NICU.  [redacted]w[redacted]d.  < 2 lbs. Mother breastfed her 3 other children for 2 years each.  Mother declined interpretation. Reviewed hand expression and mother had good flow of colostrum. Encouraged her to hand express and bring colostrum to NICU. Mother has not pumped since Sunday.  Encouraged her to pump q 3 hours. Discussed labels and milk storage and benefits of breastmilk. LC sent [facility] referral for breast pump.      Maternal Data    Feeding Feeding Type: Donor Breast Milk  LATCH Score                   Interventions    Lactation Tools Discussed/Used     Consult Status      Hardie Pulley 01/30/2019, 12:15 PM

## 2019-01-31 ENCOUNTER — Other Ambulatory Visit (HOSPITAL_COMMUNITY): Payer: Medicaid Other

## 2019-01-31 ENCOUNTER — Encounter: Payer: Self-pay | Admitting: *Deleted

## 2019-01-31 ENCOUNTER — Encounter (HOSPITAL_COMMUNITY): Payer: Self-pay | Admitting: Obstetrics & Gynecology

## 2019-01-31 MED ORDER — LABETALOL HCL 300 MG PO TABS: 600.0000 mg | ORAL_TABLET | Freq: Three times a day (TID) | ORAL | 1 refills | Status: DC

## 2019-01-31 MED ORDER — OXYCODONE HCL 5 MG PO TABS: 5.0000 mg | ORAL_TABLET | ORAL | 0 refills | Status: DC | PRN

## 2019-01-31 MED ORDER — IBUPROFEN 800 MG PO TABS: 800.0000 mg | ORAL_TABLET | Freq: Four times a day (QID) | ORAL | 0 refills | Status: DC

## 2019-01-31 NOTE — Progress Notes (Signed)
Teaching complete  Husband with pt  Plan to go to nicu  Prior to leaving

## 2019-01-31 NOTE — Progress Notes (Signed)
Unable to determine if pt had TDAP  Pt left from nicu with husband

## 2019-01-31 NOTE — Discharge Instructions (Signed)

## 2019-01-31 NOTE — Progress Notes (Signed)
FMLA paperwork completed for spouse and faxed to [employer].  Confirmation received.

## 2019-02-08 ENCOUNTER — Encounter: Payer: Medicaid Other | Admitting: Obstetrics and Gynecology

## 2019-02-08 ENCOUNTER — Other Ambulatory Visit: Payer: Medicaid Other

## 2019-02-12 ENCOUNTER — Encounter (HOSPITAL_COMMUNITY): Payer: Self-pay

## 2019-02-12 ENCOUNTER — Other Ambulatory Visit: Payer: Self-pay

## 2019-02-12 ENCOUNTER — Other Ambulatory Visit: Payer: Self-pay | Admitting: Student

## 2019-02-12 ENCOUNTER — Ambulatory Visit (INDEPENDENT_AMBULATORY_CARE_PROVIDER_SITE_OTHER): Payer: Medicaid Other

## 2019-02-12 ENCOUNTER — Inpatient Hospital Stay (HOSPITAL_COMMUNITY)
Admission: AD | Admit: 2019-02-12 | Discharge: 2019-02-12 | Disposition: A | Discharge status: Home / Self Care | Payer: Medicaid Other | Attending: Family Medicine | Admitting: Family Medicine

## 2019-02-12 VITALS — BP 161/112

## 2019-02-12 DIAGNOSIS — O9089 Other complications of the puerperium, not elsewhere classified: Secondary | ICD-10-CM

## 2019-02-12 DIAGNOSIS — Z5189 Encounter for other specified aftercare: Secondary | ICD-10-CM

## 2019-02-12 DIAGNOSIS — I10 Essential (primary) hypertension: Secondary | ICD-10-CM

## 2019-02-12 DIAGNOSIS — O1003 Pre-existing essential hypertension complicating the puerperium: Secondary | ICD-10-CM | POA: Insufficient documentation

## 2019-02-12 DIAGNOSIS — R51 Headache: Secondary | ICD-10-CM

## 2019-02-12 LAB — URINALYSIS, ROUTINE W REFLEX MICROSCOPIC
Bilirubin Urine: NEGATIVE
Glucose, UA: NEGATIVE mg/dL
Hgb urine dipstick: NEGATIVE
Ketones, ur: NEGATIVE mg/dL
Leukocytes,Ua: NEGATIVE
Nitrite: NEGATIVE
Protein, ur: NEGATIVE mg/dL
Specific Gravity, Urine: 1.012 (ref 1.005–1.030)
pH: 6 (ref 5.0–8.0)

## 2019-02-12 MED ORDER — AMLODIPINE BESYLATE 10 MG PO TABS: 10.0000 mg | ORAL_TABLET | Freq: Every day | ORAL | 0 refills | Status: DC

## 2019-02-12 MED ORDER — ACETAMINOPHEN 500 MG PO TABS
1000.0000 mg | ORAL_TABLET | Freq: Once | ORAL | Status: AC
  Administered 2019-02-12: 17:00:00 1000 mg via ORAL
  Filled 2019-02-12: qty 2

## 2019-02-12 MED ORDER — ENALAPRIL MALEATE 5 MG PO TABS: 5.0000 mg | ORAL_TABLET | Freq: Every day | ORAL | 0 refills | Status: DC

## 2019-02-12 MED ORDER — AMLODIPINE BESYLATE 10 MG PO TABS
10.0000 mg | ORAL_TABLET | Freq: Every day | ORAL | Status: DC
  Administered 2019-02-12: 10 mg via ORAL
  Filled 2019-02-12: qty 1

## 2019-02-12 MED ORDER — LABETALOL HCL 100 MG PO TABS
600.0000 mg | ORAL_TABLET | Freq: Once | ORAL | Status: AC
  Administered 2019-02-12: 600 mg via ORAL
  Filled 2019-02-12: qty 6

## 2019-02-12 NOTE — Progress Notes (Signed)
Video Interpreter # [phone] pt here today for BP check and incision check.  BP RA 161/112 .  Pt reports having blurred vision and headaches sometimes.  BP recheck manually LA BP 169/132.  Notified Dr. Alysia Penna who recommended pt to go to MAU for evaluation of elevated BP.  Incision well approximated, no drainage, no odor, and no redness.  Pt stated that she will be able to go to MAU now.  Notified Fleet Contras, MAU charge, the arrival of pt.  Pt did not have any other further questions.   Lambert Keto., RN, BSN 02/12/19

## 2019-02-12 NOTE — Progress Notes (Signed)
Agree with A & P. 
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
## 2019-02-12 NOTE — MAU Note (Signed)
Pt presents to MAU due to elevated blood pressure in office. She delivered c-section on 01/28/2019 at 27 weeks due to blood pressures. She is complaining of headache today as well as visual changes.

## 2019-02-12 NOTE — Discharge Instructions (Signed)

## 2019-02-21 ENCOUNTER — Ambulatory Visit (INDEPENDENT_AMBULATORY_CARE_PROVIDER_SITE_OTHER): Payer: Medicaid Other | Admitting: Obstetrics & Gynecology

## 2019-02-21 ENCOUNTER — Other Ambulatory Visit: Payer: Self-pay

## 2019-02-21 DIAGNOSIS — Z789 Other specified health status: Secondary | ICD-10-CM

## 2019-02-21 DIAGNOSIS — I10 Essential (primary) hypertension: Secondary | ICD-10-CM

## 2019-02-21 DIAGNOSIS — R87612 Low grade squamous intraepithelial lesion on cytologic smear of cervix (LGSIL): Secondary | ICD-10-CM

## 2019-02-21 NOTE — Progress Notes (Signed)
Interpreter ID [phone]  I connected with  Carrie Duncan on 02/21/19 at  3:55 PM EDT by telephone and verified that I am speaking with the correct person using two identifiers.   I discussed the limitations, risks, security and privacy concerns of performing an evaluation and management service by telephone and the availability of in person appointments. I also discussed with the patient that there may be a patient responsible charge related to this service. The patient expressed understanding and agreed to proceed.  [name], CMA 02/21/2019  4:10 PM

## 2019-02-21 NOTE — Progress Notes (Signed)
TELEHEALTH POSTPARTUM VIRTUAL VIDEO VISIT ENCOUNTER NOTE  Provider location: [facility]   I connected with Rosamaria Lints on 02/21/19 at  3:55 PM EDT by telephone Encounter at home and verified that I am speaking with the correct person using two identifiers.    I discussed the limitations, risks, security and privacy concerns of performing an evaluation and management service by telephone and the availability of in person appointments. I also discussed with the patient that there may be a patient responsible charge related to this service. The patient expressed understanding and agreed to proceed.  Appointment Date: 02/21/2019  OBGYN Clinic: [facility]  Chief Complaint: Postpartum Visit  History of Present Illness: Laticha Neri is a 33 y.o.  [id number] being evaluated for postpartum followup.    She is s/p PLTCS at 27 weeks for non-reassing FHR. She is pumping breast milk.  Complains of incisional pain  Vaginal bleeding or discharge: No  Intercourse: No  Contraception: depo provera Mode of feeding infant: Breast PP depression s/s: No .  Any bowel or bladder issues: No   [place] interpretor used for this visit  Review of Systems: Her 12 point review of systems is negative or as noted in the History of Present Illness.  Patient Active Problem List   Diagnosis Date Noted  . 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 Language barrier 11/21/2018  . Essential hypertension   . Sickle cell trait (HCC) 11/30/2012    Medications Mindel Kaelin had no medications administered during this visit. Current Outpatient Medications  Medication Sig Dispense Refill  . amLODipine (NORVASC) 10 MG tablet TAKE 1 TABLET(10 MG) BY MOUTH DAILY 90 tablet 0  . enalapril (VASOTEC) 5 MG tablet TAKE 1 TABLET(5 MG) BY MOUTH DAILY 90 tablet 0  . ibuprofen (ADVIL) 800 MG tablet Take 1 tablet (800 mg total) by mouth every 6 (six) hours. 30 tablet 0  . oxyCODONE (OXY  IR/ROXICODONE) 5 MG immediate release tablet Take 1-2 tablets (5-10 mg total) by mouth every 4 (four) hours as needed for moderate pain. 30 tablet 0  . Prenatal Vit-Fe Fumarate-FA (PRENATAL VITAMINS) 28-0.8 MG TABS Take 1 tablet by mouth daily. 30 tablet 12   No current facility-administered medications for this visit.     Allergies Patient has no known allergies.  Physical Exam:  There were no vitals taken for this visit. She took her BP today and says that it is 124/81. Reviewed from Babyscripts General:  Alert, oriented and cooperative. Patient is in no acute distress.  Mental Status: Normal mood and affect. Normal behavior. Normal judgment and thought content.   Respiratory: Normal respiratory effort noted, no problems with respiration noted  Rest of physical exam deferred due to type of encounter  PP Depression Screening:      Plan: Post partum/postop state- stable HTN- doing well on her current meds LGSIL- will need colpo at next visit Contraception - depo provera at next visit, Rec continue abstinence until then RTC 1 week  I discussed the assessment and treatment plan with the patient. The patient was provided an opportunity to ask questions and all were answered. The patient agreed with the plan and demonstrated an understanding of the instructions.   The patient was advised to call back or seek an in-person evaluation/go to the ED for any concerning postpartum symptoms.  I provided 10 minutes of face-to-face time during this encounter.   Allie Bossier, MD [facility], [facility]

## 2019-02-26 ENCOUNTER — Ambulatory Visit: Payer: Medicaid Other | Admitting: Obstetrics & Gynecology

## 2019-02-26 ENCOUNTER — Ambulatory Visit: Payer: Medicaid Other | Admitting: Advanced Practice Midwife

## 2019-02-28 ENCOUNTER — Ambulatory Visit: Payer: Self-pay

## 2019-02-28 NOTE — Lactation Note (Signed)
This note was copied from a baby's chart. Lactation Consultation Note:  Mother has concerns that she has stopped making milk.  She  Reports to me that her infant became sicker and that she understood that staff told her that they were going to stop giving infant milk for now. Mother at this time stopped pumping.   Mother reports that she pumps 4 times a day. She has a Symphony pump from [facility].  Assist mother with hand expression. No observed drops on the left but a few drops on the rt. Mothers breast are very soft.   Mother advised to start pumping again at least 6-8 times daily.  Advised to hand express.  Suggested pumping when she arrives to the hospital.  Mother is unable to visit long due to 3 other children at home.  Advised to do good breast massage. Mother to follow up with [facility] as needed.   Patient Name: Carrie Duncan [id number] Date: 02/28/2019 Reason for consult: Follow-up assessment   Maternal Data    Feeding Feeding Type: Donor Breast Milk  LATCH Score                   Interventions Interventions: Hand express;DEBP  Lactation Tools Discussed/Used     Consult Status Consult Status: PRN    Michel Bickers 02/28/2019, 3:45 PM

## 2019-03-01 ENCOUNTER — Ambulatory Visit: Payer: Medicaid Other | Admitting: Obstetrics & Gynecology

## 2019-03-02 ENCOUNTER — Other Ambulatory Visit (HOSPITAL_COMMUNITY)
Admission: RE | Admit: 2019-03-02 | Discharge: 2019-03-02 | Disposition: A | Discharge status: Home / Self Care | Payer: Medicaid Other | Source: Ambulatory Visit | Attending: Obstetrics & Gynecology | Admitting: Obstetrics & Gynecology

## 2019-03-02 ENCOUNTER — Ambulatory Visit (INDEPENDENT_AMBULATORY_CARE_PROVIDER_SITE_OTHER): Payer: Medicaid Other | Admitting: Obstetrics and Gynecology

## 2019-03-02 ENCOUNTER — Other Ambulatory Visit: Payer: Self-pay

## 2019-03-02 ENCOUNTER — Encounter: Payer: Self-pay | Admitting: Obstetrics and Gynecology

## 2019-03-02 VITALS — BP 129/92 | HR 89 | Ht 63.0 in | Wt 144.4 lb

## 2019-03-02 DIAGNOSIS — N870 Mild cervical dysplasia: Secondary | ICD-10-CM | POA: Diagnosis not present

## 2019-03-02 DIAGNOSIS — R87612 Low grade squamous intraepithelial lesion on cytologic smear of cervix (LGSIL): Secondary | ICD-10-CM

## 2019-03-02 LAB — POCT PREGNANCY, URINE: Preg Test, Ur: NEGATIVE

## 2019-03-02 NOTE — Progress Notes (Signed)
Colposcopy Procedure Note  Carrie Duncan is a 33 y.o. [id number] here for colposcopy.  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   Swahili translator used   The risks (including infection, bleeding, pain) and benefits of the procedure were explained to the patient and written informed consent was obtained.  The patient was placed in the dorsal lithotomy position. A Graves was speculum inserted in the vagina, and the cervix was visualized.  The cervix was stained with acetic acid and visualized using the colposcope under magnification as well as with a green filter. Findings as below. Cervical biopsies were taken at 2, 4, 11 o'clock. Endocervical curettage then performed in all four quadrants. Small amount of bleeding noted that improved with pressure. Monsel's solution applied with good hemostasis noted. Patient tolerating procedure well.  Findings: acetowhite lesion at 2, 4, 11 o'clock  Impression: CIN 1  Adequate: yes  Specimens: cervical biopsies, endocervical curettge  Condition: Stable  Complications: None  Plan: The patient was advised to call for any fever or for prolonged or severe pain or bleeding. She was advised to use OTC analgesics as needed for mild to moderate pain. She was advised to avoid vaginal intercourse for 48 hours or until the bleeding has completely stopped.  Will base further management on results of biopsy.   Baldemar Lenis, M.D. Attending [facility])

## 2019-03-06 ENCOUNTER — Encounter: Payer: Self-pay | Admitting: *Deleted

## 2019-03-10 ENCOUNTER — Other Ambulatory Visit: Payer: Self-pay | Admitting: Obstetrics & Gynecology

## 2019-03-20 ENCOUNTER — Telehealth: Payer: Self-pay | Admitting: *Deleted

## 2019-03-20 NOTE — Telephone Encounter (Signed)
Called Carrie Duncan per message from Dr.Davis re: results of colposcopy. Called patient with [employer] [phone]. Informed her per Dr.Davis colposcopy showed CIN1 , negative ECC and recommend pap in one year. She voices understanding.  [name],RN

## 2019-03-27 ENCOUNTER — Telehealth: Payer: Medicaid Other | Admitting: Obstetrics and Gynecology

## 2020-08-11 ENCOUNTER — Encounter: Payer: Self-pay | Admitting: Certified Nurse Midwife

## 2020-08-11 ENCOUNTER — Other Ambulatory Visit: Payer: Self-pay

## 2020-08-11 ENCOUNTER — Ambulatory Visit (INDEPENDENT_AMBULATORY_CARE_PROVIDER_SITE_OTHER): Payer: Medicaid Other | Admitting: Certified Nurse Midwife

## 2020-08-11 VITALS — BP 157/109 | HR 97 | Temp 98.4°F | Wt 149.1 lb

## 2020-08-11 DIAGNOSIS — Z01419 Encounter for gynecological examination (general) (routine) without abnormal findings: Secondary | ICD-10-CM

## 2020-08-11 DIAGNOSIS — I10 Essential (primary) hypertension: Secondary | ICD-10-CM

## 2020-08-11 DIAGNOSIS — Z3009 Encounter for other general counseling and advice on contraception: Secondary | ICD-10-CM | POA: Diagnosis not present

## 2020-08-11 DIAGNOSIS — Z30430 Encounter for insertion of intrauterine contraceptive device: Secondary | ICD-10-CM | POA: Diagnosis not present

## 2020-08-11 LAB — POCT PREGNANCY, URINE: Preg Test, Ur: NEGATIVE

## 2020-08-11 MED ORDER — LEVONORGESTREL 19.5 MCG/DAY IU IUD
INTRAUTERINE_SYSTEM | Freq: Once | INTRAUTERINE | Status: AC
  Administered 2020-08-11: 1 via INTRAUTERINE

## 2020-08-11 MED ORDER — ENALAPRIL MALEATE 5 MG PO TABS: 5.0000 mg | ORAL_TABLET | Freq: Every day | ORAL | 2 refills | Status: DC

## 2020-08-11 NOTE — Progress Notes (Signed)
Carrie Duncan is with pt interpreting. Pt understands some [place]

## 2020-08-11 NOTE — Progress Notes (Signed)
GYNECOLOGY ANNUAL PREVENTATIVE CARE ENCOUNTER NOTE  History:     Carrie Duncan is a 34 y.o. [phone] female here for a routine annual gynecologic exam.  Current complaints: request birth control.   Denies abnormal vaginal bleeding, discharge, pelvic pain, problems with intercourse or other gynecologic concerns.    Gynecologic History Patient's last menstrual period was 07/28/2020. Contraception: none- plans initiation of birth control today  Last Pap: 10/2018. Results were: abnormal with negative HPV  Obstetric History OB History  Gravida Para Term Preterm AB Living  5 5 3 2  0 3  SAB TAB Ectopic Multiple Live Births    0   0 4    # Outcome Date GA Lbr Len/2nd Weight Sex Delivery Anes PTL Lv  5 Preterm 01/28/19 [redacted]w[redacted]d    CS-Unspec     4 Term 12/19/14 [redacted]w[redacted]d 07:06 / 00:02 5 lb 8.9 oz (2.52 kg) M Vag-Spont None  LIV  3 Term 04/13/13 [redacted]w[redacted]d 09:32 / 00:35 6 lb 9.1 oz (2.98 kg) M Vag-Spont None  LIV     Birth Comments:    2 Term 01/2010 [redacted]w[redacted]d  4 lb 6.6 oz (2 kg) F Vag-Spont   LIV     Birth Comments: HTN (required meds)  1 Preterm 2009 [redacted]w[redacted]d      N ND     Birth Comments: Induction of labor due to blood pressue -  was born at 6 months    Past Medical History:  Diagnosis Date  . Chronic hypertension   . 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 Gestational HTN   . 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 Pregnancy induced hypertension    Pre-eclampsia    Past Surgical History:  Procedure Laterality Date  . CESAREAN SECTION N/A 01/28/2019   Procedure: CESAREAN SECTION;  Surgeon: 03/30/2019, MD;  Location: [facility] LD [facility];  Service: Obstetrics;  Laterality: N/A;  . MULTIPLE TOOTH EXTRACTIONS    . thumbnail removal Right 09/2017    Current Outpatient Medications on File Prior to Visit  Medication Sig Dispense Refill  . ibuprofen (ADVIL) 800 MG tablet TAKE 1 TABLET(800 MG) BY MOUTH EVERY 6 HOURS (Patient not taking: Reported on 08/11/2020) 30 tablet 0  . oxyCODONE (OXY IR/ROXICODONE) 5 MG  immediate release tablet Take 1-2 tablets (5-10 mg total) by mouth every 4 (four) hours as needed for moderate pain. (Patient not taking: Reported on 03/02/2019) 30 tablet 0  . Prenatal Vit-Fe Fumarate-FA (PRENATAL VITAMINS) 28-0.8 MG TABS Take 1 tablet by mouth daily. (Patient not taking: Reported on 03/02/2019) 30 tablet 12   No current facility-administered medications on file prior to visit.    No Known Allergies  Social History:  reports that she has never smoked. She has never used smokeless tobacco. She reports that she does not drink alcohol and does not use drugs.  Family History  Problem Relation Age of Onset  . Hypertension Father   . Vision loss Father   . Arthritis Mother        knees    The following portions of the patient's history were reviewed and updated as appropriate: allergies, current medications, past family history, past medical history, past social history, past surgical history and problem list.  Review of Systems Pertinent items noted in HPI and remainder of comprehensive ROS otherwise negative.  Physical Exam:  BP (!) 157/109   Pulse 97   Temp 98.4 F (36.9 C)   Wt 149 lb 1.6 oz (67.6 kg)   LMP 07/28/2020   BMI  26.41 kg/m  CONSTITUTIONAL: Well-developed, well-nourished female in no acute distress.  HENT:  Normocephalic, atraumatic, External right and left ear normal. Oropharynx is clear and moist EYES: Conjunctivae and EOM are normal. Pupils are equal, round, and reactive to light. NECK: Normal range of motion, supple, no masses.  Normal thyroid.  SKIN: Skin is warm and dry. No rash noted. Not diaphoretic. No erythema. No pallor. MUSCULOSKELETAL: Normal range of motion. No tenderness.  No cyanosis, clubbing, or edema.  2+ distal pulses. NEUROLOGIC: Alert and oriented to person, place, and time. Normal reflexes, muscle tone coordination.  PSYCHIATRIC: Normal mood and affect. Normal behavior. Normal judgment and thought content. CARDIOVASCULAR: Normal  heart rate noted, regular rhythm RESPIRATORY: Clear to auscultation bilaterally. Effort and breath sounds normal, no problems with respiration noted. BREASTS: Symmetric in size. No masses, tenderness, skin changes, nipple drainage, or lymphadenopathy bilaterally. Performed in the presence of a chaperone. ABDOMEN: Soft, no distention noted.  No tenderness, rebound or guarding.  PELVIC: Normal appearing external genitalia and urethral meatus; normal appearing vaginal mucosa and cervix.  No abnormal discharge noted. Normal uterine size, no other palpable masses, no uterine or adnexal tenderness.  Performed in the presence of a chaperone.  PROCEDURES:  IUD Insertion Procedure Note Patient identified, informed consent performed.  Discussed risks of irregular bleeding, cramping, infection, malpositioning or misplacement of the IUD outside the uterus which may require further procedure such as laparoscopy. Time out was performed.  Urine pregnancy test negative.  Speculum placed in the vagina.  Cervix visualized.  Cleaned with Betadine x 2.  Grasped anteriorly with a single tooth tenaculum.  Uterus sounded to 9 cm.  Liletta IUD placed per manufacturer's recommendations.  Strings trimmed to 3-4 cm. Tenaculum was removed, good hemostasis noted.  Patient tolerated procedure well.   Patient was given post-procedure instructions.  She was advised to be have backup contraception for one week.  Patient was also asked to check IUD strings periodically and follow up in 4 weeks for IUD check.    Assessment and Plan:    1. Birth control counseling - Educated and discussed birth control options in detail with patient, due to hypertension limited to IUD and Nexplanon  - Patient had nexplanon in past and got second one taken out due to ringing in ears and headaches  - Discussed IUD in detail with side effects, placement procedure and risks - patient wants to proceed with IUD today   2. Encounter for IUD insertion -  UPT negative  - See above procedure note  - Pregnancy, urine POC - levonorgestrel (LILETTA) 19.5 MCG/DAY IUD  3. Chronic hypertension - patient reports that she stopped taking medication  - educated and discussed importance of taking medication and increased risk without medication  - enalapril (VASOTEC) 5 MG tablet; Take 1 tablet (5 mg total) by mouth daily.  Dispense: 90 tablet; Refill: 2  4. Women's annual routine gynecological examination - needs pap done at IUD string check appointment    Routine preventative health maintenance measures emphasized. Please refer to After Visit Summary for other counseling recommendations.       Sharyon Cable, CNM [facility], [facility]

## 2020-08-11 NOTE — Patient Instructions (Signed)

## 2020-08-13 ENCOUNTER — Encounter: Payer: Self-pay | Admitting: *Deleted

## 2020-09-12 ENCOUNTER — Ambulatory Visit (INDEPENDENT_AMBULATORY_CARE_PROVIDER_SITE_OTHER): Payer: Medicaid Other | Admitting: Certified Nurse Midwife

## 2020-09-12 ENCOUNTER — Other Ambulatory Visit (HOSPITAL_COMMUNITY)
Admission: RE | Admit: 2020-09-12 | Discharge: 2020-09-12 | Disposition: A | Discharge status: Home / Self Care | Payer: Medicaid Other | Source: Ambulatory Visit | Attending: Certified Nurse Midwife | Admitting: Certified Nurse Midwife

## 2020-09-12 ENCOUNTER — Other Ambulatory Visit: Payer: Self-pay

## 2020-09-12 ENCOUNTER — Encounter: Payer: Self-pay | Admitting: Certified Nurse Midwife

## 2020-09-12 VITALS — BP 152/93 | HR 73 | Ht 63.0 in | Wt 143.8 lb

## 2020-09-12 DIAGNOSIS — Z975 Presence of (intrauterine) contraceptive device: Secondary | ICD-10-CM

## 2020-09-12 DIAGNOSIS — R87612 Low grade squamous intraepithelial lesion on cytologic smear of cervix (LGSIL): Secondary | ICD-10-CM

## 2020-09-12 NOTE — Progress Notes (Signed)
GYNECOLOGY OFFICE VISIT NOTE  History:  34 y.o. [id number] here today for IUD string check. Liletta IUD was placed on 08/11/20. She denies any abnormal vaginal discharge, pelvic pain or other concerns. Having no spotting/bleeding. Has not had IC since placement.   Past Medical History:  Diagnosis Date  . Chronic hypertension   . 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 Gestational HTN   . 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 Pregnancy induced hypertension    Pre-eclampsia    Past Surgical History:  Procedure Laterality Date  . CESAREAN SECTION N/A 01/28/2019   Procedure: CESAREAN SECTION;  Surgeon: Hermina Staggers, MD;  Location: [facility];  Service: Obstetrics;  Laterality: N/A;  . MULTIPLE TOOTH EXTRACTIONS    . thumbnail removal Right 09/2017    The following portions of the patient's history were reviewed and updated as appropriate: allergies, current medications, past family history, past medical history, past social history, past surgical history and problem list.   Review of Systems:  Pertinent items noted in HPI and remainder of comprehensive ROS otherwise negative.  Objective:  Physical Exam BP (!) 152/93 (BP Location: Left Arm)   Pulse 73   Ht 5\' 3"  (1.6 m)   Wt 143 lb 12.8 oz (65.2 kg)   LMP 08/23/2020   BMI 25.47 kg/m  CONSTITUTIONAL: Well-developed, well-nourished female in no acute distress.  HENT:  Normocephalic, atraumatic.  SKIN: Skin is warm and dry. No rash noted. Not diaphoretic. No erythema. No pallor. NEUROLOGIC: Alert and oriented to person, place, and time.  PSYCHIATRIC: Normal mood and affect. Normal behavior. Normal judgment and thought content. CARDIOVASCULAR: Normal heart rate noted RESPIRATORY: Effort and rate normal ABDOMEN: Soft, no distention noted.   PELVIC: Normal appearing external genitalia; normal appearing vaginal mucosa and cervix. IUD string seen. Pap smear obtained. No abnormal discharge noted.    Assessment & Plan:  1. LGSIL on Pap smear  of cervix - Abnormal pap 10/2018, repeat today. Discussed with patient will call if abnormal, educated on the need for colposcopy if abnormal  - Cytology - PAP( [place])  2. IUD (intrauterine device) in place - IUD in place, no concerns or complaints    Please refer to After Visit Summary for other counseling recommendations.    11/2018, CNM 09/12/2020 11:09 AM

## 2020-09-16 LAB — CYTOLOGY - PAP
Comment: NEGATIVE
Diagnosis: UNDETERMINED — AB
High risk HPV: NEGATIVE

## 2020-09-22 ENCOUNTER — Telehealth: Payer: Self-pay | Admitting: *Deleted

## 2020-09-22 NOTE — Telephone Encounter (Addendum)
-----   Message from Sharyon Cable, CNM sent at 09/18/2020  8:21 PM EST ----- Please call patient and notify of abnormal pap smear. Due to abnormal pap smear 2 years in the row patient needs to have colposcopy. Please schedule that with one of the MDs.   Thank you,  Steward Drone CNM   12/27 1052 Called pt w/[facility] interpreter # [phone]. She was informed of Pap results as well as recommended plan of care for Colposcopy. Pt stated that she does not understand why this keeps happening. I stated that the abnormal cells are a lower level of abnormality as compared to her Pap from May 2020 and this is good news. It is still recommended for her to have repeat Colpo since the Pap was abnormal 2 years in a row. She was informed that she will be notified of appointment date and time. Pt agreed to plan of care and voiced understanding.

## 2020-10-27 ENCOUNTER — Ambulatory Visit: Payer: Medicaid Other | Admitting: Obstetrics and Gynecology

## 2020-11-24 ENCOUNTER — Other Ambulatory Visit: Payer: Self-pay

## 2020-11-24 ENCOUNTER — Encounter: Payer: Self-pay | Admitting: Obstetrics and Gynecology

## 2020-11-24 ENCOUNTER — Ambulatory Visit (INDEPENDENT_AMBULATORY_CARE_PROVIDER_SITE_OTHER): Payer: Medicaid Other | Admitting: Obstetrics and Gynecology

## 2020-11-24 VITALS — BP 150/102 | HR 90 | Ht 63.0 in | Wt 144.9 lb

## 2020-11-24 DIAGNOSIS — L7682 Other postprocedural complications of skin and subcutaneous tissue: Secondary | ICD-10-CM | POA: Diagnosis not present

## 2020-11-24 DIAGNOSIS — I10 Essential (primary) hypertension: Secondary | ICD-10-CM | POA: Diagnosis not present

## 2020-11-24 MED ORDER — ENALAPRIL MALEATE 5 MG PO TABS: 5.0000 mg | ORAL_TABLET | Freq: Every day | ORAL | 2 refills | Status: DC

## 2020-11-24 NOTE — Progress Notes (Signed)
Pt has appointment with [facility]  On 01/23/21 @ 8:50am for managing her Hypertension.

## 2020-11-24 NOTE — Progress Notes (Signed)
GYNECOLOGY OFFICE FOLLOW UP NOTE  History:  35 y.o. [id number] here today for follow up for pain from c-section in 2020. She reports that she has had ongoing pain at incision site. Reports it is about 1x per week, feels it more with heavy activity, improves with rest. Also worse with stress. Feels like she never recovered well from CS. Rates it 9/10 when it happens, takes tylenol and rests and feels better. Got worse last week which is why she decided to call.  Does not take her anti-hypertensive daily.  Past Medical History:  Diagnosis Date  . Chronic hypertension   . 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 Gestational HTN   . 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 Pregnancy induced hypertension    Pre-eclampsia    Past Surgical History:  Procedure Laterality Date  . CESAREAN SECTION N/A 01/28/2019   Procedure: CESAREAN SECTION;  Surgeon: Hermina Staggers, MD;  Location: [facility];  Service: Obstetrics;  Laterality: N/A;  . MULTIPLE TOOTH EXTRACTIONS    . thumbnail removal Right 09/2017     Current Outpatient Medications:  .  enalapril (VASOTEC) 5 MG tablet, Take 1 tablet (5 mg total) by mouth daily., Disp: 90 tablet, Rfl: 2 .  ibuprofen (ADVIL) 800 MG tablet, TAKE 1 TABLET(800 MG) BY MOUTH EVERY 6 HOURS (Patient not taking: Reported on 08/11/2020), Disp: 30 tablet, Rfl: 0 .  oxyCODONE (OXY IR/ROXICODONE) 5 MG immediate release tablet, Take 1-2 tablets (5-10 mg total) by mouth every 4 (four) hours as needed for moderate pain. (Patient not taking: Reported on 03/02/2019), Disp: 30 tablet, Rfl: 0 .  Prenatal Vit-Fe Fumarate-FA (PRENATAL VITAMINS) 28-0.8 MG TABS, Take 1 tablet by mouth daily. (Patient not taking: Reported on 03/02/2019), Disp: 30 tablet, Rfl: 12  The following portions of the patient's history were reviewed and updated as appropriate: allergies, current medications, past family history, past medical history, past social history, past surgical history and problem list.   Review of  Systems:  Pertinent items noted in HPI and remainder of comprehensive ROS otherwise negative.   Objective:  Physical Exam BP (!) 150/102 (BP Location: Left Arm)   Pulse 90   Ht 5\' 3"  (1.6 m)   Wt 144 lb 14.4 oz (65.7 kg)   LMP 11/21/2020 (Exact Date)   BMI 25.67 kg/m  CONSTITUTIONAL: Well-developed, well-nourished female in no acute distress.  HENT:  Normocephalic, atraumatic. External right and left ear normal. Oropharynx is clear and moist EYES: Conjunctivae and EOM are normal. Pupils are equal, round, and reactive to light. No scleral icterus.  NECK: Normal range of motion, supple, no masses SKIN: Skin is warm and dry. No rash noted. Not diaphoretic. No erythema. No pallor. NEUROLOGIC: Alert and oriented to person, place, and time. Normal reflexes, muscle tone coordination. No cranial nerve deficit noted. PSYCHIATRIC: Normal mood and affect. Normal behavior. Normal judgment and thought content. CARDIOVASCULAR: Normal heart rate noted RESPIRATORY: Effort normal, no problems with respiration noted ABDOMEN: Soft, no distention noted. Well healed pfannenstiel, soft, no induration, abscess, minimal tenderness on exam PELVIC: deferred MUSCULOSKELETAL: Normal range of motion. No edema noted.  Labs and Imaging No results found.  Assessment & Plan:  1. Primary hypertension Encouraged her to take meds, emphasized importance of good blood pressure control - Ambulatory referral to [facility]  2. Incisional pain - No acute source for pain - encouraged ibuprofen/tylenol prn - may benefit from physical therapy on abdomen for strengthen area - Ambulatory referral to Physical Therapy  3. Chronic  hypertension - enalapril (VASOTEC) 5 MG tablet; Take 1 tablet (5 mg total) by mouth daily.  Dispense: 90 tablet; Refill: 2   Routine preventative health maintenance measures emphasized. Please refer to After Visit Summary for other counseling recommendations.   Return if symptoms worsen  or fail to improve.  Total face-to-face time with patient: 22 minutes. Over 50% of encounter was spent on counseling and coordination of care.  Baldemar Lenis, MD, [facility] Attending Center for [employer] [facility])

## 2021-01-03 ENCOUNTER — Encounter: Payer: Self-pay | Admitting: *Deleted
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
## 2021-01-23 ENCOUNTER — Ambulatory Visit (INDEPENDENT_AMBULATORY_CARE_PROVIDER_SITE_OTHER): Payer: Medicaid Other | Admitting: Primary Care

## 2021-01-23 ENCOUNTER — Encounter (INDEPENDENT_AMBULATORY_CARE_PROVIDER_SITE_OTHER): Payer: Self-pay | Admitting: Primary Care

## 2021-01-23 ENCOUNTER — Other Ambulatory Visit: Payer: Self-pay

## 2021-01-23 VITALS — BP 144/90 | HR 72 | Resp 16 | Ht 61.0 in | Wt 144.6 lb

## 2021-01-23 DIAGNOSIS — Z789 Other specified health status: Secondary | ICD-10-CM | POA: Diagnosis not present

## 2021-01-23 DIAGNOSIS — I10 Essential (primary) hypertension: Secondary | ICD-10-CM | POA: Diagnosis not present

## 2021-01-23 DIAGNOSIS — Z131 Encounter for screening for diabetes mellitus: Secondary | ICD-10-CM | POA: Diagnosis not present

## 2021-01-23 DIAGNOSIS — Z7689 Persons encountering health services in other specified circumstances: Secondary | ICD-10-CM

## 2021-01-23 LAB — POCT GLYCOSYLATED HEMOGLOBIN (HGB A1C): Hemoglobin A1C: 4.9 % (ref 4.0–5.6)

## 2021-01-23 MED ORDER — AMLODIPINE BESYLATE 10 MG PO TABS: 10.0000 mg | ORAL_TABLET | Freq: Every day | ORAL | 3 refills | Status: DC

## 2021-01-23 NOTE — Patient Instructions (Signed)
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
## 2021-02-27 ENCOUNTER — Encounter (INDEPENDENT_AMBULATORY_CARE_PROVIDER_SITE_OTHER): Payer: Self-pay | Admitting: Primary Care

## 2021-02-27 ENCOUNTER — Other Ambulatory Visit: Payer: Self-pay

## 2021-02-27 ENCOUNTER — Ambulatory Visit (INDEPENDENT_AMBULATORY_CARE_PROVIDER_SITE_OTHER): Payer: Medicaid Other | Admitting: Primary Care

## 2021-02-27 VITALS — BP 124/85 | HR 71 | Temp 97.5°F | Ht 61.0 in | Wt 140.4 lb

## 2021-02-27 DIAGNOSIS — Z713 Dietary counseling and surveillance: Secondary | ICD-10-CM

## 2021-02-27 DIAGNOSIS — I10 Essential (primary) hypertension: Secondary | ICD-10-CM
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
## 2021-08-31 ENCOUNTER — Ambulatory Visit (INDEPENDENT_AMBULATORY_CARE_PROVIDER_SITE_OTHER): Payer: Medicaid Other | Admitting: Primary Care

## 2021-10-09 ENCOUNTER — Encounter (HOSPITAL_COMMUNITY): Payer: Self-pay | Admitting: *Deleted

## 2021-10-09 ENCOUNTER — Ambulatory Visit (HOSPITAL_COMMUNITY)
Admission: EM | Admit: 2021-10-09 | Discharge: 2021-10-09 | Disposition: A | Discharge status: Home / Self Care | Payer: Medicaid Other | Attending: Urgent Care | Admitting: Urgent Care

## 2021-10-09 ENCOUNTER — Other Ambulatory Visit: Payer: Self-pay

## 2021-10-09 DIAGNOSIS — B349 Viral infection, unspecified: Secondary | ICD-10-CM | POA: Diagnosis not present

## 2021-10-09 DIAGNOSIS — D573 Sickle-cell trait: Secondary | ICD-10-CM | POA: Diagnosis not present

## 2021-10-09 DIAGNOSIS — Y939 Activity, unspecified: Secondary | ICD-10-CM | POA: Insufficient documentation

## 2021-10-09 DIAGNOSIS — M79606 Pain in leg, unspecified: Secondary | ICD-10-CM | POA: Diagnosis not present

## 2021-10-09 DIAGNOSIS — R109 Unspecified abdominal pain: Secondary | ICD-10-CM

## 2021-10-09 DIAGNOSIS — R1010 Upper abdominal pain, unspecified: Secondary | ICD-10-CM | POA: Diagnosis not present

## 2021-10-09 DIAGNOSIS — M545 Low back pain, unspecified: Secondary | ICD-10-CM | POA: Diagnosis present

## 2021-10-09 DIAGNOSIS — R052 Subacute cough: Secondary | ICD-10-CM | POA: Diagnosis not present

## 2021-10-09 DIAGNOSIS — R52 Pain, unspecified: Secondary | ICD-10-CM | POA: Diagnosis present

## 2021-10-09 DIAGNOSIS — Z20822 Contact with and (suspected) exposure to covid-19: Secondary | ICD-10-CM | POA: Diagnosis not present

## 2021-10-09 LAB — POCT URINALYSIS DIPSTICK, ED / UC
Bilirubin Urine: NEGATIVE
Glucose, UA: NEGATIVE mg/dL
Hgb urine dipstick: NEGATIVE
Ketones, ur: NEGATIVE mg/dL
Leukocytes,Ua: NEGATIVE
Nitrite: NEGATIVE
Protein, ur: NEGATIVE mg/dL
Specific Gravity, Urine: 1.015 (ref 1.005–1.030)
Urobilinogen, UA: 0.2 mg/dL (ref 0.0–1.0)
pH: 6 (ref 5.0–8.0)

## 2021-10-09 MED ORDER — BENZONATATE 100 MG PO CAPS
100.0000 mg | ORAL_CAPSULE | Freq: Three times a day (TID) | ORAL | 0 refills | Status: DC | PRN
Start: 2021-10-09 — End: 2021-10-26

## 2021-10-09 MED ORDER — PROMETHAZINE-DM 6.25-15 MG/5ML PO SYRP: 5.0000 mL | ORAL_SOLUTION | Freq: Every evening | ORAL | 0 refills | Status: DC | PRN

## 2021-10-09 MED ORDER — TIZANIDINE HCL 4 MG PO TABS
4.0000 mg | ORAL_TABLET | Freq: Every day | ORAL | 0 refills | Status: DC
Start: 2021-10-09 — End: 2023-05-25

## 2021-10-09 MED ORDER — NAPROXEN 500 MG PO TABS: 500.0000 mg | ORAL_TABLET | Freq: Two times a day (BID) | ORAL | 0 refills | Status: DC
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
## 2021-10-10 LAB — SARS CORONAVIRUS 2 (TAT 6-24 HRS): SARS Coronavirus 2: NEGATIVE

## 2021-10-26 ENCOUNTER — Telehealth: Payer: Self-pay

## 2021-10-26 ENCOUNTER — Other Ambulatory Visit: Payer: Self-pay

## 2021-10-26 ENCOUNTER — Encounter (INDEPENDENT_AMBULATORY_CARE_PROVIDER_SITE_OTHER): Payer: Self-pay | Admitting: Primary Care

## 2021-10-26 ENCOUNTER — Other Ambulatory Visit (HOSPITAL_COMMUNITY)
Admission: RE | Admit: 2021-10-26 | Discharge: 2021-10-26 | Disposition: A | Discharge status: Home / Self Care | Payer: Medicaid Other | Source: Ambulatory Visit | Attending: Primary Care | Admitting: Primary Care

## 2021-10-26 ENCOUNTER — Ambulatory Visit (INDEPENDENT_AMBULATORY_CARE_PROVIDER_SITE_OTHER): Payer: Medicaid Other | Admitting: Primary Care

## 2021-10-26 VITALS — BP 147/96 | HR 69 | Temp 97.9°F | Ht 61.0 in | Wt 137.8 lb

## 2021-10-26 DIAGNOSIS — Z202 Contact with and (suspected) exposure to infections with a predominantly sexual mode of transmission: Secondary | ICD-10-CM | POA: Diagnosis not present

## 2021-10-26 DIAGNOSIS — Z23 Encounter for immunization: Secondary | ICD-10-CM

## 2021-10-26 DIAGNOSIS — I10 Essential (primary) hypertension: Secondary | ICD-10-CM | POA: Diagnosis not present

## 2021-10-26 DIAGNOSIS — F320 Major depressive disorder, single episode, mild: Secondary | ICD-10-CM | POA: Diagnosis not present

## 2021-10-26 MED ORDER — AMLODIPINE BESYLATE 10 MG PO TABS: 10.0000 mg | ORAL_TABLET | Freq: Every day | ORAL | 3 refills | Status: DC

## 2021-10-26 MED ORDER — ENALAPRIL MALEATE 5 MG PO TABS: 5.0000 mg | ORAL_TABLET | Freq: Every day | ORAL | 2 refills | Status: DC
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
## 2021-10-28 LAB — CERVICOVAGINAL ANCILLARY ONLY
Bacterial Vaginitis (gardnerella): NEGATIVE
Candida Glabrata: NEGATIVE
Candida Vaginitis: NEGATIVE
Chlamydia: NEGATIVE
Comment: NEGATIVE
Comment: NEGATIVE
Comment: NEGATIVE
Comment: NEGATIVE
Comment: NEGATIVE
Comment: NORMAL
Neisseria Gonorrhea: NEGATIVE
Trichomonas: NEGATIVE

## 2021-10-30 ENCOUNTER — Telehealth (INDEPENDENT_AMBULATORY_CARE_PROVIDER_SITE_OTHER): Payer: Self-pay

## 2021-10-30 NOTE — Telephone Encounter (Signed)
-----   Message from Grayce Sessions, NP sent at 10/30/2021  8:45 AM EST ----- STD screening is negative

## 2021-10-30 NOTE — Telephone Encounter (Signed)
Patient given STD/infection results after verifying date of birth. She is aware that screening was negative. Maryjean Morn, CMA

## 2021-11-12 ENCOUNTER — Institutional Professional Consult (permissible substitution) (INDEPENDENT_AMBULATORY_CARE_PROVIDER_SITE_OTHER): Payer: Medicaid Other | Admitting: Clinical

## 2021-11-12 ENCOUNTER — Ambulatory Visit (INDEPENDENT_AMBULATORY_CARE_PROVIDER_SITE_OTHER): Payer: Medicaid Other | Admitting: Primary Care

## 2021-11-30 NOTE — Telephone Encounter (Signed)
I contacted pt to reschedule previous appt for 12/17/21.  ?

## 2021-12-17 ENCOUNTER — Other Ambulatory Visit: Payer: Self-pay

## 2021-12-17 ENCOUNTER — Encounter (INDEPENDENT_AMBULATORY_CARE_PROVIDER_SITE_OTHER): Payer: Self-pay | Admitting: Clinical

## 2021-12-17 ENCOUNTER — Ambulatory Visit (INDEPENDENT_AMBULATORY_CARE_PROVIDER_SITE_OTHER): Payer: Medicaid Other | Admitting: Clinical

## 2021-12-17 DIAGNOSIS — F331 Major depressive disorder, recurrent, moderate: Secondary | ICD-10-CM

## 2021-12-17 NOTE — BH Specialist Note (Signed)
Integrated Behavioral Health Initial In-Person Visit ? ?MRN: [id number] ?Name: Carrie Duncan ? ?Number of Integrated Behavioral Health Clinician visits: 1- Initial Visit ? ?Session Start time: 1110 ?   ?Session End time: 1225 ? ?Total time in minutes: 75 ? ? ?Types of Service: Individual psychotherapy ? ?Interpretor:Yes.   Interpretor Name and Language: Delon [place] #[id number]  ?LCSW and pt waited 25 mins for an interpreter to come on the line. ? ? ? Warm Hand Off Completed. ?  ? ?  ? ? ?Subjective: ?Carrie Duncan is a 36 y.o. female accompanied by  self ?Patient was referred by Gwinda Passe, NP for depression. ?Patient reports the following symptoms/concerns: Reports feeling depressed, trouble sleeping, decreased energy, self-esteem disturbance, and trouble concentrating. Reports that she has been having problems in her marriage. Reports that her spouse has been cheating on her. Reports that she does not want to be with him but people frequently tell her to stay with him. Reports that they have four kids and he does not help with their children as much. Reports that he frequently goes out with friends and stays out late.  ?Duration of problem: 1+ year; Severity of problem: moderate ? ?Objective: ?Mood: Depressed and Affect: Appropriate ?Risk of harm to self or others: No plan to harm self or others ? ?Life Context: ?Family and Social: Pt is married with four children.  ?School/Work: Pt is employed.  ?Self-Care: Pt has limited self-care. Pt talks to friends for support.  ?Life Changes: Pt's spouse has been cheating on her. ? ?Patient and/or Family's Strengths/Protective Factors: ?Sense of purpose and Communication skills ? ?Goals Addressed: ?Patient will: ?Reduce symptoms of: depression ?Increase knowledge and/or ability of: stress reduction  ?Demonstrate ability to: Increase healthy adjustment to current life circumstances and Increase adequate support systems for patient/family ? ?Progress towards  Goals: ?Ongoing ? ?Interventions: ?Interventions utilized: Copywriter, advertising, CBT Cognitive Behavioral Therapy, and Supportive Counseling  ?Standardized Assessments completed: PHQ 9 ? ?  12/17/2021  ? 11:31 AM 10/26/2021  ?  2:40 PM 02/27/2021  ?  9:54 AM 01/23/2021  ?  9:11 AM 11/26/2020  ?  8:16 AM  ?Depression screen PHQ 2/9  ?Decreased Interest 2 1 1  0 0  ?Down, Depressed, Hopeless 2 1 0 0 0  ?PHQ - 2 Score 4 2 1  0 0  ?Altered sleeping 2 1   1   ?Tired, decreased energy 1 1   0  ?Change in appetite 2 1   0  ?Feeling bad or failure about yourself  1 2   0  ?Trouble concentrating 1 2   0  ?Moving slowly or fidgety/restless 0 0   0  ?Suicidal thoughts 0 0   0  ?PHQ-9 Score 11 9   1   ?Difficult doing work/chores  Somewhat difficult     ?  ?Patient and/or Family Response: Pt receptive to tx. Pt receptive to psychoeducation on depression. Pt receptive to cognitive restructuring and processing her marriage issues. Pt receptive to putting herself first.  ? ?Patient Centered Plan: ?Patient is on the following Treatment Plan(s):  Depression and stress ? ?Assessment: ?Denies SI/HI. Denies auditory/visual hallucinations. Patient currently experiencing depression associated with marriage issues. Pt appears to frequently listen to others who tell her to stay with her husband. Pt appears to communicate with her spouse but they continue having problems. Pt desires to leave her spouse and wants to focus more on herself. Pt has self-esteem problems. ?  ?Patient may benefit from increased daily self-care. LCSW provided  psychoeducation on depression. LCSW utilized cognitive restructuring to decrease negative thoughts. LCSW encouraged pt to increase daily self-care. LCSW will fu with pt. ? ?Plan: ?Follow up with behavioral health clinician on : 01/07/22 ?Behavioral recommendations: Increase daily self-care.  ?Referral(s): Integrated [employer] (In Clinic) ?"From scale of 1-10, how likely are you to follow  plan?": 10 ? ?Josselin Gaulin C Ziya Coonrod, LCSW ? ? ? ? ? ? ? ? ?

## 2022-01-07 ENCOUNTER — Ambulatory Visit (INDEPENDENT_AMBULATORY_CARE_PROVIDER_SITE_OTHER): Payer: Medicaid Other | Admitting: Clinical

## 2022-01-07 ENCOUNTER — Encounter (INDEPENDENT_AMBULATORY_CARE_PROVIDER_SITE_OTHER): Payer: Self-pay | Admitting: Primary Care

## 2022-01-07 DIAGNOSIS — F331 Major depressive disorder, recurrent, moderate: Secondary | ICD-10-CM | POA: Diagnosis not present

## 2022-01-07 NOTE — BH Specialist Note (Signed)
Integrated Behavioral Health Follow Up In-Person Visit ? ?MRN: [id number] ?Name: Carrie Duncan ? ?Number of Integrated Behavioral Health Clinician visits: 1- Initial Visit ? ?Session Start time: 1110 ?  ?Session End time: 1225 ? ?Total time in minutes: 75 ? ? ?Types of Service: Individual psychotherapy ? ?Interpretor:Yes.   Interpretor Name and Language: Sharlet Salina [phone] [facility]) ?  ?Subjective: ?Carrie Duncan is a 36 y.o. female accompanied by  self ?Patient was referred by Gwinda Passe, NP for depression. ?Patient reports the following symptoms/concerns: Reports feeling down at times but she has noticed an improvement. Reports that she feels overwhelmed. Reports that she has been trying to improve her self-esteem by spending time with herself.  ?Duration of problem: 1+ year; Severity of problem: moderate ? ?Objective: ?Mood: Depressed and Affect: Appropriate ?Risk of harm to self or others: No plan to harm self or others ? ?Life Context: ?Family and Social: Pt is married with four children.  ?School/Work: Pt is employed.  ?Self-Care: Pt has been utilizing meditation, going for walks, and spending time in the park.   ?Life Changes: Pt's spouse has been cheating on her. ? ?Patient and/or Family's Strengths/Protective Factors: ?Sense of purpose and Communication skills ? ?Goals Addressed: ?Patient will: ? Reduce symptoms of: depression  ? Increase knowledge and/or ability of: stress reduction  ? Demonstrate ability to: Increase healthy adjustment to current life circumstances and Increase adequate support systems for patient/family ? ?Progress towards Goals: ?Ongoing ? ?Interventions: ?Interventions utilized:  Copywriter, advertising and CBT Cognitive Behavioral Therapy ?Standardized Assessments completed: Not Needed ? ?Patient and/or Family Response: Pt receptive to cognitive restructuring. Pt receptive to continuing daily self-care.  ? ?Patient Centered Plan: ?Patient is on the following  Treatment Plan(s): Depression ? ?Assessment: ?Denies SI/HI. Patient currently experiencing depression associated with marriage issues. Pt has been spending more time alone and focusing on herself.  ? ?Patient may benefit from continuing self-care. LCSW utilized Chartered certified accountant. LCSW encouraged pt to utilize daily self-care and incorporate positive affirmations. LCSW encouraged pt to utilize deep breathing exercises. LCSW informed pt of her departure from [facility]. LCSW will provide pt with resources.  ? ?Plan: ?Follow up with behavioral health clinician on : 01/14/22 ?Behavioral recommendations: Continue daily self-care and incorporate positive affirmation. ?Referral(s): Integrated [employer] (In Clinic) and Counselor ?"From scale of 1-10, how likely are you to follow plan?": 10 ? ?Erza Mothershead C Godwin Tedesco, LCSW ? ? ?

## 2022-01-14 ENCOUNTER — Ambulatory Visit (INDEPENDENT_AMBULATORY_CARE_PROVIDER_SITE_OTHER): Payer: Medicaid Other | Admitting: Clinical

## 2022-01-14 DIAGNOSIS — F331 Major depressive disorder, recurrent, moderate: Secondary | ICD-10-CM

## 2022-01-20 NOTE — BH Specialist Note (Signed)
Integrated Behavioral Health Follow Up In-Person Visit ? ?MRN: [id number] ?Name: Carrie Duncan ? ?Number of Integrated Behavioral Health Clinician visits: 3- Third Visit ? ?Session Start time: 1600 ?  ?Session End time: 1630 ? ?Total time in minutes: 30 ? ? ?Types of Service: Individual psychotherapy ? ?Interpretor:No.  Interpretor Name and Language: N/A (pt requested to not utilize an interpreter. States that she understands english) ? ?Subjective: ?Carrie Duncan is a 36 y.o. female accompanied by  son ?Patient was referred by Gwinda Passe, NP for depression. ?Patient reports the following symptoms/concerns: Reports that she continues to improve her mood. Reports that she limits her interactions with her spouse. Reports that she feels like she just has a roommate and not a spouse. Reports that she feels like they're only together because of the kids. Reports that she asks him to support her more with their children and that he has started doing this recently due to seeing her change.  ?Duration of problem: 1+ year; Severity of problem: moderate ? ?Objective: ?Mood: Anxious and Affect: Appropriate ?Risk of harm to self or others: No plan to harm self or others ? ?Life Context: ?Family and Social: Pt is married with four children.  ?School/Work: Pt is employed.  ?Self-Care: Pt has been utilizing meditation, going for walks, and spending time in the park.   ?Life Changes: Pt's spouse has been cheating on her. ?(No changes to life context) ? ?Patient and/or Family's Strengths/Protective Factors: ?Sense of purpose and communication skills ? ?Goals Addressed: ?Patient will: ? Reduce symptoms of: depression  ? Increase knowledge and/or ability of: stress reduction  ? Demonstrate ability to: Increase healthy adjustment to current life circumstances and Increase adequate support systems for patient/family ? ?Progress towards Goals: ?Ongoing ? ?Interventions: ?Interventions utilized:  Set designer and CBT Cognitive Behavioral Therapy ?Standardized Assessments completed: Not Needed ? ?Patient and/or Family Response: Pt receptive to affirmation for pt's progress. Pt will continue engaging in healthy coping skills.  ? ?Patient Centered Plan: ?Patient is on the following Treatment Plan(s): Depression ? ?Assessment: ?Denies SI/HI. Patient currently experiencing depression associated with marriage problems. Pt's mood is improving as she continues to spend time with herself. Pt has also been establishing boundaries in her marriage.  ? ?Patient may benefit from outpatient therapy. LCSW provided affirmation for pt's progress. LCSW encouraged pt to continue engaging in healthy coping skills with deep breathing, going for walks, and spending time in the park. LCSW provided pt with counseling resources and will refer pt for therapy. ? ?Plan: ?Follow up with behavioral health clinician on : Await contact from referral source for therapy.  ?Behavioral recommendations: Continue healthy coping skills and setting boundaries with spouse.  ?Referral(s): Counselor ?"From scale of 1-10, how likely are you to follow plan?": 10 ? ?Carrie Pavlicek C Taneesha Edgin, LCSW ? ? ?

## 2022-12-07 ENCOUNTER — Ambulatory Visit (HOSPITAL_COMMUNITY)
Admission: EM | Admit: 2022-12-07 | Discharge: 2022-12-07 | Disposition: A | Discharge status: Home / Self Care | Payer: Medicaid Other | Attending: Family Medicine | Admitting: Family Medicine

## 2022-12-07 ENCOUNTER — Encounter (HOSPITAL_COMMUNITY): Payer: Self-pay | Admitting: Emergency Medicine

## 2022-12-07 ENCOUNTER — Other Ambulatory Visit: Payer: Self-pay

## 2022-12-07 DIAGNOSIS — J069 Acute upper respiratory infection, unspecified: Secondary | ICD-10-CM | POA: Diagnosis not present

## 2022-12-07 DIAGNOSIS — R519 Headache, unspecified: Secondary | ICD-10-CM | POA: Diagnosis not present

## 2022-12-07 MED ORDER — BENZONATATE 100 MG PO CAPS: 100.0000 mg | ORAL_CAPSULE | Freq: Three times a day (TID) | ORAL | 0 refills | Status: DC

## 2022-12-07 MED ORDER — IBUPROFEN 800 MG PO TABS
ORAL_TABLET | ORAL | Status: AC
  Filled 2022-12-07: qty 1

## 2022-12-07 MED ORDER — IBUPROFEN 800 MG PO TABS
800.0000 mg | ORAL_TABLET | Freq: Once | ORAL | Status: AC
  Administered 2022-12-07: 800 mg via ORAL

## 2022-12-07 NOTE — ED Triage Notes (Signed)
Pt reports Sunday started feeling bad with headache, fatigue, vomiting, chills. Taken tylenol

## 2022-12-07 NOTE — ED Provider Notes (Signed)
[place]    CSN: [id number] Arrival date & time: 12/07/22  1149      History   Chief Complaint Chief Complaint  Patient presents with   Chills   Fatigue   Emesis    HPI Carrie Duncan is a 37 y.o. female.   Patient presents to urgent care for evaluation of cough, nasal congestion, sore throat, nausea, and vomiting that started 2 days ago on Sunday, December 05, 2022.  Last episode of emesis was when symptoms first started.  She has not thrown up since and is not currently nauseous.  Denies diarrhea.  Reports generalized headache without photophobia, phonophobia, blurry vision, decreased visual acuity, or dizziness.  Denies chest pain, shortness of breath, heart palpitations, abdominal pain, and rash.  Denies changes in dietary habits.  She is not a smoker and denies drug use.  Denies history of chronic respiratory problems.  She has an IUD and states that she is not sexually active, therefore denies chance of pregnancy.  She has been taking Tylenol at home for her headache and this has not helped very much.  No recent known sick contacts with similar symptoms. Reports chills at home without known documented fever. Currently afebrile without antipyretic in her system.   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   Language barrier 11/21/2018   Essential hypertension    Sickle cell trait ([place]) 11/30/2012    Past Surgical History:  Procedure Laterality Date   CESAREAN SECTION N/A 01/28/2019   Procedure: CESAREAN SECTION;  Surgeon: Chancy Milroy, MD;  Location: [facility];  Service: Obstetrics;  Laterality: N/A;   MULTIPLE TOOTH EXTRACTIONS     thumbnail removal Right 09/2017    OB History      Gravida  5   Para  5   Term  3   Preterm  2   AB  0   Living  3      SAB      IAB  0   Ectopic      Multiple  0   Live Births  4            Home Medications    Prior to Admission medications   Medication Sig Start Date End Date Taking? Authorizing Provider  benzonatate (TESSALON) 100 MG capsule Take 1 capsule (100 mg total) by mouth every 8 (eight) hours. 12/07/22  Yes Talbot Grumbling, FNP  amLODipine (NORVASC) 10 MG tablet Take 1 tablet (10 mg total) by mouth daily. 10/26/21   Kerin Perna, NP  enalapril (VASOTEC) 5 MG tablet Take 1 tablet (5 mg total) by mouth daily. 10/26/21   Kerin Perna, NP  naproxen (NAPROSYN) 500 MG tablet Take 1 tablet (500 mg total) by mouth 2 (two) times daily with a meal. 10/09/21   Jaynee Eagles, PA-C  tiZANidine (ZANAFLEX) 4 MG tablet Take 1 tablet (4 mg total) by mouth at bedtime. 10/09/21   Jaynee Eagles, PA-C    Family History Family History  Problem Relation Age of Onset   Hypertension Father    Vision loss Father    Arthritis Mother        knees  Social History Social History   Tobacco Use   Smoking status: Never   Smokeless tobacco: Never  Vaping Use   Vaping Use: Never used  Substance Use Topics   Alcohol use: No   Drug use: No     Allergies   Patient has no known allergies.   Review of Systems Review of Systems  Gastrointestinal:  Positive for vomiting.  Per HPI   Physical Exam Triage Vital Signs ED Triage Vitals  Enc Vitals Group     BP 12/07/22 1323 139/86     Pulse Rate 12/07/22 1323 65     Resp 12/07/22 1323 17     Temp 12/07/22 1323 98.6 F (37 C)     Temp Source 12/07/22 1323 Oral     SpO2 12/07/22 1323 98 %     Weight --      Height --      Head Circumference --      Peak Flow --      Pain Score 12/07/22 1322 6     Pain Loc --      Pain Edu? --      Excl. in [place]? --    No data found.  Updated Vital Signs BP 139/86 (BP Location: Right Arm)   Pulse 65   Temp 98.6  F (37 C) (Oral)   Resp 17   SpO2 98%   Visual Acuity Right Eye Distance:   Left Eye Distance:   Bilateral Distance:    Right Eye Near:   Left Eye Near:    Bilateral Near:     Physical Exam Vitals and nursing note reviewed.  Constitutional:      Appearance: She is not ill-appearing or toxic-appearing.  HENT:     Head: Normocephalic and atraumatic.     Right Ear: Hearing, tympanic membrane, ear canal and external ear normal.     Left Ear: Hearing, tympanic membrane, ear canal and external ear normal.     Nose: Congestion present.     Mouth/Throat:     Lips: Pink.     Mouth: Mucous membranes are moist. No injury.     Tongue: No lesions. Tongue does not deviate from midline.     Palate: No mass and lesions.     Pharynx: Oropharynx is clear. Uvula midline. No pharyngeal swelling, oropharyngeal exudate, posterior oropharyngeal erythema or uvula swelling.     Tonsils: No tonsillar exudate or tonsillar abscesses.  Eyes:     General: Lids are normal. Vision grossly intact. Gaze aligned appropriately.     Extraocular Movements: Extraocular movements intact.     Conjunctiva/sclera: Conjunctivae normal.  Cardiovascular:     Rate and Rhythm: Normal rate and regular rhythm.     Heart sounds: Normal heart sounds, S1 normal and S2 normal.  Pulmonary:     Effort: Pulmonary effort is normal. No respiratory distress.     Breath sounds: Normal breath sounds and air entry.  Abdominal:     General: Abdomen is flat. Bowel sounds are normal.     Palpations: Abdomen is soft.     Tenderness: There is no abdominal tenderness. There is no right CVA tenderness or left CVA tenderness.  Musculoskeletal:     Cervical back: Neck supple.  Lymphadenopathy:     Cervical: No cervical adenopathy.  Skin:    General: Skin is warm and dry.     Capillary Refill: Capillary refill takes less than 2 seconds.     Findings: No rash.  Neurological:  General: No focal deficit present.     Mental Status: She  is alert and oriented to person, place, and time. Mental status is at baseline.     Cranial Nerves: No dysarthria or facial asymmetry.  Psychiatric:        Mood and Affect: Mood normal.        Speech: Speech normal.        Behavior: Behavior normal.        Thought Content: Thought content normal.        Judgment: Judgment normal.      UC Treatments / Results  Labs (all labs ordered are listed, but only abnormal results are displayed) Labs Reviewed - No data to display  EKG   Radiology No results found.  Procedures Procedures (including critical care time)  Medications Ordered in UC Medications  ibuprofen (ADVIL) tablet 800 mg (800 mg Oral Given 12/07/22 1349)    Initial Impression / Assessment and Plan / UC Course  I have reviewed the triage vital signs and the nursing notes.  Pertinent labs & imaging results that were available during my care of the patient were reviewed by me and considered in my medical decision making (see chart for details).   1. Viral URI with cough, bad headache Symptoms and physical exam consistent with a viral upper respiratory tract infection that will likely resolve with rest, fluids, and prescriptions for symptomatic relief. Deferred imaging based on stable cardiopulmonary exam and hemodynamically stable vital signs. Deferred viral testing as   Patient given '800mg'$  ibuprofen in clinic today for headache.   Tessalon perles sent to pharmacy for symptomatic relief to be taken as prescribed.  May continue taking over the counter medications as directed for further symptomatic relief. Nonpharmacologic interventions for symptom relief provided and after visit summary below. Advised to push fluids to stay well hydrated while recovering from viral illness.   Discussed physical exam and available lab work findings in clinic with patient.  Counseled patient regarding appropriate use of medications and potential side effects for all medications recommended or  prescribed today. Discussed red flag signs and symptoms of worsening condition,when to call the PCP office, return to urgent care, and when to seek higher level of care in the emergency department. Patient verbalizes understanding and agreement with plan. All questions answered. Patient discharged in stable condition.   Final Clinical Impressions(s) / UC Diagnoses   Final diagnoses:  Viral URI with cough  Bad headache     Discharge Instructions      You have a viral upper respiratory infection.   Use the following medicines to help with symptoms: - Plain Mucinex (guaifenesin) over the counter as directed every 12 hours to thin mucous so that you are able to get it out of your body easier. Drink plenty of water while taking this medication so that it works well in your body (at least 8 cups a day).  - Tylenol 1,'000mg'$  and/or ibuprofen '600mg'$  every 6 hours with food as needed for aches/pains or fever/chills.  - Tessalon perles every 8 hours as needed for cough.  1 tablespoon of honey in warm water and/or salt water gargles may also help with symptoms. Humidifier to your room will help add water to the air and reduce coughing.  If you develop any new or worsening symptoms, please return.  If your symptoms are severe, please go to the emergency room.  Follow-up with your primary care provider for further evaluation and management of your symptoms as well  as ongoing wellness visits.  I hope you feel better!     ED Prescriptions     Medication Sig Dispense Auth. Provider   benzonatate (TESSALON) 100 MG capsule Take 1 capsule (100 mg total) by mouth every 8 (eight) hours. 21 capsule Talbot Grumbling, FNP      PDMP not reviewed this encounter.   Talbot Grumbling, [place] 12/07/22 1351

## 2022-12-07 NOTE — Discharge Instructions (Addendum)
You have a viral upper respiratory infection.   Use the following medicines to help with symptoms: - Plain Mucinex (guaifenesin) over the counter as directed every 12 hours to thin mucous so that you are able to get it out of your body easier. Drink plenty of water while taking this medication so that it works well in your body (at least 8 cups a day).  - Tylenol 1,000mg and/or ibuprofen 600mg every 6 hours with food as needed for aches/pains or fever/chills.  - Tessalon perles every 8 hours as needed for cough.  1 tablespoon of honey in warm water and/or salt water gargles may also help with symptoms. Humidifier to your room will help add water to the air and reduce coughing.  If you develop any new or worsening symptoms, please return.  If your symptoms are severe, please go to the emergency room.  Follow-up with your primary care provider for further evaluation and management of your symptoms as well as ongoing wellness visits.  I hope you feel better!  

## 2023-05-14 ENCOUNTER — Encounter (HOSPITAL_COMMUNITY): Payer: Self-pay

## 2023-05-14 ENCOUNTER — Ambulatory Visit (HOSPITAL_COMMUNITY)
Admission: EM | Admit: 2023-05-14 | Discharge: 2023-05-14 | Disposition: A | Discharge status: Home / Self Care | Payer: Medicaid Other | Attending: Nurse Practitioner | Admitting: Nurse Practitioner

## 2023-05-14 DIAGNOSIS — I10 Essential (primary) hypertension: Secondary | ICD-10-CM | POA: Diagnosis not present

## 2023-05-14 DIAGNOSIS — T7491XA Unspecified adult maltreatment, confirmed, initial encounter: Secondary | ICD-10-CM | POA: Diagnosis not present

## 2023-05-14 DIAGNOSIS — R519 Headache, unspecified: Secondary | ICD-10-CM

## 2023-05-14 MED ORDER — ENALAPRIL MALEATE 5 MG PO TABS: 5.0000 mg | ORAL_TABLET | Freq: Every day | ORAL | 0 refills | Status: DC

## 2023-05-14 NOTE — ED Triage Notes (Signed)
History from [facility] C/O headache and high BP.
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
## 2023-05-14 NOTE — Discharge Instructions (Addendum)
Please continue taking the amlodipine and start taking the Vasotec as well to treat you blood pressure.  Continue drinking plenty of water.  If you develop chest pain or shortness of breath, go to the Emergency Room.  Call your PCP to make an appointment ASAP.

## 2023-05-16 ENCOUNTER — Telehealth (INDEPENDENT_AMBULATORY_CARE_PROVIDER_SITE_OTHER): Payer: Self-pay | Admitting: Primary Care

## 2023-05-16 NOTE — Telephone Encounter (Signed)
Spoke to pt. I was able to follow up and schedule an apt with pt.

## 2023-05-25 ENCOUNTER — Ambulatory Visit (INDEPENDENT_AMBULATORY_CARE_PROVIDER_SITE_OTHER): Payer: Medicaid Other | Admitting: Primary Care

## 2023-05-25 ENCOUNTER — Telehealth: Payer: Self-pay

## 2023-05-25 VITALS — BP 135/85 | HR 82 | Resp 16 | Ht 61.0 in | Wt 128.0 lb

## 2023-05-25 DIAGNOSIS — I10 Essential (primary) hypertension: Secondary | ICD-10-CM

## 2023-05-25 DIAGNOSIS — T7491XS Unspecified adult maltreatment, confirmed, sequela: Secondary | ICD-10-CM

## 2023-05-25 DIAGNOSIS — Z603 Acculturation difficulty: Secondary | ICD-10-CM | POA: Diagnosis not present

## 2023-05-25 DIAGNOSIS — Z09 Encounter for follow-up examination after completed treatment for conditions other than malignant neoplasm: Secondary | ICD-10-CM

## 2023-05-25 DIAGNOSIS — Z758 Other problems related to medical facilities and other health care: Secondary | ICD-10-CM

## 2023-05-25 MED ORDER — ENALAPRIL MALEATE 5 MG PO TABS: 5.0000 mg | ORAL_TABLET | Freq: Every day | ORAL | 1 refills | Status: DC

## 2023-05-25 MED ORDER — AMLODIPINE BESYLATE 10 MG PO TABS: 10.0000 mg | ORAL_TABLET | Freq: Every day | ORAL | 1 refills | Status: DC

## 2023-05-25 NOTE — Patient Instructions (Signed)
[facility] [address]  [phone]- explain legal     [facility] the [place]  [phone]
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
## 2023-06-02 ENCOUNTER — Inpatient Hospital Stay (INDEPENDENT_AMBULATORY_CARE_PROVIDER_SITE_OTHER): Payer: Medicaid Other | Admitting: Primary Care

## 2023-07-14 ENCOUNTER — Ambulatory Visit (INDEPENDENT_AMBULATORY_CARE_PROVIDER_SITE_OTHER): Payer: Medicaid Other | Admitting: Primary Care

## 2023-07-25 ENCOUNTER — Ambulatory Visit (INDEPENDENT_AMBULATORY_CARE_PROVIDER_SITE_OTHER): Payer: Medicaid Other | Admitting: Primary Care

## 2023-07-26 ENCOUNTER — Ambulatory Visit (INDEPENDENT_AMBULATORY_CARE_PROVIDER_SITE_OTHER): Payer: Medicaid Other

## 2023-07-26 VITALS — BP 135/85

## 2023-07-26 DIAGNOSIS — Z0130 Encounter for examination of blood pressure without abnormal findings: Secondary | ICD-10-CM

## 2023-07-26 NOTE — Progress Notes (Signed)
Blood Pressure Recheck Visit  Name: Carrie Duncan MRN: [id number] Date of Birth: 04/14/1986  Carrie Duncan presents today for Blood Pressure recheck with clinical support staff.    BP Readings from Last 3 Encounters:  07/26/23 135/85  05/25/23 135/85  05/14/23 (!) 153/108    Current Outpatient Medications  Medication Sig Dispense Refill   amLODipine (NORVASC) 10 MG tablet Take 1 tablet (10 mg total) by mouth daily. 90 tablet 1   enalapril (VASOTEC) 5 MG tablet Take 1 tablet (5 mg total) by mouth daily. 90 tablet 1   No current facility-administered medications for this visit.    Hypertensive Medication Review: Patient states that they are taking all their hypertensive medications as prescribed and their last dose of hypertensive medications was this morning   Documentation of any medication adherence discrepancies: none  Provider Recommendation:  Spoke to [facility] and they stated: bp at last visit was better continue medication and will see her at follow up   Patient has been scheduled to follow up with [facility]. Carrie Duncan will reach out to pt and schedule a follow up appt    Patient has been given provider's recommendations and does not have any questions or concerns at this time. Patient will contact the office for any future questions or concerns.

## 2023-10-04 ENCOUNTER — Encounter: Payer: Self-pay | Admitting: *Deleted

## 2023-10-04 NOTE — Congregational Nurse Program (Signed)
  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 Yes    Student Assistance N/A    Location Patient Served  [facility]    Encounter Setting CN site    Population Status Migrant/Refugee    Insurance Medicaid    Insurance/Financial Assistance Referral N/A    Medication N/A    Medical Provider Yes    Screening Referrals Made N/A    Medical Referrals Made N/A    Medical Appointment Completed N/A    CNP Interventions Advocate/Support;Educate    Screenings CN Performed Blood Pressure    ED Visit Averted Yes    Life-Saving Intervention Made N/A            Client came into [facility] today for BP check.  160/98 HR 76  Client says she thought her pressure might be up today.  She says her home BP monitoring device is not working.  Encouraged client to check in with this CN weekly for BP check as desired.  Lugene Ropes, RN, MSN, CNP [phone] Office [phone] Cell

## 2023-11-01 ENCOUNTER — Encounter: Payer: Self-pay | Admitting: *Deleted

## 2023-11-08 NOTE — Congregational Nurse Program (Signed)
  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 Yes    Student Assistance N/A    Location Patient Served  [facility]    Encounter Setting CN site    Population Status Migrant/Refugee    Insurance Medicaid    Insurance/Financial Assistance Referral N/A    Medication N/A    Medical Provider Yes    Screening Referrals Made N/A    Medical Referrals Made N/A    Medical Appointment Completed N/A    CNP Interventions Advocate/Support;Educate    Screenings CN Performed Blood Pressure;Blood Glucose    ED Visit Averted Yes    Life-Saving Intervention Made N/A            Client came into [facility] for BP and glucose check.  BP 146/93 HR 71 and random glucose 112.  Client states no concerns today.  Client will follow up as needed with this CN.  Lugene Ropes, RN, MSN, CNP [phone] Office [phone] Cell

## 2023-12-03 ENCOUNTER — Other Ambulatory Visit (INDEPENDENT_AMBULATORY_CARE_PROVIDER_SITE_OTHER): Payer: Self-pay | Admitting: Primary Care

## 2023-12-03 DIAGNOSIS — I10 Essential (primary) hypertension: Secondary | ICD-10-CM

## 2023-12-05 ENCOUNTER — Other Ambulatory Visit (INDEPENDENT_AMBULATORY_CARE_PROVIDER_SITE_OTHER): Payer: Self-pay | Admitting: Primary Care

## 2023-12-05 ENCOUNTER — Other Ambulatory Visit (INDEPENDENT_AMBULATORY_CARE_PROVIDER_SITE_OTHER): Payer: Self-pay

## 2023-12-05 DIAGNOSIS — I10 Essential (primary) hypertension: Secondary | ICD-10-CM

## 2023-12-05 MED ORDER — AMLODIPINE BESYLATE 10 MG PO TABS: 10.0000 mg | ORAL_TABLET | Freq: Every day | ORAL | 0 refills | Status: DC

## 2023-12-05 MED ORDER — ENALAPRIL MALEATE 5 MG PO TABS: 5.0000 mg | ORAL_TABLET | Freq: Every day | ORAL | 0 refills | Status: DC

## 2024-02-10 ENCOUNTER — Encounter (INDEPENDENT_AMBULATORY_CARE_PROVIDER_SITE_OTHER): Payer: Self-pay | Admitting: Primary Care

## 2024-02-10 ENCOUNTER — Ambulatory Visit (INDEPENDENT_AMBULATORY_CARE_PROVIDER_SITE_OTHER): Admitting: Primary Care

## 2024-02-10 DIAGNOSIS — I10 Essential (primary) hypertension: Secondary | ICD-10-CM | POA: Diagnosis not present

## 2024-02-10 MED ORDER — ENALAPRIL MALEATE 5 MG PO TABS: 5.0000 mg | ORAL_TABLET | Freq: Every day | ORAL | 1 refills | Status: DC

## 2024-02-10 MED ORDER — AMLODIPINE BESYLATE 10 MG PO TABS: 10.0000 mg | ORAL_TABLET | Freq: Every day | ORAL | 1 refills | Status: DC

## 2024-02-10 NOTE — Patient Instructions (Signed)
 Plantar Fasciitis: What to Know  Your plantar fascia is a band of thick tissue on the bottom of your foot. It connects your heel bone to the base of your toes. If the fascia gets irritated, it can cause pain in your heel or foot. This is called plantar fasciitis. In some cases, plantar fasciitis can make it hard for you to walk or move. The pain is often worse in the morning after sleeping, or after sitting or lying down for a long time. Pain may also be worse after walking or standing for a long time. What are the causes? Plantar fasciitis may be caused by: Standing for a long time. Wearing shoes that don't have good arch support. Doing high-impact activities. These are things that put stress on your joints. They include: Ballet. Aerobic exercises. These are exercises that make your heart beat faster. Being overweight. Having a way of walking, or gait, that isn't normal. Tight muscles in your calf, which is in the back of your lower leg. High arches in your feet, or flat feet. Starting a new sport or activity. What are the signs or symptoms? The main symptom of plantar fasciitis is heel pain. Your pain may get worse after: You take your first steps after a time of rest. This includes in the morning after you wake up, or after you've been sitting or lying down for a while. Standing still for a long time. Pain may lessen after 30-45 minutes of activity, such as gentle walking. How is this diagnosed? Plantar fasciitis may be diagnosed based on your medical history, your symptoms, and an exam. Your health care provider will check for: A tender spot on the bottom of your foot. A high arch in your foot, or flat feet. Pain when you move your foot. Trouble moving your foot. You may also have tests. These may include: X-rays. Ultrasound. MRI. How is this treated? Treatment depends on how bad your plantar fasciitis is. It may include: RICE therapy. This stands for rest, ice, pressure  (compression), and raising (elevating) the foot. Exercises to stretch your calves and plantar fascia. A night splint. This holds your foot in a stretched, upward position while you sleep. Physical therapy. This can help with symptoms. It can also prevent problems in the future. Shots of a steroid medicine called cortisone. This can help with pain and irritation. Extracorporeal shock wave therapy. This uses electric shocks to stimulate your plantar fascia. If other treatments don't help, you may need to have surgery. Follow these instructions at home: Managing pain, stiffness, and swelling  Use ice, an ice pack, or a frozen bottle of water as told. Place a towel between your skin and the ice. Roll the bottom of your foot over the ice or frozen bottle. Do this for 20 minutes, 2-3 times a day. If your skin turns red, take off the ice right away to prevent skin damage. The risk of damage is higher if you can't feel pain, heat, or cold. Wear shoes that have air-sole or gel-sole cushions. You could also try soft shoe inserts made for plantar fasciitis. Activity Try not to do things that cause pain. Ask what things are safe for you to do. Exercise as told. Try activities that are low impact. This means that they're easier on your joints. They include: Swimming. Water aerobics. Biking. General instructions Take your medicines only as told. Wear a night splint as told. Loosen the splint if your toes tingle, are numb, or turn cold and blue.  Stay at a healthy weight. Work with your provider to lose weight as needed. Contact a health care provider if: Your symptoms don't go away with treatment. You have pain that gets worse. Your pain makes it hard to move or do everyday things. This information is not intended to replace advice given to you by your health care provider. Make sure you discuss any questions you have with your health care provider. Document Revised: 02/14/2023 Document Reviewed:  02/14/2023 Elsevier Patient Education  2024 [employer].

## 2024-02-13 NOTE — Progress Notes (Signed)
 [facility]  Carrie Duncan, is a 38 y.o. female  [id number]:[id number]  [id number]  DOB - September 11, 1986  Chief Complaint  Patient presents with   Pain Management    Pain in right foot, left side lower abdominal pain while sleeping like contractions and blurry vision in both eyes.       Subjective:   Carrie Duncan is a 38 y.o. female here today for a follow up visit. HTN-Patient has No headache, No chest pain, No abdominal pain - No Nausea, No new weakness tingling or numbness, No Cough - shortness of breath. She c/o Pain in right foot, left side lower abdominal pain while sleeping like contractions and blurry vision in both eyes.  No problems updated.  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   No current facility-administered medications on file prior to visit.   Health Maintenance  Topic Date Due   Hepatitis C Screening  Never done   COVID-19 Vaccine (1 - 2024-25 season) Never done   Flu Shot  04/27/2024   DTaP/Tdap/Td vaccine (3 - Td or Tdap) 12/19/2024   Pap with HPV screening  09/12/2025   HIV Screening  Completed   HPV Vaccine  Aged Out   Meningitis B Vaccine  Aged Out    Objective:   Vitals:   02/10/24 1111 02/10/24 1114 02/10/24 1142  BP: (!) 143/96 (!) 139/97 (!) 140/88  Pulse: 77    Resp: 15    SpO2: 100%    Weight: 133 lb 3.2 oz (60.4 kg)    Height: 5\' 3"  (1.6 m)     BP Readings from Last 3 Encounters:  02/10/24 (!) 140/88  11/01/23 (!) 146/93  10/04/23 (!) 160/98      Physical Exam Vitals reviewed.  Constitutional:      Appearance: Normal appearance.  HENT:     Head: Normocephalic.     Right Ear: Tympanic membrane,  ear canal and external ear normal.     Left Ear: Tympanic membrane, ear canal and external ear normal.     Nose: Nose normal.     Mouth/Throat:     Mouth: Mucous membranes are moist.  Eyes:     Extraocular Movements: Extraocular movements intact.     Pupils: Pupils are equal, round, and reactive to light.  Cardiovascular:     Rate and Rhythm: Normal rate.  Pulmonary:     Effort: Pulmonary effort is normal.     Breath sounds: Normal breath sounds.  Abdominal:     General: Bowel sounds are normal.     Palpations: Abdomen is soft.  Musculoskeletal:        General: Normal range of motion.     Cervical back: Normal range of motion.  Skin:    General: Skin is warm and dry.  Neurological:     Mental Status: She is alert and oriented to person, place, and time.  Psychiatric:        Mood and Affect: Mood normal.        Behavior: Behavior normal.        Thought Content: Thought content normal.       Assessment & Plan  Carrie Duncan was seen  today for pain management.  Diagnoses and all orders for this visit:  Essential hypertension BP goal - < 130/80 Explained that having normal blood pressure is the goal and medications are helping to get to goal and maintain normal blood pressure. DIET: Limit salt intake, read nutrition labels to check salt content, limit fried and high fatty foods  Avoid using multisymptom OTC cold preparations that generally contain sudafed which can rise BP. Consult with pharmacist on best cold relief products to use for persons with HTN EXERCISE Discussed incorporating exercise such as walking - 30 minutes most days of the week and can do in 10 minute intervals    -     amLODipine  (NORVASC ) 10 MG tablet; Take 1 tablet (10 mg total) by mouth daily.      Patient have been counseled extensively about nutrition and exercise. Other issues discussed during this visit include: low cholesterol diet, weight control and daily exercise, foot care, annual eye examinations  at Ophthalmology, importance of adherence with medications and regular follow-up. We also discussed long term complications of uncontrolled diabetes and hypertension.   Return today (on 02/10/2024).  The patient was given clear instructions to go to ER or return to medical center if symptoms don't improve, worsen or new problems develop. The patient verbalized understanding. The patient was told to call to get lab results if they haven't heard anything in the next week.   This note has been created with Education officer, environmental. Any transcriptional errors are unintentional.   Marius Siemens, NP 02/13/2024, 3:18 PM

## 2024-02-17 ENCOUNTER — Ambulatory Visit (INDEPENDENT_AMBULATORY_CARE_PROVIDER_SITE_OTHER)

## 2024-02-17 ENCOUNTER — Ambulatory Visit: Payer: Self-pay

## 2024-02-17 ENCOUNTER — Telehealth (INDEPENDENT_AMBULATORY_CARE_PROVIDER_SITE_OTHER): Payer: Self-pay | Admitting: Primary Care

## 2024-02-17 NOTE — Telephone Encounter (Signed)
  Chief Complaint: blurred vision Symptoms: blurred vision, comes and goes, sometimes unclear, feet pain as well, had HA but resolved right now Frequency: 1 month or more  Pertinent Negatives: Patient denies eye pain Disposition: [] ED /[] Urgent Care (no appt availability in office) / [x] Appointment(In office/virtual)/ []  [facility]/ [] Home Care/ [] Refused Recommended Disposition /[] [facility]/ []  Follow-up with PCP Additional Notes: scheduled pt for first available appt with PCP, advised if sx get worse can go to UC. Added appt to wait list.   Copied from CRM [id number]. Topic: Clinical - Red Word Triage >> Feb 17, 2024 11:39 AM Donald Frost wrote: Red Word that prompted transfer to Nurse Triage: The patient called in stating she has had vision issues the last couple of weeks. She states she cannot see clearly at times as it comes and goes. She also states she ahs been having pain in her right foot. I will transfer her to E2C2 NT Reason for Disposition  [1] Blurred vision or visual changes AND [2] gradual onset (e.g., weeks, months)  Answer Assessment - Initial Assessment Questions 1. DESCRIPTION: "How has your vision changed?" (e.g., complete vision loss, blurred vision, double vision, floaters, etc.)     Blurred vision  2. LOCATION: "One or both eyes?" If one, ask: "Which eye?"     Both eyes  4. ONSET: "When did this begin?" "Did it start suddenly or has this been gradual?"     Month or more 5. PATTERN: "Does this come and go, or has it been constant since it started?"     On and off  6. PAIN: "Is there any pain in your eye(s)?"  (Scale 1-10; or mild, moderate, severe)   - NONE (0): No pain.   - MILD (1-3): Doesn't interfere with normal activities.   - MODERATE (4-7): Interferes with normal activities or awakens from sleep.    - SEVERE (8-10): Excruciating pain, unable to do any normal activities.     In feet  9. OTHER SYMPTOMS: "Do you have any other symptoms?" (e.g.,  confusion, headache, arm or leg weakness, speech problems)     HA  Protocols used: Vision Loss or Change-A-AH

## 2024-02-17 NOTE — Telephone Encounter (Signed)
 Contacted pt 3x to resch her appt. cancel appt

## 2024-03-05 ENCOUNTER — Encounter (INDEPENDENT_AMBULATORY_CARE_PROVIDER_SITE_OTHER): Payer: Self-pay | Admitting: Primary Care

## 2024-03-05 ENCOUNTER — Ambulatory Visit (INDEPENDENT_AMBULATORY_CARE_PROVIDER_SITE_OTHER): Admitting: Primary Care

## 2024-03-05 VITALS — BP 138/83 | HR 80 | Resp 16 | Wt 132.8 lb

## 2024-03-05 DIAGNOSIS — H539 Unspecified visual disturbance: Secondary | ICD-10-CM

## 2024-03-05 DIAGNOSIS — I10 Essential (primary) hypertension: Secondary | ICD-10-CM | POA: Diagnosis not present

## 2024-03-05 DIAGNOSIS — L723 Sebaceous cyst: Secondary | ICD-10-CM

## 2024-03-05 DIAGNOSIS — M722 Plantar fascial fibromatosis: Secondary | ICD-10-CM | POA: Diagnosis not present

## 2024-03-05 MED ORDER — DOXYCYCLINE HYCLATE 100 MG PO TABS: 100.0000 mg | ORAL_TABLET | Freq: Two times a day (BID) | ORAL | 0 refills | Status: DC

## 2024-03-05 MED ORDER — FLUCONAZOLE 150 MG PO TABS: 150.0000 mg | ORAL_TABLET | Freq: Every day | ORAL | 1 refills | Status: DC

## 2024-03-05 NOTE — Progress Notes (Unsigned)
 [facility]  Torry Adamczak, is a 38 y.o. female  [id number]  IRJ:[id number]  DOB - 1986-09-24  Chief Complaint  Patient presents with   Foot Pain   Dizziness   Abdominal Pain    Left lower    Eye Problem       Subjective:   Carrie Duncan is a 38 y.o. female here today for an acute visit.  She is here today with concerns of dizziness and abdominal pain.  Question was she having dizzy spells prior to changing blood pressure medication-she said yes.  She has irregular menstrual cycles birth control IUD.  She can go without a menstrual cycle for 7 months and then start spotting.  Explained this is due to the birth control hormonal. She is also complaining of foot pain.  Last visit recommended cold frozen bottle rub under her feet little to no relief will refer to podiatry.  She also mention changes in vision but has never had a eye exam.   No problems updated.  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 90 tablet 1   enalapril  (VASOTEC ) 5 MG tablet Take 1 tablet (5 mg total) by mouth daily. 90 tablet 1   No current facility-administered medications on file prior to visit.   Health Maintenance  Topic Date Due   Hepatitis C Screening  Never done   COVID-19 Vaccine (1 - 2024-25 season) Never done   Flu Shot  04/27/2024   DTaP/Tdap/Td vaccine (3 - Td or Tdap) 12/19/2024   Pap with HPV screening  09/12/2025   HIV Screening  Completed   HPV Vaccine  Aged Out   Meningitis B Vaccine  Aged Out    Objective:   Vitals:   03/05/24 1501  BP:  138/83  Pulse: 80  Resp: 16  SpO2: 100%  Weight: 132 lb 12.8 oz (60.2 kg)   BP Readings from Last 3 Encounters:  03/05/24 138/83  02/10/24 (!) 140/88  11/01/23 (!) 146/93   Physical exam: General: Vital signs reviewed.  Patient is well-developed and well-nourished, xx in no acute distress and cooperative with exam. Head: Normocephalic and atraumatic. Eyes: EOMI, conjunctivae normal, no scleral icterus. Neck: Supple, trachea midline, normal ROM, no JVD, masses, thyromegaly, or carotid bruit present. Cardiovascular: RRR, S1 normal, S2 normal, no murmurs, gallops, or rubs. Pulmonary/Chest: Clear to auscultation bilaterally, no wheezes, rales, or rhonchi. Abdominal: Soft, non-tender, non-distended, BS +, no masses, organomegaly, or guarding present. Musculoskeletal: No joint deformities, erythema, or stiffness, ROM full and nontender. Extremities: No lower extremity edema bilaterally,  pulses symmetric and intact bilaterally. No cyanosis or clubbing. Neurological: A&O x3, Strength is normal Skin: Warm, dry and intact. No rashes or erythema. Psychiatric: Normal mood and affect. speech and behavior is normal. Cognition and memory are normal.    Assessment & Plan   There are no diagnoses linked to this encounter.   Patient have been counseled extensively about nutrition and exercise. Other issues discussed during this visit include: low cholesterol diet, weight control and daily exercise, foot care, annual eye examinations at  Ophthalmology, importance of adherence with medications and regular follow-up. We also discussed long term complications of uncontrolled diabetes and hypertension.   No follow-ups on file.  The patient was given clear instructions to go to ER or return to medical center if symptoms don't improve, worsen or new problems develop. The patient verbalized understanding. The patient was told to call to get lab results if they haven't heard anything in the next week.   This  note has been created with Education officer, environmental. Any transcriptional errors are unintentional.   Marius Siemens, NP 03/05/2024, 3:15 PM

## 2024-03-06 ENCOUNTER — Encounter (INDEPENDENT_AMBULATORY_CARE_PROVIDER_SITE_OTHER): Payer: Self-pay | Admitting: Primary Care

## 2024-03-08 ENCOUNTER — Encounter (INDEPENDENT_AMBULATORY_CARE_PROVIDER_SITE_OTHER): Payer: Self-pay | Admitting: Primary Care

## 2024-03-08 ENCOUNTER — Ambulatory Visit (INDEPENDENT_AMBULATORY_CARE_PROVIDER_SITE_OTHER): Admitting: Primary Care

## 2024-03-08 VITALS — BP 135/88 | HR 66 | Resp 16 | Wt 133.4 lb

## 2024-03-08 DIAGNOSIS — L723 Sebaceous cyst: Secondary | ICD-10-CM

## 2024-03-08 DIAGNOSIS — I10 Essential (primary) hypertension: Secondary | ICD-10-CM

## 2024-03-08 DIAGNOSIS — Z1159 Encounter for screening for other viral diseases: Secondary | ICD-10-CM

## 2024-03-08 DIAGNOSIS — Z13220 Encounter for screening for lipoid disorders: Secondary | ICD-10-CM

## 2024-03-08 LAB — WOUND CULTURE

## 2024-03-08 MED ORDER — KETOROLAC TROMETHAMINE 60 MG/2ML IM SOLN
60.0000 mg | Freq: Once | INTRAMUSCULAR | Status: AC
  Administered 2024-03-08: 60 mg via INTRAMUSCULAR

## 2024-03-08 NOTE — Progress Notes (Signed)
 [facility]  Merrill Deanda, is a 38 y.o. female  [id number]  YQM:[id number]  DOB - 1985-12-23  No chief complaint on file.      Subjective:   Ms. Carrie Duncan is a 38 y.o. female here today for an follow-up evaluation of incision and drainage of a right ancillary cyst.  Today her site is healing well no increase in swelling, redness or drainage.  She still has some pain when palpated/touched.  Size has decreased significantly she is still taking her antibiotics.  She is here today for removal of second right axilla cyst that is very painful and irritating.    No problems updated.  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 90 tablet 1   doxycycline (VIBRA-TABS) 100 MG tablet Take 1 tablet (100 mg total) by mouth 2 (two) times daily. 14 tablet 0   enalapril  (VASOTEC ) 5 MG tablet Take 1 tablet (5 mg total) by mouth daily. 90 tablet 1   fluconazole  (DIFLUCAN ) 150 MG tablet Take 1 tablet (150 mg total) by mouth daily. 1 tablet 1   No current facility-administered medications on file prior to visit.   Health Maintenance  Topic Date Due   HPV Vaccine (1 - 3-dose series) Never done   Hepatitis C Screening  Never done   COVID-19 Vaccine (1 - 2024-25 season) Never done   Flu Shot  04/27/2024   DTaP/Tdap/Td vaccine (3 - Td or Tdap) 12/19/2024   Pap with HPV screening  09/12/2025   HIV Screening  Completed   Meningitis B Vaccine  Aged Out    Objective:   Vitals:   03/08/24 0840  BP: 135/88  Pulse: 66  Resp: 16  SpO2: 100%  Weight: 133 lb  6.4 oz (60.5 kg)   BP Readings from Last 3 Encounters:  03/08/24 135/88  03/05/24 138/83  02/10/24 (!) 140/88   Physical exam: General: Vital signs reviewed.  Patient is well-developed and well-nourished, in no acute distress and cooperative with exam. Head: Normocephalic and atraumatic. Cardiovascular: RRR, S1 normal, S2 normal, no murmurs, gallops, or rubs. Pulmonary/Chest: Clear to auscultation bilaterally, no wheezes, rales, or rhonchi. Abdominal: Soft, non-tender, non-distended, BS +,  Musculoskeletal:  ROM full and nontender. Extremities: No lower extremity edema bilaterally Neurological: A&O x3, Strength is normal Skin: axilla cyst right arm  Psychiatric: Normal mood and affect. speech and behavior is normal. Cognition and memory are normal.    Assessment & Plan  Diagnoses and all orders for this visit:  Sebaceous cyst of axilla I&D  -     WOUND CULTURE Continue to take  doxycycline (VIBRA-TABS) 100 MG tablet; Take 1 tablet (100 mg total) by mouth 2 (two) times daily. Until gone.  -       1 week f/u   The patient was given clear instructions to go to ER or return to medical center if symptoms don't improve, worsen or new problems develop. The patient verbalized understanding. The patient was told to call to get lab results if they haven't heard anything in the next week.  This note has been created with Education officer, environmental. Any transcriptional errors are unintentional.   Marius Siemens, NP 03/08/2024, 9:31 AM

## 2024-03-09 ENCOUNTER — Ambulatory Visit (INDEPENDENT_AMBULATORY_CARE_PROVIDER_SITE_OTHER): Payer: Self-pay | Admitting: Primary Care

## 2024-03-09 LAB — CBC WITH DIFFERENTIAL/PLATELET
Basophils Absolute: 0 10*3/uL (ref 0.0–0.2)
Basos: 1 %
EOS (ABSOLUTE): 0.4 10*3/uL (ref 0.0–0.4)
Eos: 8 %
Hematocrit: 42.1 % (ref 34.0–46.6)
Hemoglobin: 13.2 g/dL (ref 11.1–15.9)
Immature Grans (Abs): 0 10*3/uL (ref 0.0–0.1)
Immature Granulocytes: 0 %
Lymphocytes Absolute: 1.9 10*3/uL (ref 0.7–3.1)
Lymphs: 38 %
MCH: 29.8 pg (ref 26.6–33.0)
MCHC: 31.4 g/dL — ABNORMAL LOW (ref 31.5–35.7)
MCV: 95 fL (ref 79–97)
Monocytes Absolute: 0.3 10*3/uL (ref 0.1–0.9)
Monocytes: 7 %
Neutrophils Absolute: 2.4 10*3/uL (ref 1.4–7.0)
Neutrophils: 46 %
Platelets: 173 10*3/uL (ref 150–450)
RBC: 4.43 x10E6/uL (ref 3.77–5.28)
RDW: 12.1 % (ref 11.7–15.4)
WBC: 5.1 10*3/uL (ref 3.4–10.8)

## 2024-03-09 LAB — CMP14+EGFR
ALT: 13 IU/L (ref 0–32)
AST: 17 IU/L (ref 0–40)
Albumin: 4.2 g/dL (ref 3.9–4.9)
Alkaline Phosphatase: 50 IU/L (ref 44–121)
BUN/Creatinine Ratio: 14 (ref 9–23)
BUN: 11 mg/dL (ref 6–20)
Bilirubin Total: 0.3 mg/dL (ref 0.0–1.2)
CO2: 22 mmol/L (ref 20–29)
Calcium: 9.1 mg/dL (ref 8.7–10.2)
Chloride: 104 mmol/L (ref 96–106)
Creatinine, Ser: 0.79 mg/dL (ref 0.57–1.00)
Globulin, Total: 2.7 g/dL (ref 1.5–4.5)
Glucose: 60 mg/dL — ABNORMAL LOW (ref 70–99)
Potassium: 3.8 mmol/L (ref 3.5–5.2)
Sodium: 141 mmol/L (ref 134–144)
Total Protein: 6.9 g/dL (ref 6.0–8.5)
eGFR: 99 mL/min/{1.73_m2} (ref >59)

## 2024-03-09 LAB — LIPID PANEL
Chol/HDL Ratio: 2.7 ratio (ref 0.0–4.4)
Cholesterol, Total: 139 mg/dL (ref 100–199)
HDL: 52 mg/dL (ref >39)
LDL Chol Calc (NIH): 77 mg/dL (ref 0–99)
Triglycerides: 46 mg/dL (ref 0–149)
VLDL Cholesterol Cal: 10 mg/dL (ref 5–40)

## 2024-03-09 LAB — HCV INTERPRETATION

## 2024-03-09 LAB — HCV AB W REFLEX TO QUANT PCR: HCV Ab: NONREACTIVE

## 2024-03-14 LAB — ANAEROBIC AND AEROBIC CULTURE

## 2024-03-16 ENCOUNTER — Ambulatory Visit (INDEPENDENT_AMBULATORY_CARE_PROVIDER_SITE_OTHER): Admitting: Primary Care

## 2024-03-16 ENCOUNTER — Encounter (INDEPENDENT_AMBULATORY_CARE_PROVIDER_SITE_OTHER): Payer: Self-pay | Admitting: Primary Care

## 2024-03-16 VITALS — BP 127/83 | HR 71 | Resp 16 | Wt 132.8 lb

## 2024-03-16 DIAGNOSIS — L723 Sebaceous cyst: Secondary | ICD-10-CM | POA: Diagnosis not present

## 2024-03-16 NOTE — Progress Notes (Addendum)
 [facility]  Carrie Duncan, is a 38 y.o. female  [id number]:[id number]  [id number]:[id number]  DOB - Dec 12, 1985  Chief Complaint  Patient presents with   Wound Check    Right axilla        Subjective:   Carrie Duncan is a 38 y.o. female here today for follow-up evaluation after removal of right ancillary cyst.  She is having no pain or signs and symptoms of infection completed antibiotic .  Site has healed and looks good   No problems updated.  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 90 tablet 1   doxycycline  (VIBRA -TABS) 100 MG tablet Take 1 tablet (100 mg total) by mouth 2 (two) times daily. (Patient not taking: Reported on 03/16/2024) 14 tablet 0   enalapril  (VASOTEC ) 5 MG tablet Take 1 tablet (5 mg total) by mouth daily. 90 tablet 1   fluconazole  (DIFLUCAN ) 150 MG tablet Take 1 tablet (150 mg total) by mouth daily. (Patient not taking: Reported on 03/16/2024) 1 tablet 1   No current facility-administered medications on file prior to visit.   Health Maintenance  Topic Date Due   HPV Vaccine (1 - 3-dose SCDM series) Never done   COVID-19 Vaccine (1 - 2024-25 season) Never done   Flu Shot  04/27/2024   DTaP/Tdap/Td vaccine (3 - Td or Tdap) 12/19/2024   Pap with HPV screening  09/12/2025   Hepatitis C Screening  Completed   HIV Screening  Completed   Meningitis B Vaccine  Aged Out    Objective:   Vitals:   03/16/24 0856  BP: 127/83  Pulse: 71  Resp: 16  SpO2: 100%  Weight: 132 lb 12.8 oz (60.2 kg)     Physical Exam Vitals reviewed.   HENT:     Head: Normocephalic.   Eyes:     General: Scleral icterus present.    Cardiovascular:     Rate and Rhythm: Normal rate and regular rhythm.  Pulmonary:     Effort: Pulmonary effort is normal.     Breath sounds: Normal breath sounds.  Abdominal:     General: Abdomen is flat.     Palpations: Abdomen is soft.   Musculoskeletal:     Cervical back: Normal range of motion.   Skin:    Comments: Site healed see picture   Neurological:     Mental Status: She is alert and oriented to person, place, and time.   Psychiatric:        Mood and Affect: Mood normal.        Behavior: Behavior normal.     Assessment & Plan  Carrie Duncan was seen today for wound check.  Diagnoses and all orders for this visit:  Sebaceous cyst of axilla Removal-      Patient have been counseled extensively about nutrition and exercise. Other issues discussed during this visit include: low cholesterol diet, weight control and daily exercise, foot care, annual eye examinations at Ophthalmology, importance of adherence with medications and regular follow-up. We also discussed long term complications of uncontrolled diabetes and hypertension.  Return in about 3 months (around 06/16/2024) for HTN.  The patient was given clear instructions to go to ER or return to medical center if symptoms don't improve, worsen or new problems develop. The patient verbalized understanding. The patient was told to call to get lab results if they haven't heard anything in the next week.   This note has been created with Education officer, environmental. Any transcriptional errors are unintentional.   Marius Siemens, NP 03/16/2024, 9:54 AM

## 2024-03-16 NOTE — Patient Instructions (Signed)
Community Resources  Advocacy/Legal Legal Aid [place]:  1-866-219-5262  /  336-272-0148  Family Justice Center:  336-641-7233  [facility] 24-hr Crisis line:  336-273-7273  Women's Resource Center, [place]:  336-275-6090  Court Watch (custody):  336-275-2346  [place] [facility]:   336-279-9299    Baby & Breastfeeding Car Seat Inspection @ Various [place] Fire Depts.- call 336-373-2177  [place] [facility]act[facility]  336-832-6860  [facility] 336-878-6712  WIC: 336-641-3663 ([place]);  336-641-7571 ([place])  La Leche League:  1-877-452-5321   Childcare [place] [facility]: 336-369-5097 ([place]) / 336-887-8224 ([place])  - Child Care Resources/ Referrals/ Scholarships  - Head Start/ Early Head Start (call or apply online)  [place] DHHS: [place] Pre-K :  1-800-859-0829 / 336-274-5437   Employment / Job Search [facility] [place]: 336-275-6090 / [address]  [place] [employer] (JobLink): 336-373-5922 ([place]) / 336-882-4141 ([place])  [facility] Community Resource/ Career Center: 336-275-9801 / 336-282-7307  [place] [facility] Job & Career Center: 336-373-3764  DHHS Work First: 336-641-3447 ([place]) / 336-641-3447 ([place])  [facility] [place]:  336-676-5871   Financial Assistance [place] [facility]:  336-553-2657  [employer]: 336-235-0368  [place] (furniture):  336-370-4002  [place] [facility]: 336-373-4264  Low Income Energy Assistance  336-641-3000   Food Assistance DHHS- SNAP/ Food Stamps: 336-641-4588  WIC: [place]- 336-641-3663 ;  [place] 336-641-7571  Little Green Book- Free Meals  Little Blue Book- Free Food Pantries  During the summer, text "FOOD" to 877877   General Health / Clinics (Adults) Orange Card (for Adults) through [facility]: (336) 895-4900  [facility]:   336-832-8035  [place] [facility]:   336-832-4444  Health Department:  336-641-3245  [facility]:  336-415-3877 / 336-641-2100  Planned [facility] of [place]:   336-373-0678  [facility]:   336-334-4822 x 50251   Housing [place] Housing Coalition:   336-691-9521  [place] Housing Authority:  336-275-8501  Affordable Housing Managemnt:  336-273-0568   Immigrant/ Refugee Center for [place]ians (UNCG):  336-256-1065  Faith [facility]:  336-379-0037  [employer] [facility]:  336-937-4701  [employer] World Services:  336-617-0381  African Services Coalition:  336-574-2677   LGBTQ YouthSAFE  www.youthsafe[place].org  PFLAG  336-541-6754 / info@[place].org  The Trevor Project:  1-866-488-7386   Mental Health/ Substance Use [facility]  336-387-6161  [facility]:  336-832-9700 or 1-800-711-2635  [facility] Circle of Care:  336-271-5888  [facility]:  336-294-1349  [facility]:  336-542-2884  [facility] (walk-ins)  336-676-6840 / [address]  [place]:  800-449-1287  Alcoholics Anonymous:  336-854-4278  Narcotics Anonymous:  800-365-1036  Quit Smoking Hotline:  800-QUIT-NOW (800-784-8669)   Parenting Children's Home Society:  800-632-1400  [place]: Education Center & Support Groups:  336-832-6682  YWCA: 336-273-3461  UNCG: Bringing Out the Best:  336-334-3120               Thriving at Three (Hispanic families): 336-256-1066  Healthy Start ([facility]):  336-387-6161 x2288  Parents as Teachers:  336-691-0024  [place] [facility]- Learning Together (Immigrants): 336-369-5001   Poison Control 800-222-1222  Sports & Recreation [employer] Open Doors Application: ymcanwnc.org/join/open-doors-financial-assistance/  City of [place] Recreation Centers: http://www.[place].gov/index.aspx?page=3615   Special Needs Family Support Network:  336-832-6507  Autism Society of [place]:   336-333-0197 x1402 or x1412 /  800-785-1035  TEACCH :  336-334-5773     ARC of [place]:  336-373-1076  Children's Developmental Service Agency (CDSA):  336-334-5601  CC4C (Care Coordination for Children):  336-641-7641   Transportation Medicaid Transportation: 336-641-4848 to apply  [place] Transit Authority: 336-335-6499 (reduced-fare bus ID to Medicaid/ Medicare/ Orange Card)  SCAT Paratransit services: Eligible riders only, call 336-333-6589 for application   Tutoring/Mentoring Black Child Development Institute: 336-230-2138  Big Brothers/ Big Sisters: 336-378-9100 ([place])  336-882-4167 ([place])  ACES through child's school: 336-370-2321  [employer] Achievers: contact your local Y  SHIELD Mentor Program: 336-337-2771   

## 2024-06-15 ENCOUNTER — Telehealth: Payer: Self-pay | Admitting: Primary Care

## 2024-06-15 NOTE — Telephone Encounter (Signed)
 Error

## 2024-06-22 ENCOUNTER — Ambulatory Visit (INDEPENDENT_AMBULATORY_CARE_PROVIDER_SITE_OTHER): Admitting: Primary Care

## 2024-07-10 ENCOUNTER — Encounter (INDEPENDENT_AMBULATORY_CARE_PROVIDER_SITE_OTHER): Payer: Self-pay | Admitting: Primary Care

## 2024-07-10 ENCOUNTER — Ambulatory Visit (INDEPENDENT_AMBULATORY_CARE_PROVIDER_SITE_OTHER): Admitting: Primary Care

## 2024-07-10 VITALS — BP 131/89 | HR 67 | Resp 16 | Wt 136.2 lb

## 2024-07-10 DIAGNOSIS — Z23 Encounter for immunization: Secondary | ICD-10-CM | POA: Diagnosis not present

## 2024-07-10 DIAGNOSIS — I10 Essential (primary) hypertension: Secondary | ICD-10-CM

## 2024-07-10 DIAGNOSIS — Z760 Encounter for issue of repeat prescription: Secondary | ICD-10-CM

## 2024-07-10 MED ORDER — ENALAPRIL MALEATE 5 MG PO TABS: 5.0000 mg | ORAL_TABLET | Freq: Every day | ORAL | 1 refills | Status: AC

## 2024-07-10 MED ORDER — AMLODIPINE BESYLATE 10 MG PO TABS: 10.0000 mg | ORAL_TABLET | Freq: Every day | ORAL | 1 refills | Status: AC
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
## 2024-07-10 NOTE — Patient Instructions (Signed)

## 2024-10-10 ENCOUNTER — Ambulatory Visit (INDEPENDENT_AMBULATORY_CARE_PROVIDER_SITE_OTHER): Admitting: Primary Care

## 2024-11-07 ENCOUNTER — Ambulatory Visit (INDEPENDENT_AMBULATORY_CARE_PROVIDER_SITE_OTHER): Admitting: Primary Care
